# Patient Record
Sex: Female | Born: 1960
Health system: Southern US, Community
[De-identification: ages and names within clinical notes are randomized; demographics above are authoritative.]

## PROBLEM LIST (undated history)

## (undated) DIAGNOSIS — D509 Iron deficiency anemia, unspecified: Secondary | ICD-10-CM

## (undated) DIAGNOSIS — I6521 Occlusion and stenosis of right carotid artery: Secondary | ICD-10-CM

## (undated) DIAGNOSIS — C569 Malignant neoplasm of unspecified ovary: Secondary | ICD-10-CM

## (undated) DIAGNOSIS — E785 Hyperlipidemia, unspecified: Secondary | ICD-10-CM

## (undated) DIAGNOSIS — I10 Essential (primary) hypertension: Secondary | ICD-10-CM

## (undated) DIAGNOSIS — I8290 Acute embolism and thrombosis of unspecified vein: Secondary | ICD-10-CM

## (undated) DIAGNOSIS — J45909 Unspecified asthma, uncomplicated: Secondary | ICD-10-CM

## (undated) HISTORY — DX: Malignant neoplasm of unspecified ovary: C56.9

## (undated) HISTORY — DX: Essential (primary) hypertension: I10

## (undated) HISTORY — DX: Acute embolism and thrombosis of unspecified vein: I82.90

## (undated) HISTORY — DX: Iron deficiency anemia, unspecified: D50.9

## (undated) HISTORY — PX: TOTAL ABDOMINAL HYSTERECTOMY W/ BILATERAL SALPINGOOPHORECTOMY: SHX83

## (undated) HISTORY — PX: ILEOSTOMY: SHX1783

## (undated) HISTORY — DX: Hypomagnesemia: E83.42

## (undated) HISTORY — DX: Unspecified asthma, uncomplicated: J45.909

## (undated) HISTORY — PX: APPENDECTOMY: SHX54

## (undated) HISTORY — DX: Hyperlipidemia, unspecified: E78.5

## (undated) HISTORY — DX: Occlusion and stenosis of right carotid artery: I65.21

## (undated) HISTORY — PX: ABDOMINAL HYSTERECTOMY: SHX81

---

## 1997-12-24 ENCOUNTER — Other Ambulatory Visit: Admission: RE | Admit: 1997-12-24 | Discharge: 1997-12-24 | Payer: Self-pay | Admitting: Obstetrics and Gynecology

## 1998-12-29 ENCOUNTER — Other Ambulatory Visit: Admission: RE | Admit: 1998-12-29 | Discharge: 1998-12-29 | Payer: Self-pay | Admitting: Obstetrics and Gynecology

## 2000-02-28 ENCOUNTER — Other Ambulatory Visit: Admission: RE | Admit: 2000-02-28 | Discharge: 2000-02-28 | Payer: Self-pay | Admitting: Obstetrics and Gynecology

## 2005-11-03 ENCOUNTER — Emergency Department (HOSPITAL_COMMUNITY): Admission: EM | Admit: 2005-11-03 | Discharge: 2005-11-03 | Payer: Self-pay | Admitting: Emergency Medicine

## 2005-11-18 ENCOUNTER — Encounter: Admission: RE | Admit: 2005-11-18 | Discharge: 2005-11-18 | Payer: Self-pay | Admitting: Cardiovascular Disease

## 2010-01-08 ENCOUNTER — Ambulatory Visit: Payer: Self-pay | Admitting: Family Medicine

## 2010-01-08 ENCOUNTER — Ambulatory Visit: Payer: Self-pay | Admitting: Gynecologic Oncology

## 2010-01-12 ENCOUNTER — Ambulatory Visit: Payer: Self-pay | Admitting: Gynecologic Oncology

## 2010-02-08 ENCOUNTER — Ambulatory Visit: Payer: Self-pay | Admitting: Gynecologic Oncology

## 2010-09-20 IMAGING — CT CT ABD-PELV W/ CM
1 of 3 series · 13 of 32 positions shown, 19 images · non-contrast
Comparison: none

REASON FOR EXAM: CHRONIC ABD PAIN UTERINE MASS FOR STAGING CA
COMMENTS:

PROCEDURE:     CT  - CT ABDOMEN / PELVIS  W  - January 08, 2010  [DATE]
RESULT:     History: Pain.
Comparison Study: No prior.

[Series 2: soft tissue · axial · 0.77mm/px · z∈[-536,-108]mm · 13 of 165 slices shown, 19 images]
[im 11/165  soft-tissue]
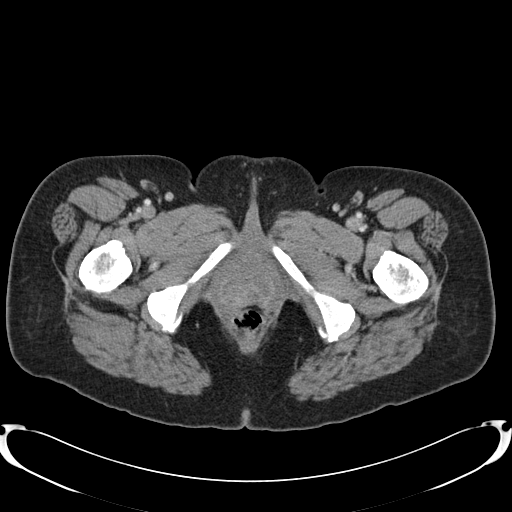
[im 11/165  bone]
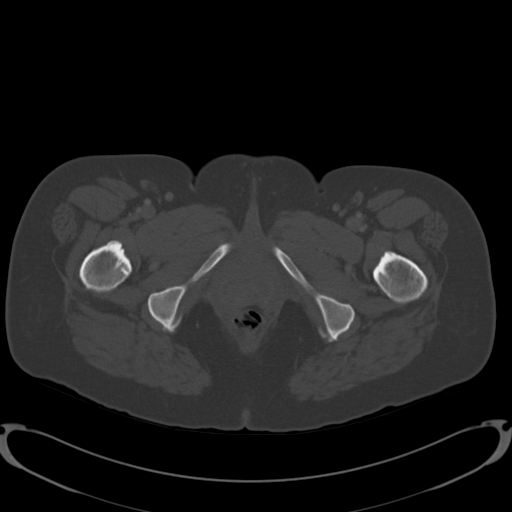
[im 22/165  soft-tissue]
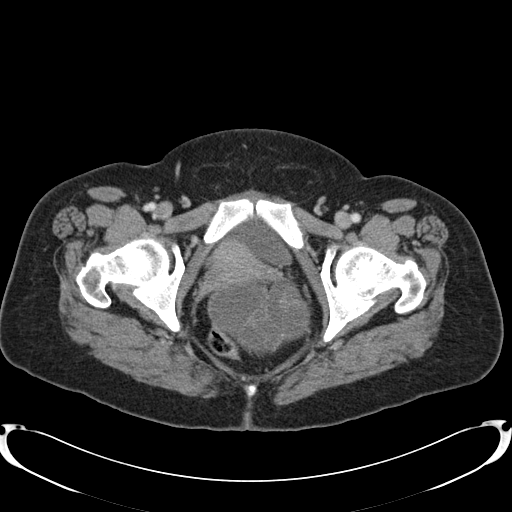
[im 33/165  soft-tissue]
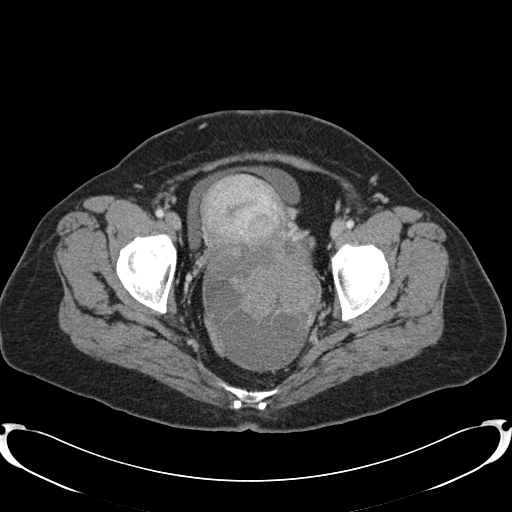
[im 44/165  soft-tissue]
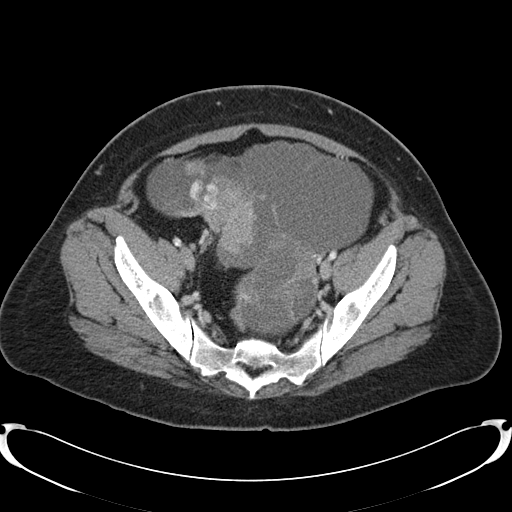
[im 55/165  soft-tissue]
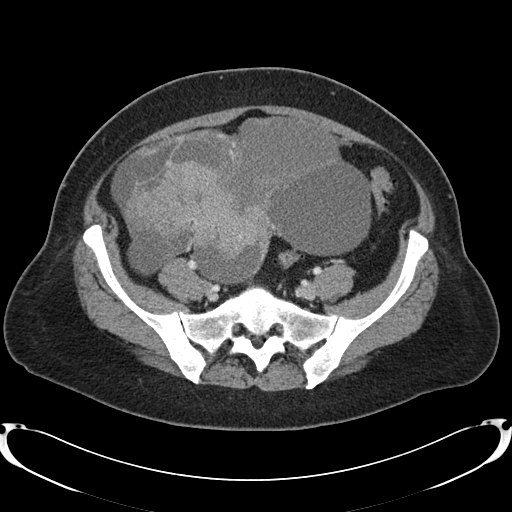
[im 66/165  soft-tissue]
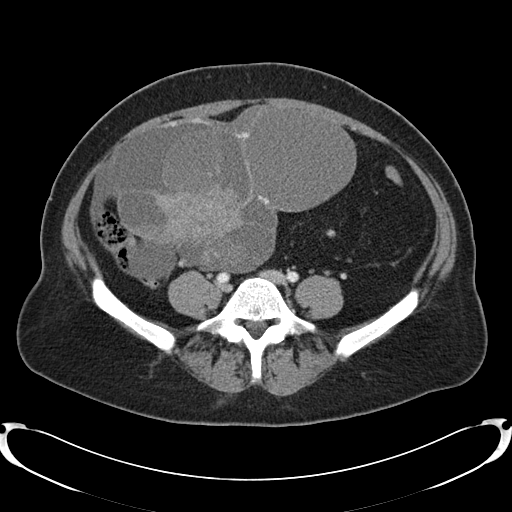
[im 88/165  soft-tissue]
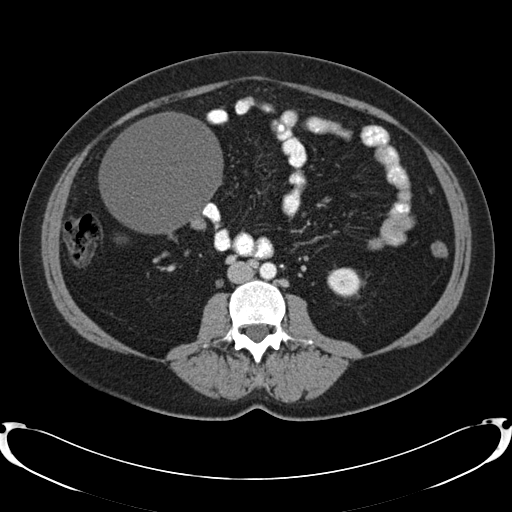
[im 99/165  soft-tissue]
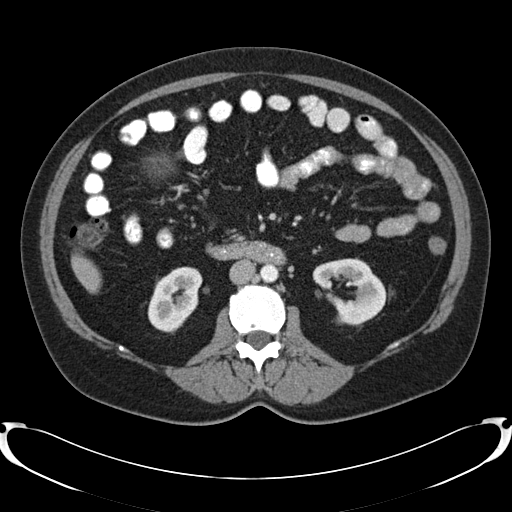
[im 110/165  soft-tissue]
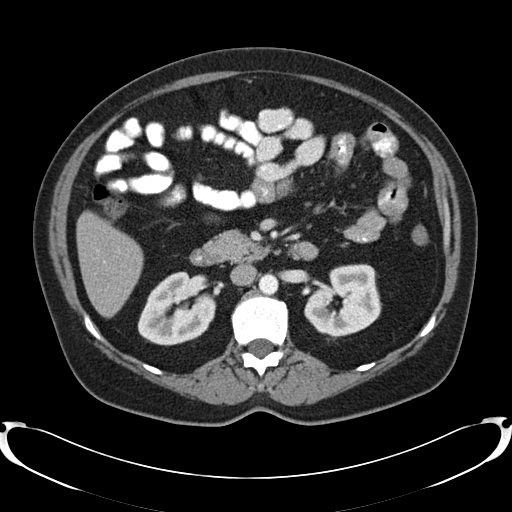
[im 110/165  bone]
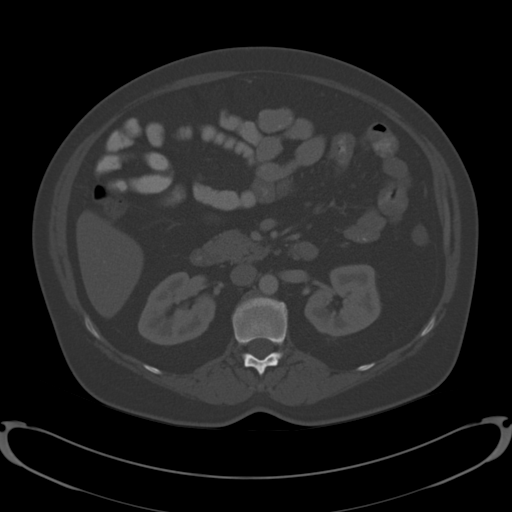
[im 121/165  soft-tissue]
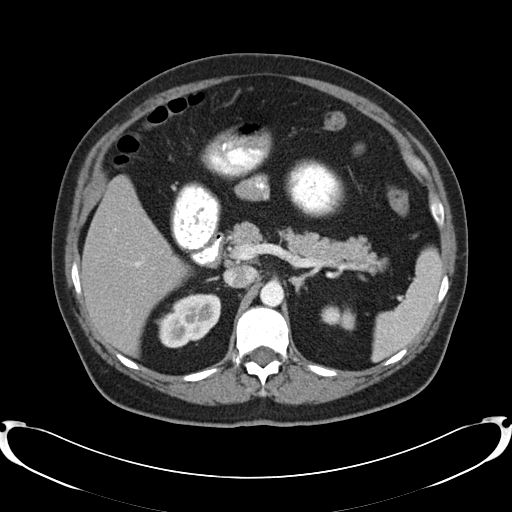
[im 121/165  lung]
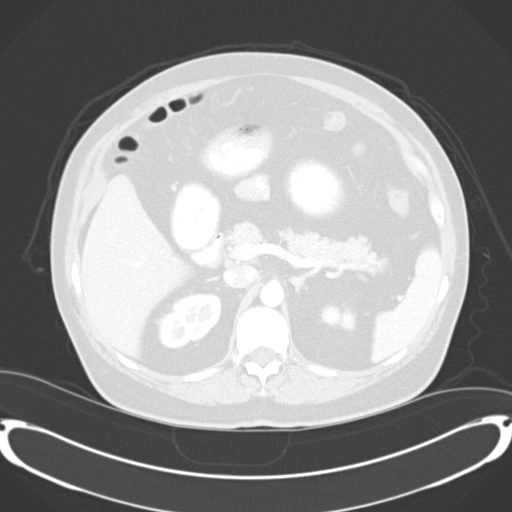
[im 132/165  soft-tissue]
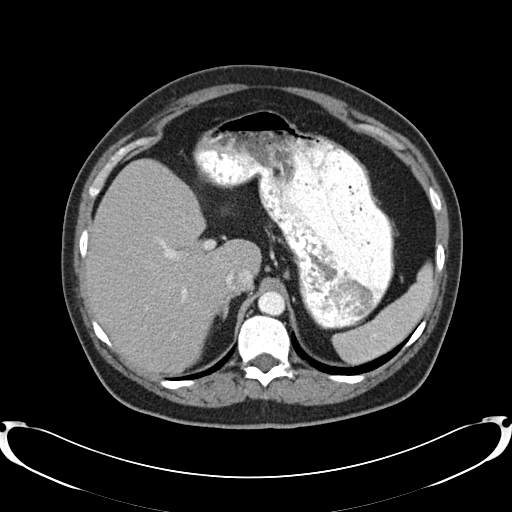
[im 132/165  lung]
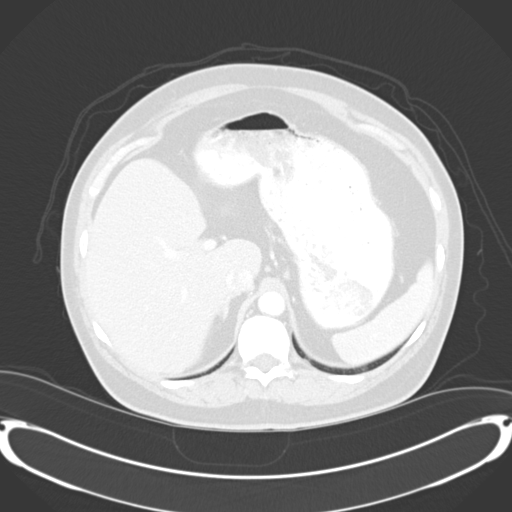
[im 143/165  soft-tissue]
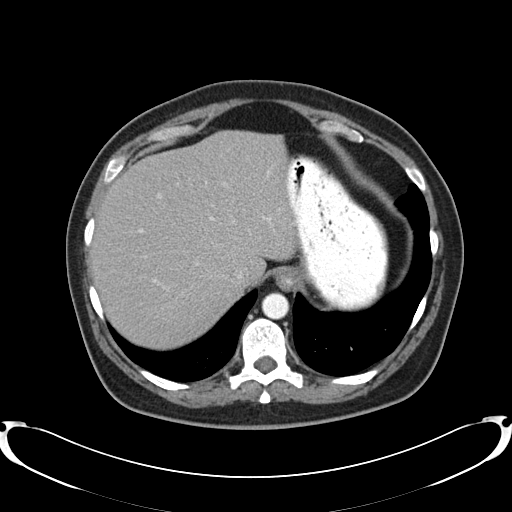
[im 143/165  lung]
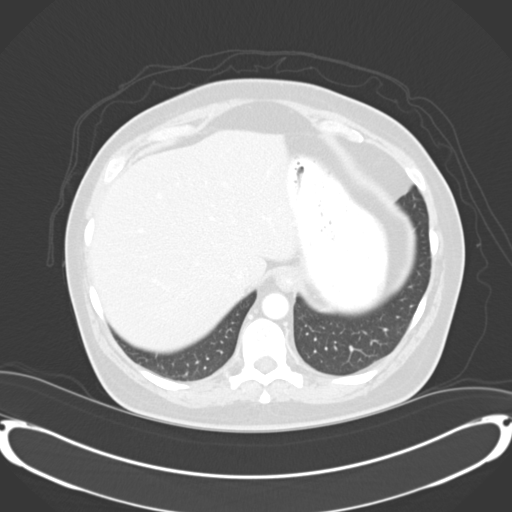
[im 154/165  soft-tissue]
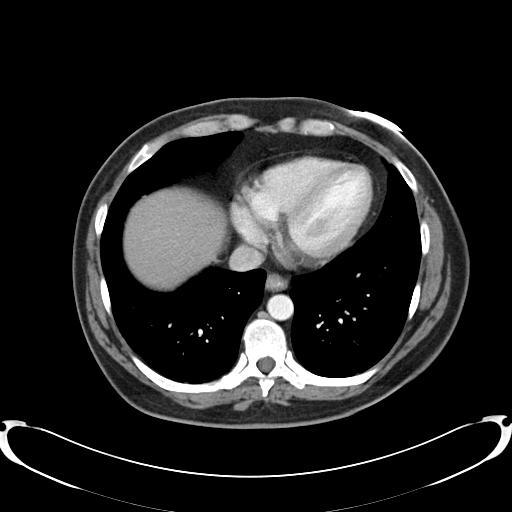
[im 154/165  lung]
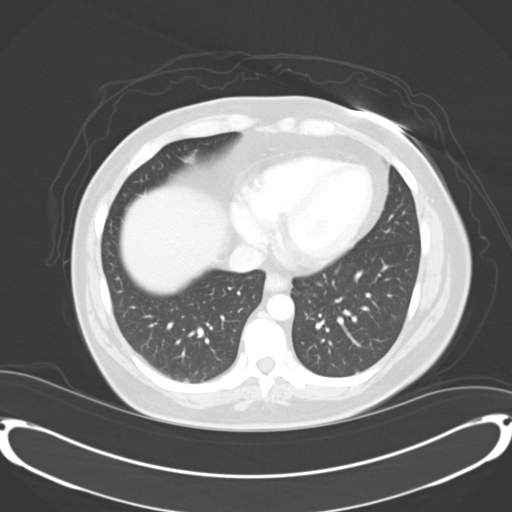

[13 of 32 positions shown; findings below may reference images not displayed]

FINDINGS: Following administration of 100 cc of 9sovue-8RM CT obtained.
Liver normal. Gallbladder nondistended. Spleen normal. Pancreas normal.
Adrenals normal. Kidneys normal. There is a large complex cystic mass
filling the entire lower abdomen and pelvis with large enhancing solid
components and multiple large cystic components with septations. This is
most likely a cystic tumor of ovarian origin. There is no resulting
hydronephrosis. No bowel obstruction.
IMPRESSION: Large complex cystic mass with enhancing solid components
and multiple  septated cysts occupying the entire lower abdomen and pelvis
most consistent with a cystic ovarian tumor. No evidence of retroperitoneal
adenopathy or hepatic metastatic disease. Lung bases clear.

## 2012-05-18 ENCOUNTER — Ambulatory Visit: Payer: Self-pay | Admitting: Gastroenterology

## 2013-01-09 DIAGNOSIS — C569 Malignant neoplasm of unspecified ovary: Secondary | ICD-10-CM | POA: Insufficient documentation

## 2014-08-21 DIAGNOSIS — I10 Essential (primary) hypertension: Secondary | ICD-10-CM | POA: Insufficient documentation

## 2015-01-05 ENCOUNTER — Other Ambulatory Visit: Payer: Self-pay | Admitting: Family Medicine

## 2015-01-07 ENCOUNTER — Encounter: Payer: Self-pay | Admitting: Family Medicine

## 2015-01-07 ENCOUNTER — Ambulatory Visit (INDEPENDENT_AMBULATORY_CARE_PROVIDER_SITE_OTHER): Payer: BC Managed Care – PPO | Admitting: Family Medicine

## 2015-01-07 VITALS — BP 157/85 | HR 90 | Temp 98.1°F | Ht 67.0 in | Wt 171.0 lb

## 2015-01-07 DIAGNOSIS — C569 Malignant neoplasm of unspecified ovary: Secondary | ICD-10-CM

## 2015-01-07 DIAGNOSIS — J45909 Unspecified asthma, uncomplicated: Secondary | ICD-10-CM | POA: Insufficient documentation

## 2015-01-07 DIAGNOSIS — I10 Essential (primary) hypertension: Secondary | ICD-10-CM

## 2015-01-07 LAB — CBC WITH DIFFERENTIAL/PLATELET
BASOS ABS: 0 10*3/uL (ref 0.0–0.2)
BASOS: 0 %
EOS (ABSOLUTE): 0.1 10*3/uL (ref 0.0–0.4)
Eos: 1 %
HEMATOCRIT: 37.1 % (ref 34.0–46.6)
HEMOGLOBIN: 12.4 g/dL (ref 11.1–15.9)
IMMATURE GRANS (ABS): 0 10*3/uL (ref 0.0–0.1)
IMMATURE GRANULOCYTES: 0 %
LYMPHS: 31 %
Lymphocytes Absolute: 2.6 10*3/uL (ref 0.7–3.1)
MCH: 31.6 pg (ref 26.6–33.0)
MCHC: 33.4 g/dL (ref 31.5–35.7)
MCV: 95 fL (ref 79–97)
MONOCYTES: 7 %
Monocytes Absolute: 0.6 10*3/uL (ref 0.1–0.9)
NEUTROS ABS: 5.1 10*3/uL (ref 1.4–7.0)
Neutrophils: 61 %
PLATELETS: 274 10*3/uL (ref 150–379)
RBC: 3.92 x10E6/uL (ref 3.77–5.28)
RDW: 15.5 % — AB (ref 12.3–15.4)
WBC: 8.5 10*3/uL (ref 3.4–10.8)

## 2015-01-07 LAB — COMPREHENSIVE METABOLIC PANEL
ALBUMIN: 3.9 g/dL (ref 3.5–5.5)
ALK PHOS: 90 IU/L (ref 39–117)
ALT: 27 IU/L (ref 0–32)
AST: 24 IU/L (ref 0–40)
Albumin/Globulin Ratio: 1.3 (ref 1.1–2.5)
BUN/Creatinine Ratio: 18 (ref 9–23)
BUN: 11 mg/dL (ref 6–24)
Bilirubin Total: <0.2 mg/dL (ref 0.0–1.2)
CO2: 25 mmol/L (ref 18–29)
CREATININE: 0.62 mg/dL (ref 0.57–1.00)
Calcium: 8.9 mg/dL (ref 8.7–10.2)
Chloride: 102 mmol/L (ref 97–108)
GFR calc Af Amer: 119 mL/min/{1.73_m2} (ref >59)
GFR, EST NON AFRICAN AMERICAN: 103 mL/min/{1.73_m2} (ref >59)
Globulin, Total: 3.1 g/dL (ref 1.5–4.5)
Glucose: 125 mg/dL — ABNORMAL HIGH (ref 65–99)
Potassium: 3.8 mmol/L (ref 3.5–5.2)
SODIUM: 139 mmol/L (ref 134–144)
Total Protein: 7 g/dL (ref 6.0–8.5)

## 2015-01-07 LAB — URINALYSIS, ROUTINE W REFLEX MICROSCOPIC
Bilirubin, UA: NEGATIVE
Glucose, UA: NEGATIVE
Ketones, UA: NEGATIVE
NITRITE UA: NEGATIVE
PH UA: 5.5 (ref 5.0–7.5)
PROTEIN UA: NEGATIVE
RBC UA: NEGATIVE
SPEC GRAV UA: 1.022 (ref 1.005–1.030)
Urobilinogen, Ur: 0.2 mg/dL (ref 0.2–1.0)

## 2015-01-07 LAB — MICROSCOPIC EXAMINATION
Bacteria, UA: NONE SEEN
Casts: NONE SEEN /lpf

## 2015-01-07 LAB — MAGNESIUM: Magnesium: 1.4 mg/dL — ABNORMAL LOW (ref 1.6–2.3)

## 2015-01-07 MED ORDER — LOSARTAN POTASSIUM 50 MG PO TABS
50.0000 mg | ORAL_TABLET | Freq: Two times a day (BID) | ORAL | Status: DC
Start: 2015-01-07 — End: 2016-03-24

## 2015-01-07 MED ORDER — AMLODIPINE BESYLATE 2.5 MG PO TABS: 2.5000 mg | ORAL_TABLET | Freq: Every day | ORAL | Status: DC

## 2015-01-07 NOTE — Assessment & Plan Note (Signed)
The current medical regimen is effective;  continue present plan and medications.  

## 2015-01-07 NOTE — Progress Notes (Signed)
BP 157/85 mmHg  Pulse 90  Temp(Src) 98.1 F (36.7 C)  Ht 5\' 7"  (1.702 m)  Wt 171 lb (77.565 kg)  BMI 26.78 kg/m2  SpO2 99%   Subjective:    Patient ID: Diane Acosta, female    DOB: [birth year], 54 y.o.   MRN: [id number]  HPI: Diane Acosta is a 54 y.o. female  Chief Complaint  Patient presents with  . Hypertension  does well but marked anxiety with chemo tx for cancer Well controled with relaxation and otherwise Takes every day   Relevant past medical, surgical, family and social history reviewed and updated as indicated. Interim medical history since our last visit reviewed. Allergies and medications reviewed and updated.  Review of Systems  Constitutional: Negative.   Respiratory: Negative.   Cardiovascular: Negative.     Per HPI unless specifically indicated above     Objective:    BP 157/85 mmHg  Pulse 90  Temp(Src) 98.1 F (36.7 C)  Ht 5\' 7"  (1.702 m)  Wt 171 lb (77.565 kg)  BMI 26.78 kg/m2  SpO2 99%  Wt Readings from Last 3 Encounters:  01/07/15 171 lb (77.565 kg)  07/21/14 172 lb (78.019 kg)    Physical Exam  Constitutional: She is oriented to person, place, and time. She appears well-developed and well-nourished. No distress.  HENT:  Head: Normocephalic and atraumatic.  Right Ear: Hearing normal.  Left Ear: Hearing normal.  Nose: Nose normal.  Eyes: Conjunctivae and lids are normal. Right eye exhibits no discharge. Left eye exhibits no discharge. No scleral icterus.  Cardiovascular: Normal rate, regular rhythm and normal heart sounds.   Pulmonary/Chest: Effort normal and breath sounds normal. No respiratory distress.  Musculoskeletal: Normal range of motion.  Neurological: She is alert and oriented to person, place, and time.  Skin: Skin is intact. No rash noted.  Psychiatric: She has a normal mood and affect. Her speech is normal and behavior is normal. Judgment and thought content normal. Cognition and memory are normal.    Results for  orders placed or performed in visit on 01/05/15  Microscopic Examination  Result Value Ref Range   WBC, UA 0-5 0 -  5 /hpf   RBC, UA 0-2 0 -  2 /hpf   Epithelial Cells (non renal) 0-10 0 - 10 /hpf   Casts None seen None seen /lpf   Mucus, UA Present Not Estab.   Bacteria, UA None seen None seen/Few  CBC with Differential/Platelet  Result Value Ref Range   WBC 8.5 3.4 - 10.8 x10E3/uL   RBC 3.92 3.77 - 5.28 x10E6/uL   Hemoglobin 12.4 11.1 - 15.9 g/dL   Hematocrit 37.1 34.0 - 46.6 %   MCV 95 79 - 97 fL   MCH 31.6 26.6 - 33.0 pg   MCHC 33.4 31.5 - 35.7 g/dL   RDW 15.5 (H) 12.3 - 15.4 %   Platelets 274 150 - 379 x10E3/uL   NEUTROPHILS 61 %   Lymphs 31 %   Monocytes 7 %   Eos 1 %   Basos 0 %   Neutrophils Absolute 5.1 1.4 - 7.0 x10E3/uL   Lymphocytes Absolute 2.6 0.7 - 3.1 x10E3/uL   Monocytes Absolute 0.6 0.1 - 0.9 x10E3/uL   EOS (ABSOLUTE) 0.1 0.0 - 0.4 x10E3/uL   Basophils Absolute 0.0 0.0 - 0.2 x10E3/uL   Immature Granulocytes 0 %   Immature Grans (Abs) 0.0 0.0 - 0.1 x10E3/uL  Urinalysis, Routine w reflex microscopic (not at [facility]  Result  Value Ref Range   Specific Gravity, UA 1.022 1.005 - 1.030   pH, UA 5.5 5.0 - 7.5   Color, UA Yellow Yellow   Appearance Ur Cloudy (A) Clear   Leukocytes, UA 1+ (A) Negative   Protein, UA Negative Negative/Trace   Glucose, UA Negative Negative   Ketones, UA Negative Negative   RBC, UA Negative Negative   Bilirubin, UA Negative Negative   Urobilinogen, Ur 0.2 0.2 - 1.0 mg/dL   Nitrite, UA Negative Negative   Microscopic Examination See below:       Assessment & Plan:   Problem List Items Addressed This Visit      Cardiovascular and Mediastinum   Essential (primary) hypertension - Primary    The current medical regimen is effective;  continue present plan and medications.       Relevant Medications   amLODipine (NORVASC) 2.5 MG tablet   losartan (COZAAR) 50 MG tablet     Genitourinary   Ovarian cancer    Stable with  chemo Needs blood workj each week which is done      Relevant Medications   promethazine (PROMETHEGAN) 25 MG suppository       Follow up plan: Return in about 6 months (around 07/09/2015) for Physical Exam.

## 2015-01-07 NOTE — Assessment & Plan Note (Signed)
Stable with chemo Needs blood workj each week which is done

## 2015-02-02 ENCOUNTER — Other Ambulatory Visit: Payer: Self-pay | Admitting: Family Medicine

## 2015-02-03 LAB — URINALYSIS, ROUTINE W REFLEX MICROSCOPIC
Bilirubin, UA: NEGATIVE
GLUCOSE, UA: NEGATIVE
KETONES UA: NEGATIVE
LEUKOCYTES UA: NEGATIVE
NITRITE UA: NEGATIVE
PH UA: 6 (ref 5.0–7.5)
Protein, UA: NEGATIVE
RBC, UA: NEGATIVE
Specific Gravity, UA: 1.006 (ref 1.005–1.030)
Urobilinogen, Ur: 0.2 mg/dL (ref 0.2–1.0)

## 2015-02-03 LAB — COMPREHENSIVE METABOLIC PANEL
A/G RATIO: 1.5 (ref 1.1–2.5)
ALBUMIN: 4.1 g/dL (ref 3.5–5.5)
ALT: 30 IU/L (ref 0–32)
AST: 26 IU/L (ref 0–40)
Alkaline Phosphatase: 83 IU/L (ref 39–117)
BILIRUBIN TOTAL: 0.3 mg/dL (ref 0.0–1.2)
BUN/Creatinine Ratio: 23 (ref 9–23)
BUN: 11 mg/dL (ref 6–24)
CO2: 18 mmol/L (ref 18–29)
CREATININE: 0.48 mg/dL — AB (ref 0.57–1.00)
Calcium: 8.9 mg/dL (ref 8.7–10.2)
Chloride: 99 mmol/L (ref 97–108)
GFR calc non Af Amer: 112 mL/min/{1.73_m2} (ref >59)
GFR, EST AFRICAN AMERICAN: 130 mL/min/{1.73_m2} (ref >59)
GLUCOSE: 94 mg/dL (ref 65–99)
Globulin, Total: 2.7 g/dL (ref 1.5–4.5)
Potassium: 4 mmol/L (ref 3.5–5.2)
SODIUM: 137 mmol/L (ref 134–144)
Total Protein: 6.8 g/dL (ref 6.0–8.5)

## 2015-02-03 LAB — CBC WITH DIFFERENTIAL/PLATELET
Basophils Absolute: 0 10*3/uL (ref 0.0–0.2)
Basos: 0 %
EOS (ABSOLUTE): 0.1 10*3/uL (ref 0.0–0.4)
EOS: 2 %
HEMOGLOBIN: 12.8 g/dL (ref 11.1–15.9)
Hematocrit: 39.8 % (ref 34.0–46.6)
IMMATURE GRANULOCYTES: 0 %
Immature Grans (Abs): 0 10*3/uL (ref 0.0–0.1)
LYMPHS ABS: 2.2 10*3/uL (ref 0.7–3.1)
LYMPHS: 33 %
MCH: 30.1 pg (ref 26.6–33.0)
MCHC: 32.2 g/dL (ref 31.5–35.7)
MCV: 94 fL (ref 79–97)
Monocytes Absolute: 0.6 10*3/uL (ref 0.1–0.9)
Monocytes: 9 %
NEUTROS ABS: 3.7 10*3/uL (ref 1.4–7.0)
NEUTROS PCT: 56 %
PLATELETS: 264 10*3/uL (ref 150–379)
RBC: 4.25 x10E6/uL (ref 3.77–5.28)
RDW: 15.2 % (ref 12.3–15.4)
WBC: 6.6 10*3/uL (ref 3.4–10.8)

## 2015-02-03 LAB — MAGNESIUM: MAGNESIUM: 1.4 mg/dL — AB (ref 1.6–2.3)

## 2015-02-23 ENCOUNTER — Other Ambulatory Visit: Payer: Self-pay | Admitting: Family Medicine

## 2015-02-24 LAB — CBC WITH DIFFERENTIAL/PLATELET
BASOS: 0 %
Basophils Absolute: 0 10*3/uL (ref 0.0–0.2)
EOS (ABSOLUTE): 0.2 10*3/uL (ref 0.0–0.4)
EOS: 3 %
HEMATOCRIT: 38.2 % (ref 34.0–46.6)
Hemoglobin: 12.7 g/dL (ref 11.1–15.9)
IMMATURE GRANS (ABS): 0 10*3/uL (ref 0.0–0.1)
IMMATURE GRANULOCYTES: 0 %
LYMPHS: 36 %
Lymphocytes Absolute: 3.2 10*3/uL — ABNORMAL HIGH (ref 0.7–3.1)
MCH: 29.7 pg (ref 26.6–33.0)
MCHC: 33.2 g/dL (ref 31.5–35.7)
MCV: 89 fL (ref 79–97)
MONOS ABS: 0.7 10*3/uL (ref 0.1–0.9)
Monocytes: 8 %
NEUTROS ABS: 4.8 10*3/uL (ref 1.4–7.0)
NEUTROS PCT: 53 %
Platelets: 301 10*3/uL (ref 150–379)
RBC: 4.28 x10E6/uL (ref 3.77–5.28)
RDW: 15.2 % (ref 12.3–15.4)
WBC: 9 10*3/uL (ref 3.4–10.8)

## 2015-02-24 LAB — URINALYSIS, ROUTINE W REFLEX MICROSCOPIC
Bilirubin, UA: NEGATIVE
Glucose, UA: NEGATIVE
Ketones, UA: NEGATIVE
LEUKOCYTES UA: NEGATIVE
Nitrite, UA: NEGATIVE
PH UA: 6.5 (ref 5.0–7.5)
PROTEIN UA: NEGATIVE
RBC, UA: NEGATIVE
Specific Gravity, UA: 1.019 (ref 1.005–1.030)
UUROB: 0.2 mg/dL (ref 0.2–1.0)

## 2015-02-24 LAB — COMPREHENSIVE METABOLIC PANEL
A/G RATIO: 1.5 (ref 1.1–2.5)
ALT: 27 IU/L (ref 0–32)
AST: 21 IU/L (ref 0–40)
Albumin: 4.1 g/dL (ref 3.5–5.5)
Alkaline Phosphatase: 91 IU/L (ref 39–117)
BILIRUBIN TOTAL: 0.3 mg/dL (ref 0.0–1.2)
BUN / CREAT RATIO: 17 (ref 9–23)
BUN: 11 mg/dL (ref 6–24)
CALCIUM: 9.4 mg/dL (ref 8.7–10.2)
CO2: 22 mmol/L (ref 18–29)
Chloride: 99 mmol/L (ref 97–108)
Creatinine, Ser: 0.64 mg/dL (ref 0.57–1.00)
GFR, EST AFRICAN AMERICAN: 118 mL/min/{1.73_m2} (ref >59)
GFR, EST NON AFRICAN AMERICAN: 102 mL/min/{1.73_m2} (ref >59)
GLOBULIN, TOTAL: 2.7 g/dL (ref 1.5–4.5)
Glucose: 91 mg/dL (ref 65–99)
POTASSIUM: 4.3 mmol/L (ref 3.5–5.2)
SODIUM: 138 mmol/L (ref 134–144)
TOTAL PROTEIN: 6.8 g/dL (ref 6.0–8.5)

## 2015-02-24 LAB — MAGNESIUM: MAGNESIUM: 1.6 mg/dL (ref 1.6–2.3)

## 2015-03-02 ENCOUNTER — Encounter: Payer: Self-pay | Admitting: Family Medicine

## 2015-04-02 ENCOUNTER — Telehealth: Payer: Self-pay | Admitting: Family Medicine

## 2015-04-02 MED ORDER — SULFAMETHOXAZOLE-TRIMETHOPRIM 800-160 MG PO TABS: 1.0000 | ORAL_TABLET | Freq: Two times a day (BID) | ORAL | Status: DC

## 2015-04-02 NOTE — Telephone Encounter (Signed)
Pt called stated she has a poss bladder infection, pt is out of town, wants to know if something can be sent in. No appointments available. Pharm is [facility] in [place] patient. Pt currently in funeral procession. Thanks.

## 2015-04-09 ENCOUNTER — Telehealth: Payer: Self-pay

## 2015-04-09 NOTE — Telephone Encounter (Signed)
Patient called as she is worried because she inadvertently took her husbands BP medication instead of her own.  She stated that her husband takes a higher dosage and that she has a cancer treatment scheduled for today. Patient wants to be sure that she will be okay. I advised the patient that I would send Dr. Jeananne Rama a message to call her ASAP.

## 2015-04-09 NOTE — Telephone Encounter (Signed)
Phone call Discussed with patient patient took her husband's amlodipine 10 mg she usually takes 2.5 mg of amlodipine and Lotensin. Discussed Will hold Lotensin today increased salt water and drink lots of water observe blood pressure.

## 2015-04-27 ENCOUNTER — Other Ambulatory Visit: Payer: BC Managed Care – PPO

## 2015-04-27 DIAGNOSIS — C569 Malignant neoplasm of unspecified ovary: Secondary | ICD-10-CM

## 2015-04-28 ENCOUNTER — Encounter: Payer: Self-pay | Admitting: Family Medicine

## 2015-04-28 LAB — CBC WITH DIFFERENTIAL/PLATELET
Basophils Absolute: 0.1 10*3/uL (ref 0.0–0.2)
Basos: 1 %
EOS (ABSOLUTE): 0.3 10*3/uL (ref 0.0–0.4)
EOS: 4 %
HEMATOCRIT: 39.5 % (ref 34.0–46.6)
Hemoglobin: 13.1 g/dL (ref 11.1–15.9)
Immature Grans (Abs): 0 10*3/uL (ref 0.0–0.1)
Immature Granulocytes: 0 %
LYMPHS ABS: 2.9 10*3/uL (ref 0.7–3.1)
Lymphs: 36 %
MCH: 29.4 pg (ref 26.6–33.0)
MCHC: 33.2 g/dL (ref 31.5–35.7)
MCV: 89 fL (ref 79–97)
MONOS ABS: 0.7 10*3/uL (ref 0.1–0.9)
Monocytes: 8 %
Neutrophils Absolute: 4.1 10*3/uL (ref 1.4–7.0)
Neutrophils: 51 %
Platelets: 301 10*3/uL (ref 150–379)
RBC: 4.46 x10E6/uL (ref 3.77–5.28)
RDW: 14.1 % (ref 12.3–15.4)
WBC: 8.1 10*3/uL (ref 3.4–10.8)

## 2015-04-28 LAB — COMPREHENSIVE METABOLIC PANEL
A/G RATIO: 1.3 (ref 1.1–2.5)
ALK PHOS: 100 IU/L (ref 39–117)
ALT: 27 IU/L (ref 0–32)
AST: 25 IU/L (ref 0–40)
Albumin: 4 g/dL (ref 3.5–5.5)
BUN/Creatinine Ratio: 21 (ref 9–23)
BUN: 12 mg/dL (ref 6–24)
Bilirubin Total: 0.3 mg/dL (ref 0.0–1.2)
CHLORIDE: 100 mmol/L (ref 97–106)
CO2: 23 mmol/L (ref 18–29)
Calcium: 9.3 mg/dL (ref 8.7–10.2)
Creatinine, Ser: 0.57 mg/dL (ref 0.57–1.00)
GFR calc Af Amer: 122 mL/min/{1.73_m2} (ref >59)
GFR calc non Af Amer: 106 mL/min/{1.73_m2} (ref >59)
GLOBULIN, TOTAL: 3.2 g/dL (ref 1.5–4.5)
Glucose: 90 mg/dL (ref 65–99)
POTASSIUM: 4.6 mmol/L (ref 3.5–5.2)
SODIUM: 140 mmol/L (ref 136–144)
Total Protein: 7.2 g/dL (ref 6.0–8.5)

## 2015-04-28 LAB — UA/M W/RFLX CULTURE, ROUTINE
Bilirubin, UA: NEGATIVE
Glucose, UA: NEGATIVE
Ketones, UA: NEGATIVE
LEUKOCYTES UA: NEGATIVE
Nitrite, UA: NEGATIVE
PH UA: 5.5 (ref 5.0–7.5)
PROTEIN UA: NEGATIVE
RBC, UA: NEGATIVE
SPEC GRAV UA: 1.018 (ref 1.005–1.030)
Urobilinogen, Ur: 0.2 mg/dL (ref 0.2–1.0)

## 2015-04-28 LAB — MICROSCOPIC EXAMINATION: CASTS: NONE SEEN /LPF

## 2015-04-28 LAB — MAGNESIUM: MAGNESIUM: 1.5 mg/dL — AB (ref 1.6–2.3)

## 2015-05-18 ENCOUNTER — Other Ambulatory Visit: Payer: Self-pay | Admitting: Family Medicine

## 2015-05-18 ENCOUNTER — Other Ambulatory Visit: Payer: BC Managed Care – PPO

## 2015-05-18 DIAGNOSIS — I10 Essential (primary) hypertension: Secondary | ICD-10-CM

## 2015-05-18 DIAGNOSIS — C569 Malignant neoplasm of unspecified ovary: Secondary | ICD-10-CM

## 2015-05-18 LAB — MICROSCOPIC EXAMINATION: WBC, UA: >30 /hpf — ABNORMAL HIGH (ref 0–?)

## 2015-05-18 MED ORDER — SULFAMETHOXAZOLE-TRIMETHOPRIM 800-160 MG PO TABS: 1.0000 | ORAL_TABLET | Freq: Two times a day (BID) | ORAL | Status: DC

## 2015-05-18 MED ORDER — AMLODIPINE BESYLATE 2.5 MG PO TABS: 2.5000 mg | ORAL_TABLET | Freq: Two times a day (BID) | ORAL | Status: DC

## 2015-05-18 NOTE — Progress Notes (Signed)
Phone call Discussed with patient urine tract infection will start medications Patient never finished Septra from previous Also changed to amlodipine to 2.5 mg twice a day his appetite she is taking.

## 2015-05-19 ENCOUNTER — Other Ambulatory Visit: Payer: Self-pay | Admitting: Family Medicine

## 2015-05-19 ENCOUNTER — Encounter: Payer: Self-pay | Admitting: Family Medicine

## 2015-05-19 LAB — CBC WITH DIFFERENTIAL/PLATELET
BASOS ABS: 0 10*3/uL (ref 0.0–0.2)
Basos: 0 %
EOS (ABSOLUTE): 0.3 10*3/uL (ref 0.0–0.4)
Eos: 3 %
HEMATOCRIT: 42.6 % (ref 34.0–46.6)
HEMOGLOBIN: 13.8 g/dL (ref 11.1–15.9)
IMMATURE GRANS (ABS): 0 10*3/uL (ref 0.0–0.1)
Immature Granulocytes: 0 %
LYMPHS ABS: 2.9 10*3/uL (ref 0.7–3.1)
Lymphs: 33 %
MCH: 29.4 pg (ref 26.6–33.0)
MCHC: 32.4 g/dL (ref 31.5–35.7)
MCV: 91 fL (ref 79–97)
MONOS ABS: 0.5 10*3/uL (ref 0.1–0.9)
Monocytes: 6 %
NEUTROS ABS: 5 10*3/uL (ref 1.4–7.0)
Neutrophils: 58 %
Platelets: 286 10*3/uL (ref 150–379)
RBC: 4.69 x10E6/uL (ref 3.77–5.28)
RDW: 14.3 % (ref 12.3–15.4)
WBC: 8.7 10*3/uL (ref 3.4–10.8)

## 2015-05-19 LAB — COMPREHENSIVE METABOLIC PANEL
ALBUMIN: 4.2 g/dL (ref 3.5–5.5)
ALT: 28 IU/L (ref 0–32)
AST: 22 IU/L (ref 0–40)
Albumin/Globulin Ratio: 1.4 (ref 1.1–2.5)
Alkaline Phosphatase: 104 IU/L (ref 39–117)
BUN / CREAT RATIO: 19 (ref 9–23)
BUN: 11 mg/dL (ref 6–24)
Bilirubin Total: 0.4 mg/dL (ref 0.0–1.2)
CO2: 24 mmol/L (ref 18–29)
CREATININE: 0.59 mg/dL (ref 0.57–1.00)
Calcium: 9.5 mg/dL (ref 8.7–10.2)
Chloride: 98 mmol/L (ref 97–106)
GFR calc non Af Amer: 105 mL/min/{1.73_m2} (ref >59)
GFR, EST AFRICAN AMERICAN: 121 mL/min/{1.73_m2} (ref >59)
GLOBULIN, TOTAL: 3.1 g/dL (ref 1.5–4.5)
GLUCOSE: 116 mg/dL — AB (ref 65–99)
Potassium: 4.3 mmol/L (ref 3.5–5.2)
Sodium: 137 mmol/L (ref 136–144)
TOTAL PROTEIN: 7.3 g/dL (ref 6.0–8.5)

## 2015-05-19 LAB — MAGNESIUM: Magnesium: 1.6 mg/dL (ref 1.6–2.3)

## 2015-05-20 LAB — URINE CULTURE, REFLEX

## 2015-05-21 LAB — UA/M W/RFLX CULTURE, ROUTINE

## 2015-06-08 ENCOUNTER — Other Ambulatory Visit: Payer: BC Managed Care – PPO

## 2015-06-08 DIAGNOSIS — C569 Malignant neoplasm of unspecified ovary: Secondary | ICD-10-CM

## 2015-06-09 ENCOUNTER — Encounter: Payer: Self-pay | Admitting: Family Medicine

## 2015-06-09 LAB — URINALYSIS, ROUTINE W REFLEX MICROSCOPIC
BILIRUBIN UA: NEGATIVE
Glucose, UA: NEGATIVE
Ketones, UA: NEGATIVE
Leukocytes, UA: NEGATIVE
NITRITE UA: NEGATIVE
PH UA: 5 (ref 5.0–7.5)
PROTEIN UA: NEGATIVE
RBC UA: NEGATIVE
Specific Gravity, UA: 1.014 (ref 1.005–1.030)
UUROB: 0.2 mg/dL (ref 0.2–1.0)

## 2015-06-09 LAB — COMPREHENSIVE METABOLIC PANEL
ALBUMIN: 4.1 g/dL (ref 3.5–5.5)
ALT: 24 IU/L (ref 0–32)
AST: 21 IU/L (ref 0–40)
Albumin/Globulin Ratio: 1.4 (ref 1.1–2.5)
Alkaline Phosphatase: 98 IU/L (ref 39–117)
BILIRUBIN TOTAL: 0.3 mg/dL (ref 0.0–1.2)
BUN/Creatinine Ratio: 20 (ref 9–23)
BUN: 13 mg/dL (ref 6–24)
CALCIUM: 9.4 mg/dL (ref 8.7–10.2)
CHLORIDE: 99 mmol/L (ref 97–106)
CO2: 21 mmol/L (ref 18–29)
CREATININE: 0.64 mg/dL (ref 0.57–1.00)
GFR, EST AFRICAN AMERICAN: 118 mL/min/{1.73_m2} (ref >59)
GFR, EST NON AFRICAN AMERICAN: 102 mL/min/{1.73_m2} (ref >59)
GLUCOSE: 107 mg/dL — AB (ref 65–99)
Globulin, Total: 3 g/dL (ref 1.5–4.5)
Potassium: 4 mmol/L (ref 3.5–5.2)
Sodium: 139 mmol/L (ref 136–144)
TOTAL PROTEIN: 7.1 g/dL (ref 6.0–8.5)

## 2015-06-09 LAB — CBC WITH DIFFERENTIAL/PLATELET
BASOS ABS: 0 10*3/uL (ref 0.0–0.2)
BASOS: 0 %
EOS (ABSOLUTE): 0.2 10*3/uL (ref 0.0–0.4)
Eos: 3 %
HEMATOCRIT: 40.9 % (ref 34.0–46.6)
HEMOGLOBIN: 13.9 g/dL (ref 11.1–15.9)
Immature Grans (Abs): 0 10*3/uL (ref 0.0–0.1)
Immature Granulocytes: 0 %
LYMPHS ABS: 2.7 10*3/uL (ref 0.7–3.1)
Lymphs: 32 %
MCH: 30 pg (ref 26.6–33.0)
MCHC: 34 g/dL (ref 31.5–35.7)
MCV: 88 fL (ref 79–97)
MONOCYTES: 6 %
Monocytes Absolute: 0.5 10*3/uL (ref 0.1–0.9)
NEUTROS ABS: 5 10*3/uL (ref 1.4–7.0)
Neutrophils: 59 %
PLATELETS: 273 10*3/uL (ref 150–379)
RBC: 4.64 x10E6/uL (ref 3.77–5.28)
RDW: 13.9 % (ref 12.3–15.4)
WBC: 8.5 10*3/uL (ref 3.4–10.8)

## 2015-06-09 LAB — MAGNESIUM: Magnesium: 1.5 mg/dL — ABNORMAL LOW (ref 1.6–2.3)

## 2015-06-29 ENCOUNTER — Other Ambulatory Visit: Payer: BC Managed Care – PPO

## 2015-07-07 ENCOUNTER — Other Ambulatory Visit: Payer: BC Managed Care – PPO

## 2015-07-07 DIAGNOSIS — C569 Malignant neoplasm of unspecified ovary: Secondary | ICD-10-CM

## 2015-07-08 LAB — CBC WITH DIFFERENTIAL/PLATELET
BASOS: 0 %
Basophils Absolute: 0 10*3/uL (ref 0.0–0.2)
EOS (ABSOLUTE): 0.5 10*3/uL — AB (ref 0.0–0.4)
EOS: 6 %
HEMATOCRIT: 42.1 % (ref 34.0–46.6)
Hemoglobin: 13.6 g/dL (ref 11.1–15.9)
IMMATURE GRANS (ABS): 0 10*3/uL (ref 0.0–0.1)
IMMATURE GRANULOCYTES: 0 %
LYMPHS: 39 %
Lymphocytes Absolute: 3.7 10*3/uL — ABNORMAL HIGH (ref 0.7–3.1)
MCH: 28.7 pg (ref 26.6–33.0)
MCHC: 32.3 g/dL (ref 31.5–35.7)
MCV: 89 fL (ref 79–97)
MONOS ABS: 0.6 10*3/uL (ref 0.1–0.9)
Monocytes: 7 %
NEUTROS ABS: 4.6 10*3/uL (ref 1.4–7.0)
NEUTROS PCT: 48 %
PLATELETS: 277 10*3/uL (ref 150–379)
RBC: 4.74 x10E6/uL (ref 3.77–5.28)
RDW: 14.2 % (ref 12.3–15.4)
WBC: 9.5 10*3/uL (ref 3.4–10.8)

## 2015-07-08 LAB — SPECIMEN STATUS REPORT

## 2015-07-14 ENCOUNTER — Ambulatory Visit (INDEPENDENT_AMBULATORY_CARE_PROVIDER_SITE_OTHER): Payer: BC Managed Care – PPO | Admitting: Family Medicine

## 2015-07-14 ENCOUNTER — Encounter: Payer: Self-pay | Admitting: Family Medicine

## 2015-07-14 VITALS — BP 145/79 | HR 109 | Temp 98.0°F | Ht 66.3 in | Wt 170.0 lb

## 2015-07-14 DIAGNOSIS — R079 Chest pain, unspecified: Secondary | ICD-10-CM | POA: Insufficient documentation

## 2015-07-14 DIAGNOSIS — C569 Malignant neoplasm of unspecified ovary: Secondary | ICD-10-CM | POA: Diagnosis not present

## 2015-07-14 DIAGNOSIS — I10 Essential (primary) hypertension: Secondary | ICD-10-CM

## 2015-07-14 DIAGNOSIS — T451X5A Adverse effect of antineoplastic and immunosuppressive drugs, initial encounter: Secondary | ICD-10-CM | POA: Diagnosis not present

## 2015-07-14 DIAGNOSIS — G620 Drug-induced polyneuropathy: Secondary | ICD-10-CM

## 2015-07-14 LAB — COMPREHENSIVE METABOLIC PANEL
ALT: 23 IU/L (ref 0–32)
AST: 21 IU/L (ref 0–40)
Albumin/Globulin Ratio: 1.3 (ref 1.1–2.5)
Albumin: 3.9 g/dL (ref 3.5–5.5)
Alkaline Phosphatase: 105 IU/L (ref 39–117)
BUN/Creatinine Ratio: 19 (ref 9–23)
BUN: 12 mg/dL (ref 6–24)
Bilirubin Total: 0.3 mg/dL (ref 0.0–1.2)
CALCIUM: 9.2 mg/dL (ref 8.7–10.2)
CO2: 23 mmol/L (ref 18–29)
CREATININE: 0.63 mg/dL (ref 0.57–1.00)
Chloride: 96 mmol/L (ref 96–106)
GFR calc Af Amer: 118 mL/min/{1.73_m2} (ref >59)
GFR, EST NON AFRICAN AMERICAN: 103 mL/min/{1.73_m2} (ref >59)
GLOBULIN, TOTAL: 3 g/dL (ref 1.5–4.5)
Glucose: 88 mg/dL (ref 65–99)
Potassium: 3.9 mmol/L (ref 3.5–5.2)
Sodium: 138 mmol/L (ref 134–144)
TOTAL PROTEIN: 6.9 g/dL (ref 6.0–8.5)

## 2015-07-14 LAB — URINALYSIS, ROUTINE W REFLEX MICROSCOPIC
BILIRUBIN UA: NEGATIVE
Glucose, UA: NEGATIVE
Ketones, UA: NEGATIVE
Leukocytes, UA: NEGATIVE
Nitrite, UA: NEGATIVE
PH UA: 5.5 (ref 5.0–7.5)
Protein, UA: NEGATIVE
RBC UA: NEGATIVE
Specific Gravity, UA: 1.023 (ref 1.005–1.030)
UUROB: 0.2 mg/dL (ref 0.2–1.0)

## 2015-07-14 LAB — MAGNESIUM: MAGNESIUM: 1.8 mg/dL (ref 1.6–2.3)

## 2015-07-14 NOTE — Progress Notes (Signed)
BP 145/79 mmHg  Pulse 109  Temp(Src) 98 F (36.7 C)  Ht 5' 6.3" (1.684 m)  Wt 170 lb (77.111 kg)  BMI 27.19 kg/m2  SpO2 99%   Subjective:    Patient ID: Diane Acosta, female    DOB: [birth year], 55 y.o.   MRN: [id number]  HPI: Diane Acosta is a 55 y.o. female  Chief Complaint  Patient presents with  . Annual Exam   deferred physical until later due to below. Patient scheduled for annual exam today but complaining of chest pain chest tightness sometimes comes on with exertion and sometimes not patient is been under a great deal of stress Patient has had nausea but taking medication that has that side effect no real diaphoresis has had some left fourth finger numbness No neck radiation  Reviewed labs as noted here in the chart. Lipids had previously been normal on review She is taking avastin for ovarian cancer has taking 12 treatments Worrisome is noted side effects of angina and MI from avastin Reviewed oncology notes Relevant past medical, surgical, family and social history reviewed and updated as indicated. Interim medical history since our last visit reviewed. Allergies and medications reviewed and updated.  Review of Systems  Constitutional: Positive for fatigue. Negative for fever, chills, diaphoresis and unexpected weight change.  HENT: Negative.   Eyes: Negative.   Respiratory: Positive for chest tightness and shortness of breath. Negative for cough, choking, wheezing and stridor.   Cardiovascular: Positive for chest pain. Negative for palpitations and leg swelling.  Gastrointestinal: Positive for nausea. Negative for vomiting, abdominal pain, diarrhea, constipation, blood in stool, abdominal distention and rectal pain.  Endocrine: Negative.   Genitourinary: Negative.   Musculoskeletal: Negative.   Neurological: Positive for numbness. Negative for seizures and syncope.       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  Does not bruise/bleed easily.  Psychiatric/Behavioral: Negative for suicidal ideas, behavioral problems, confusion and agitation. The patient is nervous/anxious.     Per HPI unless specifically indicated above     Objective:    BP 145/79 mmHg  Pulse 109  Temp(Src) 98 F (36.7 C)  Ht 5' 6.3" (1.684 m)  Wt 170 lb (77.111 kg)  BMI 27.19 kg/m2  SpO2 99%  Wt Readings from Last 3 Encounters:  07/14/15 170 lb (77.111 kg)  01/07/15 171 lb (77.565 kg)  07/21/14 172 lb (78.019 kg)    Physical Exam  Constitutional: She is oriented to person, place, and time. She appears well-developed and well-nourished. No distress.  HENT:  Head: Normocephalic and atraumatic.  Right Ear: Hearing normal.  Left Ear: Hearing normal.  Nose: Nose normal.  Eyes: Conjunctivae and lids are normal. Right eye exhibits no discharge. Left eye exhibits no discharge. No scleral icterus.  Neck: Normal range of motion. No thyromegaly present.  No bruits  Cardiovascular: Normal rate, regular rhythm and normal heart sounds.   Pulmonary/Chest: Effort normal and breath sounds normal. No respiratory distress.  Abdominal: Soft.  Musculoskeletal: Normal range of motion.  Neurological: She is alert and oriented to person, place, and time. Coordination normal.  Skin: Skin is warm, dry and intact. No rash noted.  Psychiatric: She has a normal mood and affect. Her speech is normal and behavior is normal. Judgment and thought content normal. Cognition and memory are normal.   EKG normal sinus rhythm with no acute change  Results for orders placed or performed in visit on 07/07/15  Magnesium  Result  Value Ref Range   Magnesium 1.8 1.6 - 2.3 mg/dL  Comprehensive metabolic panel  Result Value Ref Range   Glucose 88 65 - 99 mg/dL   BUN 12 6 - 24 mg/dL   Creatinine, Ser 0.63 0.57 - 1.00 mg/dL   GFR calc non Af Amer 103 >59 mL/min/1.73   GFR calc Af Amer 118 >59 mL/min/1.73   BUN/Creatinine Ratio 19 9 - 23   Sodium 138 134  - 144 mmol/L   Potassium 3.9 3.5 - 5.2 mmol/L   Chloride 96 96 - 106 mmol/L   CO2 23 18 - 29 mmol/L   Calcium 9.2 8.7 - 10.2 mg/dL   Total Protein 6.9 6.0 - 8.5 g/dL   Albumin 3.9 3.5 - 5.5 g/dL   Globulin, Total 3.0 1.5 - 4.5 g/dL   Albumin/Globulin Ratio 1.3 1.1 - 2.5   Bilirubin Total 0.3 0.0 - 1.2 mg/dL   Alkaline Phosphatase 105 39 - 117 IU/L   AST 21 0 - 40 IU/L   ALT 23 0 - 32 IU/L  Urinalysis, Routine w reflex microscopic (not at [facility])  Result Value Ref Range   Specific Gravity, UA 1.023 1.005 - 1.030   pH, UA 5.5 5.0 - 7.5   Color, UA Yellow Yellow   Appearance Ur Cloudy (A) Clear   Leukocytes, UA Negative Negative   Protein, UA Negative Negative/Trace   Glucose, UA Negative Negative   Ketones, UA Negative Negative   RBC, UA Negative Negative   Bilirubin, UA Negative Negative   Urobilinogen, Ur 0.2 0.2 - 1.0 mg/dL   Nitrite, UA Negative Negative   Microscopic Examination Comment   CBC with Differential/Platelet  Result Value Ref Range   WBC 9.5 3.4 - 10.8 x10E3/uL   RBC 4.74 3.77 - 5.28 x10E6/uL   Hemoglobin 13.6 11.1 - 15.9 g/dL   Hematocrit 42.1 34.0 - 46.6 %   MCV 89 79 - 97 fL   MCH 28.7 26.6 - 33.0 pg   MCHC 32.3 31.5 - 35.7 g/dL   RDW 14.2 12.3 - 15.4 %   Platelets 277 150 - 379 x10E3/uL   Neutrophils 48 %   Lymphs 39 %   Monocytes 7 %   Eos 6 %   Basos 0 %   Neutrophils Absolute 4.6 1.4 - 7.0 x10E3/uL   Lymphocytes Absolute 3.7 (H) 0.7 - 3.1 x10E3/uL   Monocytes Absolute 0.6 0.1 - 0.9 x10E3/uL   EOS (ABSOLUTE) 0.5 (H) 0.0 - 0.4 x10E3/uL   Basophils Absolute 0.0 0.0 - 0.2 x10E3/uL   Immature Granulocytes 0 %   Immature Grans (Abs) 0.0 0.0 - 0.1 x10E3/uL  Specimen status report  Result Value Ref Range   specimen status report Comment       Assessment & Plan:   Problem List Items Addressed This Visit      Cardiovascular and Mediastinum   Essential (primary) hypertension    Blood pressure modestly elevated today Patient goes up and down on  her amlodipine 2.5 according to her blood pressure response Pt always has higher blood pressure going into [facility] for chemotherapy and takes an extra to control her blood pressure. On review hypertension as a side effect of avastin        Nervous and Auditory   Peripheral neuropathy due to chemotherapy [facility])     Genitourinary   Ovarian cancer ([place])    On active therapy. Patient stable for now but with a lot of potential for side effects.  Other   Chest pain - Primary    For chest pain will make referral to cardiology to further evaluate Reviewed old records of significance no prior cardiac history Reviewed labs which are normal patient wanted to wait until her next regular blood draw for any further labs Discuss calling 911 use of aspirin and concern about this being a severe side effect from her medication with life-threatening potential.      Relevant Orders   EKG 12-Lead (Completed)   Ambulatory referral to Cardiology       Follow up plan: Return for Physical Exam spring.

## 2015-07-14 NOTE — Assessment & Plan Note (Signed)
Blood pressure modestly elevated today Patient goes up and down on her amlodipine 2.5 according to her blood pressure response Pt always has higher blood pressure going into [facility] for chemotherapy and takes an extra to control her blood pressure. On review hypertension as a side effect of avastin

## 2015-07-14 NOTE — Assessment & Plan Note (Signed)
On active therapy. Patient stable for now but with a lot of potential for side effects.

## 2015-07-14 NOTE — Assessment & Plan Note (Signed)
For chest pain will make referral to cardiology to further evaluate Reviewed old records of significance no prior cardiac history Reviewed labs which are normal patient wanted to wait until her next regular blood draw for any further labs Discuss calling 911 use of aspirin and concern about this being a severe side effect from her medication with life-threatening potential.

## 2015-07-20 ENCOUNTER — Other Ambulatory Visit: Payer: BC Managed Care – PPO

## 2015-07-27 ENCOUNTER — Other Ambulatory Visit: Payer: BC Managed Care – PPO

## 2015-07-28 ENCOUNTER — Other Ambulatory Visit: Payer: BC Managed Care – PPO

## 2015-07-28 DIAGNOSIS — C569 Malignant neoplasm of unspecified ovary: Secondary | ICD-10-CM

## 2015-07-29 LAB — URINALYSIS, ROUTINE W REFLEX MICROSCOPIC

## 2015-07-30 ENCOUNTER — Telehealth: Payer: Self-pay | Admitting: Family Medicine

## 2015-07-30 MED ORDER — NITROFURANTOIN MONOHYD MACRO 100 MG PO CAPS: 100.0000 mg | ORAL_CAPSULE | Freq: Two times a day (BID) | ORAL | Status: DC

## 2015-07-30 NOTE — Telephone Encounter (Signed)
Called patient with +UTI- sending through [place]. Call if not getting better or getting worse.

## 2015-07-30 NOTE — Telephone Encounter (Signed)
Forward to provider

## 2015-07-30 NOTE — Telephone Encounter (Signed)
Pt would like a call back regarding lab results. She had a uti and would like to have an antibiotic.

## 2015-07-31 ENCOUNTER — Telehealth: Payer: Self-pay

## 2015-07-31 LAB — UA/M W/RFLX CULTURE, ROUTINE
BILIRUBIN UA: NEGATIVE
GLUCOSE, UA: NEGATIVE
KETONES UA: NEGATIVE
Nitrite, UA: NEGATIVE
PROTEIN UA: NEGATIVE
RBC, UA: NEGATIVE
SPEC GRAV UA: 1.009 (ref 1.005–1.030)
Urobilinogen, Ur: 0.2 mg/dL (ref 0.2–1.0)
pH, UA: 6.5 (ref 5.0–7.5)

## 2015-07-31 LAB — CBC WITH DIFFERENTIAL/PLATELET
BASOS: 0 %
Basophils Absolute: 0 10*3/uL (ref 0.0–0.2)
EOS (ABSOLUTE): 0.4 10*3/uL (ref 0.0–0.4)
Eos: 5 %
HEMOGLOBIN: 13.9 g/dL (ref 11.1–15.9)
Hematocrit: 41.2 % (ref 34.0–46.6)
IMMATURE GRANS (ABS): 0 10*3/uL (ref 0.0–0.1)
Immature Granulocytes: 0 %
LYMPHS ABS: 3.3 10*3/uL — AB (ref 0.7–3.1)
LYMPHS: 37 %
MCH: 29.3 pg (ref 26.6–33.0)
MCHC: 33.7 g/dL (ref 31.5–35.7)
MCV: 87 fL (ref 79–97)
MONOCYTES: 9 %
Monocytes Absolute: 0.8 10*3/uL (ref 0.1–0.9)
NEUTROS ABS: 4.5 10*3/uL (ref 1.4–7.0)
Neutrophils: 49 %
Platelets: 296 10*3/uL (ref 150–379)
RBC: 4.75 x10E6/uL (ref 3.77–5.28)
RDW: 14.3 % (ref 12.3–15.4)
WBC: 9 10*3/uL (ref 3.4–10.8)

## 2015-07-31 LAB — COMPREHENSIVE METABOLIC PANEL
A/G RATIO: 1.2 (ref 1.1–2.5)
ALT: 23 IU/L (ref 0–32)
AST: 23 IU/L (ref 0–40)
Albumin: 4.1 g/dL (ref 3.5–5.5)
Alkaline Phosphatase: 103 IU/L (ref 39–117)
BUN/Creatinine Ratio: 15 (ref 9–23)
BUN: 9 mg/dL (ref 6–24)
Bilirubin Total: 0.4 mg/dL (ref 0.0–1.2)
CALCIUM: 9.4 mg/dL (ref 8.7–10.2)
CO2: 22 mmol/L (ref 18–29)
CREATININE: 0.59 mg/dL (ref 0.57–1.00)
Chloride: 96 mmol/L (ref 96–106)
GFR, EST AFRICAN AMERICAN: 120 mL/min/{1.73_m2} (ref >59)
GFR, EST NON AFRICAN AMERICAN: 104 mL/min/{1.73_m2} (ref >59)
GLOBULIN, TOTAL: 3.4 g/dL (ref 1.5–4.5)
Glucose: 87 mg/dL (ref 65–99)
Potassium: 4.4 mmol/L (ref 3.5–5.2)
SODIUM: 136 mmol/L (ref 134–144)
TOTAL PROTEIN: 7.5 g/dL (ref 6.0–8.5)

## 2015-07-31 LAB — MICROSCOPIC EXAMINATION: Casts: NONE SEEN /lpf

## 2015-07-31 LAB — MAGNESIUM: MAGNESIUM: 1.8 mg/dL (ref 1.6–2.3)

## 2015-07-31 LAB — URINE CULTURE, REFLEX

## 2015-07-31 NOTE — Telephone Encounter (Signed)
2nd message left for patient to call regarding cardiology referral.Diane Acosta

## 2015-07-31 NOTE — Telephone Encounter (Signed)
Patient declining referral. See ref note.[facility]

## 2015-07-31 NOTE — Telephone Encounter (Signed)
Left message to call back regarding Cardio appt.[facility]

## 2015-08-17 ENCOUNTER — Other Ambulatory Visit: Payer: BC Managed Care – PPO

## 2015-08-17 DIAGNOSIS — C569 Malignant neoplasm of unspecified ovary: Secondary | ICD-10-CM

## 2015-08-17 LAB — UA/M W/RFLX CULTURE, ROUTINE
Bilirubin, UA: NEGATIVE
GLUCOSE, UA: NEGATIVE
KETONES UA: NEGATIVE
Leukocytes, UA: NEGATIVE
Nitrite, UA: NEGATIVE
Protein, UA: NEGATIVE
RBC, UA: NEGATIVE
Specific Gravity, UA: 1.01 (ref 1.005–1.030)
UUROB: 0.2 mg/dL (ref 0.2–1.0)
pH, UA: 7 (ref 5.0–7.5)

## 2015-08-18 ENCOUNTER — Encounter: Payer: Self-pay | Admitting: Family Medicine

## 2015-08-18 LAB — CBC WITH DIFFERENTIAL/PLATELET
BASOS: 0 %
Basophils Absolute: 0 10*3/uL (ref 0.0–0.2)
EOS (ABSOLUTE): 0.4 10*3/uL (ref 0.0–0.4)
EOS: 5 %
HEMATOCRIT: 39.7 % (ref 34.0–46.6)
Hemoglobin: 13.4 g/dL (ref 11.1–15.9)
IMMATURE GRANULOCYTES: 0 %
Immature Grans (Abs): 0 10*3/uL (ref 0.0–0.1)
Lymphocytes Absolute: 3.1 10*3/uL (ref 0.7–3.1)
Lymphs: 37 %
MCH: 28.9 pg (ref 26.6–33.0)
MCHC: 33.8 g/dL (ref 31.5–35.7)
MCV: 86 fL (ref 79–97)
MONOS ABS: 0.8 10*3/uL (ref 0.1–0.9)
Monocytes: 9 %
NEUTROS ABS: 4.1 10*3/uL (ref 1.4–7.0)
NEUTROS PCT: 49 %
Platelets: 292 10*3/uL (ref 150–379)
RBC: 4.63 x10E6/uL (ref 3.77–5.28)
RDW: 14.5 % (ref 12.3–15.4)
WBC: 8.4 10*3/uL (ref 3.4–10.8)

## 2015-08-18 LAB — MAGNESIUM: Magnesium: 1.6 mg/dL (ref 1.6–2.3)

## 2015-08-18 LAB — URINALYSIS, COMPLETE
Bilirubin, UA: NEGATIVE
GLUCOSE, UA: NEGATIVE
Ketones, UA: NEGATIVE
LEUKOCYTES UA: NEGATIVE
Nitrite, UA: NEGATIVE
PROTEIN UA: NEGATIVE
RBC, UA: NEGATIVE
SPEC GRAV UA: 1.005 (ref 1.005–1.030)
Urobilinogen, Ur: 0.2 mg/dL (ref 0.2–1.0)
pH, UA: 7 (ref 5.0–7.5)

## 2015-08-18 LAB — COMPREHENSIVE METABOLIC PANEL
A/G RATIO: 1.2 (ref 1.1–2.5)
ALBUMIN: 4 g/dL (ref 3.5–5.5)
ALT: 20 IU/L (ref 0–32)
AST: 23 IU/L (ref 0–40)
Alkaline Phosphatase: 106 IU/L (ref 39–117)
BUN / CREAT RATIO: 16 (ref 9–23)
BUN: 9 mg/dL (ref 6–24)
Bilirubin Total: 0.5 mg/dL (ref 0.0–1.2)
CALCIUM: 9.3 mg/dL (ref 8.7–10.2)
CO2: 23 mmol/L (ref 18–29)
CREATININE: 0.58 mg/dL (ref 0.57–1.00)
Chloride: 98 mmol/L (ref 96–106)
GFR, EST AFRICAN AMERICAN: 121 mL/min/{1.73_m2} (ref >59)
GFR, EST NON AFRICAN AMERICAN: 105 mL/min/{1.73_m2} (ref >59)
GLOBULIN, TOTAL: 3.3 g/dL (ref 1.5–4.5)
Glucose: 90 mg/dL (ref 65–99)
POTASSIUM: 4.3 mmol/L (ref 3.5–5.2)
SODIUM: 139 mmol/L (ref 134–144)
TOTAL PROTEIN: 7.3 g/dL (ref 6.0–8.5)

## 2015-08-18 LAB — MICROSCOPIC EXAMINATION
BACTERIA UA: NONE SEEN
Casts: NONE SEEN /lpf
RBC MICROSCOPIC, UA: NONE SEEN /HPF (ref 0–?)

## 2015-09-07 ENCOUNTER — Other Ambulatory Visit: Payer: BC Managed Care – PPO

## 2015-09-07 DIAGNOSIS — C569 Malignant neoplasm of unspecified ovary: Secondary | ICD-10-CM

## 2015-09-07 LAB — MICROSCOPIC EXAMINATION

## 2015-09-07 LAB — URINALYSIS, ROUTINE W REFLEX MICROSCOPIC
BILIRUBIN UA: NEGATIVE
Glucose, UA: NEGATIVE
KETONES UA: NEGATIVE
Nitrite, UA: NEGATIVE
PROTEIN UA: NEGATIVE
SPEC GRAV UA: 1.015 (ref 1.005–1.030)
Urobilinogen, Ur: 0.2 mg/dL (ref 0.2–1.0)
pH, UA: 5 (ref 5.0–7.5)

## 2015-09-08 ENCOUNTER — Encounter: Payer: Self-pay | Admitting: Family Medicine

## 2015-09-08 LAB — CBC WITH DIFFERENTIAL/PLATELET
BASOS ABS: 0 10*3/uL (ref 0.0–0.2)
Basos: 0 %
EOS (ABSOLUTE): 0.4 10*3/uL (ref 0.0–0.4)
Eos: 4 %
Hematocrit: 43.2 % (ref 34.0–46.6)
Hemoglobin: 14.4 g/dL (ref 11.1–15.9)
Immature Grans (Abs): 0 10*3/uL (ref 0.0–0.1)
Immature Granulocytes: 0 %
LYMPHS ABS: 3.5 10*3/uL — AB (ref 0.7–3.1)
Lymphs: 37 %
MCH: 28.9 pg (ref 26.6–33.0)
MCHC: 33.3 g/dL (ref 31.5–35.7)
MCV: 87 fL (ref 79–97)
MONOS ABS: 0.7 10*3/uL (ref 0.1–0.9)
Monocytes: 8 %
Neutrophils Absolute: 4.9 10*3/uL (ref 1.4–7.0)
Neutrophils: 51 %
Platelets: 260 10*3/uL (ref 150–379)
RBC: 4.98 x10E6/uL (ref 3.77–5.28)
RDW: 14.3 % (ref 12.3–15.4)
WBC: 9.5 10*3/uL (ref 3.4–10.8)

## 2015-09-08 LAB — COMPREHENSIVE METABOLIC PANEL
A/G RATIO: 1.2 (ref 1.1–2.5)
ALK PHOS: 103 IU/L (ref 39–117)
ALT: 18 IU/L (ref 0–32)
AST: 29 IU/L (ref 0–40)
Albumin: 4.2 g/dL (ref 3.5–5.5)
BILIRUBIN TOTAL: 0.4 mg/dL (ref 0.0–1.2)
BUN/Creatinine Ratio: 15 (ref 9–23)
BUN: 10 mg/dL (ref 6–24)
CHLORIDE: 100 mmol/L (ref 96–106)
CO2: 20 mmol/L (ref 18–29)
Calcium: 9.6 mg/dL (ref 8.7–10.2)
Creatinine, Ser: 0.67 mg/dL (ref 0.57–1.00)
GFR calc Af Amer: 115 mL/min/{1.73_m2} (ref >59)
GFR, EST NON AFRICAN AMERICAN: 100 mL/min/{1.73_m2} (ref >59)
Globulin, Total: 3.4 g/dL (ref 1.5–4.5)
Glucose: 93 mg/dL (ref 65–99)
POTASSIUM: 5 mmol/L (ref 3.5–5.2)
SODIUM: 141 mmol/L (ref 134–144)
Total Protein: 7.6 g/dL (ref 6.0–8.5)

## 2015-09-08 LAB — MAGNESIUM: Magnesium: 1.7 mg/dL (ref 1.6–2.3)

## 2015-09-11 ENCOUNTER — Telehealth: Payer: Self-pay | Admitting: Family Medicine

## 2015-09-11 NOTE — Telephone Encounter (Signed)
Patient called stating that she had labs done and she would like to a doctor to talk about it before the end of today because her lab looked like there is a type of infection and would like to talk to someone regarding this. Please return pt call [phone], thanks.

## 2015-09-22 ENCOUNTER — Ambulatory Visit (INDEPENDENT_AMBULATORY_CARE_PROVIDER_SITE_OTHER): Payer: BC Managed Care – PPO | Admitting: Family Medicine

## 2015-09-22 ENCOUNTER — Encounter: Payer: Self-pay | Admitting: Family Medicine

## 2015-09-22 VITALS — BP 137/81 | HR 83 | Temp 98.0°F | Ht 66.3 in | Wt 171.0 lb

## 2015-09-22 DIAGNOSIS — Z0000 Encounter for general adult medical examination without abnormal findings: Secondary | ICD-10-CM

## 2015-09-22 DIAGNOSIS — Z1239 Encounter for other screening for malignant neoplasm of breast: Secondary | ICD-10-CM

## 2015-09-22 DIAGNOSIS — C569 Malignant neoplasm of unspecified ovary: Secondary | ICD-10-CM

## 2015-09-22 DIAGNOSIS — I10 Essential (primary) hypertension: Secondary | ICD-10-CM

## 2015-09-22 NOTE — Assessment & Plan Note (Signed)
Followed by [facility] and stable

## 2015-09-22 NOTE — Addendum Note (Signed)
Addended by: Rowe Clack H on: 09/22/2015 11:27 AM   Modules accepted: Orders, SmartSet

## 2015-09-22 NOTE — Assessment & Plan Note (Signed)
The current medical regimen is effective;  continue present plan and medications.  

## 2015-09-22 NOTE — Progress Notes (Signed)
BP 137/81 mmHg  Pulse 83  Temp(Src) 98 F (36.7 C)  Ht 5' 6.3" (1.684 m)  Wt 171 lb (77.565 kg)  BMI 27.35 kg/m2  SpO2 96%   Subjective:    Patient ID: Diane Acosta, female    DOB: 04/03/1961, 55 y.o.   MRN: [id number]  HPI: Diane Acosta is a 55 y.o. female  Chief Complaint  Patient presents with  . Annual Exam   patient all in all doing well taking blood pressure medications with blood pressure elevated when getting avastin But otherwise doing well has only has 2 more infusions and will complete her year  Otherwise is been doing well. Concerned has some no real vaginal itch but some cloudiness of urine. But no other specific symptoms and urine from 2 weeks ago was cloudy with no evidence of infection. Patient had changed body wash may be causing an irritation.  Relevant past medical, surgical, family and social history reviewed and updated as indicated. Interim medical history since our last visit reviewed. Allergies and medications reviewed and updated.  Review of Systems  Constitutional: Positive for fatigue.  HENT: Negative.   Eyes: Negative.   Respiratory: Negative.   Cardiovascular: Negative.   Gastrointestinal: Positive for abdominal pain.  Endocrine: Negative.   Genitourinary: Negative for dysuria, frequency and difficulty urinating.  Musculoskeletal: Negative.   Skin: Negative.   Allergic/Immunologic: Negative.   Neurological: Negative.   Hematological: Negative.   Psychiatric/Behavioral: Negative.     Per HPI unless specifically indicated above     Objective:    BP 137/81 mmHg  Pulse 83  Temp(Src) 98 F (36.7 C)  Ht 5' 6.3" (1.684 m)  Wt 171 lb (77.565 kg)  BMI 27.35 kg/m2  SpO2 96%  Wt Readings from Last 3 Encounters:  09/22/15 171 lb (77.565 kg)  07/14/15 170 lb (77.111 kg)  01/07/15 171 lb (77.565 kg)    Physical Exam  Constitutional: She is oriented to person, place, and time. She appears well-developed and well-nourished.  HENT:   Head: Normocephalic and atraumatic.  Right Ear: External ear normal.  Left Ear: External ear normal.  Nose: Nose normal.  Mouth/Throat: Oropharynx is clear and moist.  Eyes: Conjunctivae and EOM are normal. Pupils are equal, round, and reactive to light.  Neck: Normal range of motion. Neck supple. Carotid bruit is not present.  Cardiovascular: Normal rate, regular rhythm and normal heart sounds.   No murmur heard. Pulmonary/Chest: Effort normal and breath sounds normal. She exhibits no mass. Right breast exhibits no mass, no skin change and no tenderness. Left breast exhibits no mass, no skin change and no tenderness. Breasts are symmetrical.  Abdominal: Soft. Bowel sounds are normal. There is no hepatosplenomegaly.  Musculoskeletal: Normal range of motion.  Neurological: She is alert and oriented to person, place, and time.  Skin: No rash noted.  Psychiatric: She has a normal mood and affect. Her behavior is normal. Judgment and thought content normal.    Results for orders placed or performed in visit on 09/07/15  Microscopic Examination  Result Value Ref Range   WBC, UA 0-5 0 -  5 /hpf   RBC, UA 0-2 0 -  2 /hpf   Epithelial Cells (non renal) >10 (A) 0 - 10 /hpf   Mucus, UA Present Not Estab.   Bacteria, UA Few None seen/Few  Comprehensive metabolic panel  Result Value Ref Range   Glucose 93 65 - 99 mg/dL   BUN 10 6 - 24 mg/dL  Creatinine, Ser 0.67 0.57 - 1.00 mg/dL   GFR calc non Af Amer 100 >59 mL/min/1.73   GFR calc Af Amer 115 >59 mL/min/1.73   BUN/Creatinine Ratio 15 9 - 23   Sodium 141 134 - 144 mmol/L   Potassium 5.0 3.5 - 5.2 mmol/L   Chloride 100 96 - 106 mmol/L   CO2 20 18 - 29 mmol/L   Calcium 9.6 8.7 - 10.2 mg/dL   Total Protein 7.6 6.0 - 8.5 g/dL   Albumin 4.2 3.5 - 5.5 g/dL   Globulin, Total 3.4 1.5 - 4.5 g/dL   Albumin/Globulin Ratio 1.2 1.1 - 2.5   Bilirubin Total 0.4 0.0 - 1.2 mg/dL   Alkaline Phosphatase 103 39 - 117 IU/L   AST 29 0 - 40 IU/L   ALT  18 0 - 32 IU/L  Magnesium  Result Value Ref Range   Magnesium 1.7 1.6 - 2.3 mg/dL  Urinalysis, Routine w reflex microscopic (not at [facility])  Result Value Ref Range   Specific Gravity, UA 1.015 1.005 - 1.030   pH, UA 5.0 5.0 - 7.5   Color, UA Yellow Yellow   Appearance Ur Cloudy (A) Clear   Leukocytes, UA 1+ (A) Negative   Protein, UA Negative Negative/Trace   Glucose, UA Negative Negative   Ketones, UA Negative Negative   RBC, UA Trace (A) Negative   Bilirubin, UA Negative Negative   Urobilinogen, Ur 0.2 0.2 - 1.0 mg/dL   Nitrite, UA Negative Negative   Microscopic Examination See below:   CBC with Differential/Platelet  Result Value Ref Range   WBC 9.5 3.4 - 10.8 x10E3/uL   RBC 4.98 3.77 - 5.28 x10E6/uL   Hemoglobin 14.4 11.1 - 15.9 g/dL   Hematocrit 43.2 34.0 - 46.6 %   MCV 87 79 - 97 fL   MCH 28.9 26.6 - 33.0 pg   MCHC 33.3 31.5 - 35.7 g/dL   RDW 14.3 12.3 - 15.4 %   Platelets 260 150 - 379 x10E3/uL   Neutrophils 51 %   Lymphs 37 %   Monocytes 8 %   Eos 4 %   Basos 0 %   Neutrophils Absolute 4.9 1.4 - 7.0 x10E3/uL   Lymphocytes Absolute 3.5 (H) 0.7 - 3.1 x10E3/uL   Monocytes Absolute 0.7 0.1 - 0.9 x10E3/uL   EOS (ABSOLUTE) 0.4 0.0 - 0.4 x10E3/uL   Basophils Absolute 0.0 0.0 - 0.2 x10E3/uL   Immature Granulocytes 0 %   Immature Grans (Abs) 0.0 0.0 - 0.1 x10E3/uL      Assessment & Plan:   Problem List Items Addressed This Visit      Cardiovascular and Mediastinum   Essential (primary) hypertension - Primary    The current medical regimen is effective;  continue present plan and medications.         Genitourinary   Ovarian cancer ([place])    Followed by [facility] and stable       Other Visit Diagnoses    PE (physical exam), annual        Relevant Orders    TSH    Lipid panel        Follow up plan: Return in about 6 months (around 03/24/2016) for BMP and BP check.

## 2015-09-28 ENCOUNTER — Other Ambulatory Visit: Payer: BC Managed Care – PPO

## 2015-09-28 DIAGNOSIS — I10 Essential (primary) hypertension: Secondary | ICD-10-CM

## 2015-09-28 DIAGNOSIS — Z1239 Encounter for other screening for malignant neoplasm of breast: Secondary | ICD-10-CM

## 2015-09-28 DIAGNOSIS — Z0000 Encounter for general adult medical examination without abnormal findings: Secondary | ICD-10-CM

## 2015-09-28 DIAGNOSIS — C569 Malignant neoplasm of unspecified ovary: Secondary | ICD-10-CM

## 2015-09-29 ENCOUNTER — Encounter: Payer: Self-pay | Admitting: Family Medicine

## 2015-09-29 LAB — COMPREHENSIVE METABOLIC PANEL
ALBUMIN: 3.8 g/dL (ref 3.5–5.5)
ALT: 15 IU/L (ref 0–32)
AST: 17 IU/L (ref 0–40)
Albumin/Globulin Ratio: 1.2 (ref 1.2–2.2)
Alkaline Phosphatase: 104 IU/L (ref 39–117)
BILIRUBIN TOTAL: 0.4 mg/dL (ref 0.0–1.2)
BUN / CREAT RATIO: 19 (ref 9–23)
BUN: 12 mg/dL (ref 6–24)
CALCIUM: 9.2 mg/dL (ref 8.7–10.2)
CO2: 24 mmol/L (ref 18–29)
CREATININE: 0.63 mg/dL (ref 0.57–1.00)
Chloride: 99 mmol/L (ref 96–106)
GFR, EST AFRICAN AMERICAN: 118 mL/min/{1.73_m2} (ref >59)
GFR, EST NON AFRICAN AMERICAN: 102 mL/min/{1.73_m2} (ref >59)
GLUCOSE: 89 mg/dL (ref 65–99)
Globulin, Total: 3.1 g/dL (ref 1.5–4.5)
Potassium: 4.4 mmol/L (ref 3.5–5.2)
Sodium: 141 mmol/L (ref 134–144)
TOTAL PROTEIN: 6.9 g/dL (ref 6.0–8.5)

## 2015-09-29 LAB — CBC WITH DIFFERENTIAL/PLATELET
BASOS: 0 %
Basophils Absolute: 0 10*3/uL (ref 0.0–0.2)
EOS (ABSOLUTE): 0.3 10*3/uL (ref 0.0–0.4)
EOS: 3 %
HEMOGLOBIN: 13.7 g/dL (ref 11.1–15.9)
Hematocrit: 41.6 % (ref 34.0–46.6)
IMMATURE GRANS (ABS): 0 10*3/uL (ref 0.0–0.1)
IMMATURE GRANULOCYTES: 0 %
LYMPHS: 34 %
Lymphocytes Absolute: 3.3 10*3/uL — ABNORMAL HIGH (ref 0.7–3.1)
MCH: 28.8 pg (ref 26.6–33.0)
MCHC: 32.9 g/dL (ref 31.5–35.7)
MCV: 88 fL (ref 79–97)
MONOCYTES: 7 %
Monocytes Absolute: 0.7 10*3/uL (ref 0.1–0.9)
NEUTROS ABS: 5.6 10*3/uL (ref 1.4–7.0)
NEUTROS PCT: 56 %
PLATELETS: 274 10*3/uL (ref 150–379)
RBC: 4.75 x10E6/uL (ref 3.77–5.28)
RDW: 14.5 % (ref 12.3–15.4)
WBC: 9.9 10*3/uL (ref 3.4–10.8)

## 2015-09-29 LAB — MICROSCOPIC EXAMINATION
Bacteria, UA: NONE SEEN
CASTS: NONE SEEN /LPF

## 2015-09-29 LAB — LIPID PANEL
Chol/HDL Ratio: 4.6 ratio units — ABNORMAL HIGH (ref 0.0–4.4)
Cholesterol, Total: 194 mg/dL (ref 100–199)
HDL: 42 mg/dL (ref >39)
LDL Calculated: 114 mg/dL — ABNORMAL HIGH (ref 0–99)
TRIGLYCERIDES: 192 mg/dL — AB (ref 0–149)
VLDL Cholesterol Cal: 38 mg/dL (ref 5–40)

## 2015-09-29 LAB — URINALYSIS, COMPLETE
Bilirubin, UA: NEGATIVE
GLUCOSE, UA: NEGATIVE
Ketones, UA: NEGATIVE
Leukocytes, UA: NEGATIVE
NITRITE UA: NEGATIVE
PH UA: 5 (ref 5.0–7.5)
PROTEIN UA: NEGATIVE
RBC UA: NEGATIVE
Specific Gravity, UA: 1.019 (ref 1.005–1.030)
UUROB: 0.2 mg/dL (ref 0.2–1.0)

## 2015-09-29 LAB — MAGNESIUM: Magnesium: 1.7 mg/dL (ref 1.6–2.3)

## 2015-09-29 LAB — TSH: TSH: 2.81 u[IU]/mL (ref 0.450–4.500)

## 2015-10-19 ENCOUNTER — Other Ambulatory Visit: Payer: BC Managed Care – PPO

## 2015-10-19 DIAGNOSIS — C569 Malignant neoplasm of unspecified ovary: Secondary | ICD-10-CM

## 2015-10-20 ENCOUNTER — Encounter: Payer: Self-pay | Admitting: Family Medicine

## 2015-10-20 LAB — URINALYSIS, COMPLETE
Bilirubin, UA: NEGATIVE
Glucose, UA: NEGATIVE
KETONES UA: NEGATIVE
NITRITE UA: NEGATIVE
Protein, UA: NEGATIVE
RBC, UA: NEGATIVE
Specific Gravity, UA: 1.023 (ref 1.005–1.030)
UUROB: 0.2 mg/dL (ref 0.2–1.0)
pH, UA: 5.5 (ref 5.0–7.5)

## 2015-10-20 LAB — CBC WITH DIFFERENTIAL/PLATELET
BASOS: 1 %
Basophils Absolute: 0 10*3/uL (ref 0.0–0.2)
EOS (ABSOLUTE): 0.4 10*3/uL (ref 0.0–0.4)
EOS: 4 %
HEMOGLOBIN: 14 g/dL (ref 11.1–15.9)
Hematocrit: 43.2 % (ref 34.0–46.6)
IMMATURE GRANS (ABS): 0 10*3/uL (ref 0.0–0.1)
IMMATURE GRANULOCYTES: 0 %
LYMPHS: 37 %
Lymphocytes Absolute: 3.3 10*3/uL — ABNORMAL HIGH (ref 0.7–3.1)
MCH: 28.9 pg (ref 26.6–33.0)
MCHC: 32.4 g/dL (ref 31.5–35.7)
MCV: 89 fL (ref 79–97)
MONOCYTES: 6 %
Monocytes Absolute: 0.5 10*3/uL (ref 0.1–0.9)
NEUTROS ABS: 4.7 10*3/uL (ref 1.4–7.0)
Neutrophils: 52 %
Platelets: 275 10*3/uL (ref 150–379)
RBC: 4.85 x10E6/uL (ref 3.77–5.28)
RDW: 14.8 % (ref 12.3–15.4)
WBC: 8.9 10*3/uL (ref 3.4–10.8)

## 2015-10-20 LAB — MICROSCOPIC EXAMINATION
BACTERIA UA: NONE SEEN
Casts: NONE SEEN /lpf

## 2015-10-20 LAB — COMPREHENSIVE METABOLIC PANEL
A/G RATIO: 1.3 (ref 1.2–2.2)
ALT: 18 IU/L (ref 0–32)
AST: 17 IU/L (ref 0–40)
Albumin: 4.1 g/dL (ref 3.5–5.5)
Alkaline Phosphatase: 110 IU/L (ref 39–117)
BUN/Creatinine Ratio: 22 (ref 9–23)
BUN: 14 mg/dL (ref 6–24)
Bilirubin Total: 0.3 mg/dL (ref 0.0–1.2)
CALCIUM: 9.4 mg/dL (ref 8.7–10.2)
CO2: 20 mmol/L (ref 18–29)
CREATININE: 0.65 mg/dL (ref 0.57–1.00)
Chloride: 97 mmol/L (ref 96–106)
GFR, EST AFRICAN AMERICAN: 116 mL/min/{1.73_m2} (ref >59)
GFR, EST NON AFRICAN AMERICAN: 101 mL/min/{1.73_m2} (ref >59)
GLUCOSE: 114 mg/dL — AB (ref 65–99)
Globulin, Total: 3.1 g/dL (ref 1.5–4.5)
Potassium: 4.2 mmol/L (ref 3.5–5.2)
Sodium: 141 mmol/L (ref 134–144)
TOTAL PROTEIN: 7.2 g/dL (ref 6.0–8.5)

## 2015-10-20 LAB — MAGNESIUM: MAGNESIUM: 1.7 mg/dL (ref 1.6–2.3)

## 2016-03-24 ENCOUNTER — Other Ambulatory Visit: Payer: Self-pay | Admitting: Family Medicine

## 2016-03-24 DIAGNOSIS — I10 Essential (primary) hypertension: Secondary | ICD-10-CM

## 2016-03-28 ENCOUNTER — Ambulatory Visit (INDEPENDENT_AMBULATORY_CARE_PROVIDER_SITE_OTHER): Payer: BC Managed Care – PPO | Admitting: Family Medicine

## 2016-03-28 ENCOUNTER — Ambulatory Visit: Payer: BC Managed Care – PPO | Admitting: Family Medicine

## 2016-03-28 ENCOUNTER — Encounter: Payer: Self-pay | Admitting: Family Medicine

## 2016-03-28 VITALS — BP 151/81 | HR 99 | Temp 98.3°F | Wt 175.0 lb

## 2016-03-28 DIAGNOSIS — I10 Essential (primary) hypertension: Secondary | ICD-10-CM | POA: Diagnosis not present

## 2016-03-28 DIAGNOSIS — I6521 Occlusion and stenosis of right carotid artery: Secondary | ICD-10-CM | POA: Diagnosis not present

## 2016-03-28 MED ORDER — AMLODIPINE BESYLATE 2.5 MG PO TABS: 2.5000 mg | ORAL_TABLET | Freq: Two times a day (BID) | ORAL | 4 refills | Status: DC

## 2016-03-28 MED ORDER — ASPIRIN 81 MG PO CHEW: 81.0000 mg | CHEWABLE_TABLET | Freq: Every day | ORAL | 4 refills | Status: DC

## 2016-03-28 MED ORDER — ROSUVASTATIN CALCIUM 5 MG PO TABS: 5.0000 mg | ORAL_TABLET | Freq: Every day | ORAL | 3 refills | Status: DC

## 2016-03-28 NOTE — Assessment & Plan Note (Signed)
Control with problems with medications will increase amlodipine from 2.5 a day to 2.5 twice a day. Discontinue losartan 50 mg Discussed lifestyle measures for blood pressure control.

## 2016-03-28 NOTE — Progress Notes (Signed)
BP (!) 151/81 (BP Location: Left Arm, Patient Position: Sitting, Cuff Size: Small)   Pulse 99   Temp 98.3 F (36.8 C)   Wt 175 lb (79.4 kg) Comment: with shoes  SpO2 98%   BMI 27.99 kg/m    Subjective:    Patient ID: Diane Acosta, female    DOB: 21-Jul-1960, 55 y.o.   MRN: [id number]  HPI: Diane Acosta is a 55 y.o. female  Chief Complaint  Patient presents with  . Hypertension  Recheck hypertension patient doing well taking half dose of medications because of concern about possible side effects feels that losartan is causing abdominal gas complaints wants to stop this medication.  Was in the emergency room for vertigo and discovered 50% blockage of right carotid artery no prior trauma irritation to the side of her neck radiation etc.  Patient's cholesterol is been formally okay but now developing atherosclerosis  Relevant past medical, surgical, family and social history reviewed and updated as indicated. Interim medical history since our last visit reviewed. Allergies and medications reviewed and updated.  Review of Systems  Constitutional: Negative.   Respiratory: Negative.   Cardiovascular: Negative.     Per HPI unless specifically indicated above     Objective:    BP (!) 151/81 (BP Location: Left Arm, Patient Position: Sitting, Cuff Size: Small)   Pulse 99   Temp 98.3 F (36.8 C)   Wt 175 lb (79.4 kg) Comment: with shoes  SpO2 98%   BMI 27.99 kg/m   Wt Readings from Last 3 Encounters:  03/28/16 175 lb (79.4 kg)  09/22/15 171 lb (77.6 kg)  07/14/15 170 lb (77.1 kg)    Physical Exam  Constitutional: She is oriented to person, place, and time. She appears well-developed and well-nourished. No distress.  HENT:  Head: Normocephalic and atraumatic.  Right Ear: Hearing normal.  Left Ear: Hearing normal.  Nose: Nose normal.  Eyes: Conjunctivae and lids are normal. Right eye exhibits no discharge. Left eye exhibits no discharge. No scleral icterus.    Cardiovascular: Normal rate, regular rhythm and normal heart sounds.   Pulmonary/Chest: Effort normal and breath sounds normal. No respiratory distress.  Musculoskeletal: Normal range of motion.  Neurological: She is alert and oriented to person, place, and time.  Skin: Skin is intact. No rash noted.  Psychiatric: She has a normal mood and affect. Her speech is normal and behavior is normal. Judgment and thought content normal. Cognition and memory are normal.    Results for orders placed or performed in visit on 10/19/15  Microscopic Examination  Result Value Ref Range   WBC, UA 0-5 0 - 5 /hpf   RBC, UA 0-2 0 - 2 /hpf   Epithelial Cells (non renal) >10 (A) 0 - 10 /hpf   Casts None seen None seen /lpf   Mucus, UA Present Not Estab.   Bacteria, UA None seen None seen/Few  Magnesium  Result Value Ref Range   Magnesium 1.7 1.6 - 2.3 mg/dL  Comp Met (CMET)  Result Value Ref Range   Glucose 114 (H) 65 - 99 mg/dL   BUN 14 6 - 24 mg/dL   Creatinine, Ser 0.65 0.57 - 1.00 mg/dL   GFR calc non Af Amer 101 >59 mL/min/1.73   GFR calc Af Amer 116 >59 mL/min/1.73   BUN/Creatinine Ratio 22 9 - 23   Sodium 141 134 - 144 mmol/L   Potassium 4.2 3.5 - 5.2 mmol/L   Chloride 97 96 - 106  mmol/L   CO2 20 18 - 29 mmol/L   Calcium 9.4 8.7 - 10.2 mg/dL   Total Protein 7.2 6.0 - 8.5 g/dL   Albumin 4.1 3.5 - 5.5 g/dL   Globulin, Total 3.1 1.5 - 4.5 g/dL   Albumin/Globulin Ratio 1.3 1.2 - 2.2   Bilirubin Total 0.3 0.0 - 1.2 mg/dL   Alkaline Phosphatase 110 39 - 117 IU/L   AST 17 0 - 40 IU/L   ALT 18 0 - 32 IU/L  CBC with Differential  Result Value Ref Range   WBC 8.9 3.4 - 10.8 x10E3/uL   RBC 4.85 3.77 - 5.28 x10E6/uL   Hemoglobin 14.0 11.1 - 15.9 g/dL   Hematocrit 43.2 34.0 - 46.6 %   MCV 89 79 - 97 fL   MCH 28.9 26.6 - 33.0 pg   MCHC 32.4 31.5 - 35.7 g/dL   RDW 14.8 12.3 - 15.4 %   Platelets 275 150 - 379 x10E3/uL   Neutrophils 52 %   Lymphs 37 %   Monocytes 6 %   Eos 4 %   Basos 1 %    Neutrophils Absolute 4.7 1.4 - 7.0 x10E3/uL   Lymphocytes Absolute 3.3 (H) 0.7 - 3.1 x10E3/uL   Monocytes Absolute 0.5 0.1 - 0.9 x10E3/uL   EOS (ABSOLUTE) 0.4 0.0 - 0.4 x10E3/uL   Basophils Absolute 0.0 0.0 - 0.2 x10E3/uL   Immature Granulocytes 0 %   Immature Grans (Abs) 0.0 0.0 - 0.1 x10E3/uL  Urinalysis, Complete  Result Value Ref Range   Specific Gravity, UA 1.023 1.005 - 1.030   pH, UA 5.5 5.0 - 7.5   Color, UA Yellow Yellow   Appearance Ur Clear Clear   Leukocytes, UA 1+ (A) Negative   Protein, UA Negative Negative/Trace   Glucose, UA Negative Negative   Ketones, UA Negative Negative   RBC, UA Negative Negative   Bilirubin, UA Negative Negative   Urobilinogen, Ur 0.2 0.2 - 1.0 mg/dL   Nitrite, UA Negative Negative   Microscopic Examination See below:       Assessment & Plan:   Problem List Items Addressed This Visit      Cardiovascular and Mediastinum   Essential (primary) hypertension - Primary    Control with problems with medications will increase amlodipine from 2.5 a day to 2.5 twice a day. Discontinue losartan 50 mg Discussed lifestyle measures for blood pressure control.      Relevant Medications   aspirin 81 MG chewable tablet   rosuvastatin (CRESTOR) 5 MG tablet   amLODipine (NORVASC) 2.5 MG tablet   aspirin 81 MG chewable tablet   Other Relevant Orders   Basic metabolic panel   Atherosclerosis of right carotid artery    Discuss need for disease modification by modifying cholesterol. Will start Crestor 5 mg a day patient wants to use lowest possible dose. Patient will also start aspirin 81 mg a day We will recheck 1 month for blood pressure cholesterol Will need carotid artery follow-up in 6 months      Relevant Medications   aspirin 81 MG chewable tablet   rosuvastatin (CRESTOR) 5 MG tablet   amLODipine (NORVASC) 2.5 MG tablet   aspirin 81 MG chewable tablet    Other Visit Diagnoses   None.      Follow up plan: Return in about 4 weeks  (around 04/25/2016) for BMP,  Lipids, ALT, AST.

## 2016-03-28 NOTE — Assessment & Plan Note (Signed)
Discuss need for disease modification by modifying cholesterol. Will start Crestor 5 mg a day patient wants to use lowest possible dose. Patient will also start aspirin 81 mg a day We will recheck 1 month for blood pressure cholesterol Will need carotid artery follow-up in 6 months

## 2016-03-29 ENCOUNTER — Encounter: Payer: Self-pay | Admitting: Family Medicine

## 2016-03-29 LAB — BASIC METABOLIC PANEL
BUN/Creatinine Ratio: 19 (ref 9–23)
BUN: 10 mg/dL (ref 6–24)
CO2: 23 mmol/L (ref 18–29)
Calcium: 9.3 mg/dL (ref 8.7–10.2)
Chloride: 102 mmol/L (ref 96–106)
Creatinine, Ser: 0.53 mg/dL — ABNORMAL LOW (ref 0.57–1.00)
GFR, EST AFRICAN AMERICAN: 124 mL/min/{1.73_m2} (ref >59)
GFR, EST NON AFRICAN AMERICAN: 108 mL/min/{1.73_m2} (ref >59)
Glucose: 154 mg/dL — ABNORMAL HIGH (ref 65–99)
POTASSIUM: 3.9 mmol/L (ref 3.5–5.2)
SODIUM: 142 mmol/L (ref 134–144)

## 2016-04-28 ENCOUNTER — Ambulatory Visit (INDEPENDENT_AMBULATORY_CARE_PROVIDER_SITE_OTHER): Payer: BC Managed Care – PPO | Admitting: Family Medicine

## 2016-04-28 VITALS — BP 140/80 | HR 91 | Temp 98.2°F | Wt 174.0 lb

## 2016-04-28 DIAGNOSIS — I6521 Occlusion and stenosis of right carotid artery: Secondary | ICD-10-CM | POA: Diagnosis not present

## 2016-04-28 DIAGNOSIS — I10 Essential (primary) hypertension: Secondary | ICD-10-CM

## 2016-04-28 DIAGNOSIS — R7309 Other abnormal glucose: Secondary | ICD-10-CM

## 2016-04-28 DIAGNOSIS — C569 Malignant neoplasm of unspecified ovary: Secondary | ICD-10-CM

## 2016-04-28 DIAGNOSIS — E7800 Pure hypercholesterolemia, unspecified: Secondary | ICD-10-CM

## 2016-04-28 LAB — LP+ALT+AST PICCOLO, WAIVED
ALT (SGPT) PICCOLO, WAIVED: 21 U/L (ref 10–47)
AST (SGOT) Piccolo, Waived: 38 U/L (ref 11–38)
Chol/HDL Ratio Piccolo,Waive: 3.5 mg/dL
Cholesterol Piccolo, Waived: 179 mg/dL (ref <200)
HDL CHOL PICCOLO, WAIVED: 51 mg/dL — AB (ref >59)
LDL Chol Calc Piccolo Waived: 76 mg/dL (ref <100)
Triglycerides Piccolo,Waived: 257 mg/dL — ABNORMAL HIGH (ref <150)
VLDL CHOL CALC PICCOLO,WAIVE: 51 mg/dL — AB (ref <30)

## 2016-04-28 NOTE — Assessment & Plan Note (Signed)
Blood pressure well controlled with no side effects will continue current medications

## 2016-04-28 NOTE — Assessment & Plan Note (Signed)
Followed by [facility] 

## 2016-04-28 NOTE — Progress Notes (Signed)
BP 140/80   Pulse 91   Temp 98.2 F (36.8 C)   Wt 174 lb (78.9 kg)   SpO2 99%   BMI 27.83 kg/m    Subjective:    Patient ID: Diane Acosta, female    DOB: 1961/06/10, 55 y.o.   MRN: [id number]  HPI: Diane Acosta is a 55 y.o. female  Chief Complaint  Patient presents with  . Follow-up    HTN/CHOL PHQ-9 SCORE:0  . Migraine   Patient follow-up hypertension home blood pressure monitoring showing good control with good blood pressure readings has had occasional excursions especially early morning X no edema no issues with medications. Patient with an abundance of caution and concerned hasn't taken Crestor yet has been taking co-q 10. Also has been working on her diet. Reviewed x-rays done at [facility] showing 50% atherosclerotic narrowing of right internal carotid artery and thoracic CT showing calcification of aorta. Relevant past medical, surgical, family and social history reviewed and updated as indicated. Interim medical history since our last visit reviewed. Allergies and medications reviewed and updated.  Review of Systems  Constitutional: Negative.   Respiratory: Negative.   Cardiovascular: Negative.     Per HPI unless specifically indicated above     Objective:    BP 140/80   Pulse 91   Temp 98.2 F (36.8 C)   Wt 174 lb (78.9 kg)   SpO2 99%   BMI 27.83 kg/m   Wt Readings from Last 3 Encounters:  04/28/16 174 lb (78.9 kg)  03/28/16 175 lb (79.4 kg)  09/22/15 171 lb (77.6 kg)    Physical Exam  Constitutional: She is oriented to person, place, and time. She appears well-developed and well-nourished. No distress.  HENT:  Head: Normocephalic and atraumatic.  Right Ear: Hearing normal.  Left Ear: Hearing normal.  Nose: Nose normal.  Eyes: Conjunctivae and lids are normal. Right eye exhibits no discharge. Left eye exhibits no discharge. No scleral icterus.  Cardiovascular: Normal rate, regular rhythm and normal heart sounds.   Pulmonary/Chest: Effort normal and  breath sounds normal. No respiratory distress.  Musculoskeletal: Normal range of motion.  Neurological: She is alert and oriented to person, place, and time.  Skin: Skin is intact. No rash noted.  Psychiatric: She has a normal mood and affect. Her speech is normal and behavior is normal. Judgment and thought content normal. Cognition and memory are normal.    Results for orders placed or performed in visit on [id number]  Basic metabolic panel  Result Value Ref Range   Glucose 154 (H) 65 - 99 mg/dL   BUN 10 6 - 24 mg/dL   Creatinine, Ser 0.53 (L) 0.57 - 1.00 mg/dL   GFR calc non Af Amer 108 >59 mL/min/1.73   GFR calc Af Amer 124 >59 mL/min/1.73   BUN/Creatinine Ratio 19 9 - 23   Sodium 142 134 - 144 mmol/L   Potassium 3.9 3.5 - 5.2 mmol/L   Chloride 102 96 - 106 mmol/L   CO2 23 18 - 29 mmol/L   Calcium 9.3 8.7 - 10.2 mg/dL      Assessment & Plan:   Problem List Items Addressed This Visit      Cardiovascular and Mediastinum   Essential (primary) hypertension    Blood pressure well controlled with no side effects will continue current medications      Relevant Orders   Basic metabolic panel   Atherosclerosis of right carotid artery - Primary   Relevant Orders  LP+ALT+AST Piccolo, Waived (STAT)     Genitourinary   Ovarian cancer ([place])    Followed by [facility]        Other   Hypercholesterolemia    Hypercholesterol with atherosclerotic changes with nice drop in LDL reduction to 76 with dietary measures and using coq 10. Patient will continue same measures.       Other Visit Diagnoses   None.    Glucose elevated will check A1c next visit discuss potential of prediabetes and diabetes with patient.  Follow up plan: Return in about 6 months (around 10/27/2016) for BMP,  Lipids, ALT, AST, Hemoglobin A1c.

## 2016-04-28 NOTE — Assessment & Plan Note (Signed)
Hypercholesterol with atherosclerotic changes with nice drop in LDL reduction to 76 with dietary measures and using coq 10. Patient will continue same measures.

## 2016-04-29 LAB — BASIC METABOLIC PANEL
BUN / CREAT RATIO: 19 (ref 9–23)
BUN: 10 mg/dL (ref 6–24)
CHLORIDE: 101 mmol/L (ref 96–106)
CO2: 25 mmol/L (ref 18–29)
Calcium: 9.3 mg/dL (ref 8.7–10.2)
Creatinine, Ser: 0.54 mg/dL — ABNORMAL LOW (ref 0.57–1.00)
GFR calc non Af Amer: 107 mL/min/{1.73_m2} (ref >59)
GFR, EST AFRICAN AMERICAN: 124 mL/min/{1.73_m2} (ref >59)
Glucose: 139 mg/dL — ABNORMAL HIGH (ref 65–99)
Potassium: 3.8 mmol/L (ref 3.5–5.2)
Sodium: 142 mmol/L (ref 134–144)

## 2016-05-02 ENCOUNTER — Telehealth: Payer: Self-pay | Admitting: Family Medicine

## 2016-05-02 NOTE — Telephone Encounter (Signed)
Phone call Discussed with patient elevated glucose patient had a glass very sweet tea prior to blood draw will observe patient going to [facility] next week will have glucose checked there.

## 2016-05-02 NOTE — Addendum Note (Signed)
Addended byGolden Pop on: 05/02/2016 12:47 PM   Modules accepted: Orders

## 2016-05-05 DIAGNOSIS — I6529 Occlusion and stenosis of unspecified carotid artery: Secondary | ICD-10-CM | POA: Insufficient documentation

## 2016-06-06 ENCOUNTER — Ambulatory Visit (INDEPENDENT_AMBULATORY_CARE_PROVIDER_SITE_OTHER): Payer: BC Managed Care – PPO

## 2016-06-06 DIAGNOSIS — Z23 Encounter for immunization: Secondary | ICD-10-CM

## 2016-06-13 ENCOUNTER — Ambulatory Visit (INDEPENDENT_AMBULATORY_CARE_PROVIDER_SITE_OTHER): Payer: BC Managed Care – PPO | Admitting: Family Medicine

## 2016-06-13 ENCOUNTER — Encounter: Payer: Self-pay | Admitting: Family Medicine

## 2016-06-13 VITALS — BP 173/92 | HR 111 | Temp 98.6°F | Wt 172.8 lb

## 2016-06-13 DIAGNOSIS — I10 Essential (primary) hypertension: Secondary | ICD-10-CM

## 2016-06-13 DIAGNOSIS — L049 Acute lymphadenitis, unspecified: Secondary | ICD-10-CM | POA: Diagnosis not present

## 2016-06-13 MED ORDER — AMOXICILLIN-POT CLAVULANATE 875-125 MG PO TABS: 1.0000 | ORAL_TABLET | Freq: Two times a day (BID) | ORAL | 0 refills | Status: DC

## 2016-06-13 NOTE — Assessment & Plan Note (Signed)
Watch blood pressures patient gets well if continues will need to further evaluate.

## 2016-06-13 NOTE — Progress Notes (Signed)
BP (!) 173/92 (BP Location: Left Arm, Cuff Size: Normal)   Pulse (!) 111   Temp 98.6 F (37 C)   Wt 172 lb 12.8 oz (78.4 kg)   SpO2 97%   BMI 27.64 kg/m    Subjective:    Patient ID: Diane Acosta, female    DOB: 25-Feb-1961, 55 y.o.   MRN: [id number]  HPI: Diane Acosta is a 55 y.o. female  Chief Complaint  Patient presents with  . Edema    pt states she has had swelling in her neck for the last week    Patient right sinus area throat area and neck area swollen tender no known trauma irritation no fever has felt sickly with a cold. Spent ongoing for 5 days has taken 4 days of Z-Pak with no real relief Has history of metastatic ovarian cancer which is stable for now. Patient followed at [facility] Blood pressure elevated but patient 6 right now will observe No change in size of swelling with eating Relevant past medical, surgical, family and social history reviewed and updated as indicated. Interim medical history since our last visit reviewed. Allergies and medications reviewed and updated.  Review of Systems  Constitutional: Positive for chills, diaphoresis and fatigue. Negative for fever.  HENT: Positive for congestion, rhinorrhea, sinus pressure and tinnitus. Negative for dental problem, ear discharge, ear pain, hearing loss, sneezing, sore throat, trouble swallowing and voice change.   Eyes: Negative.   Respiratory: Negative.   Cardiovascular: Negative.     Per HPI unless specifically indicated above     Objective:    BP (!) 173/92 (BP Location: Left Arm, Cuff Size: Normal)   Pulse (!) 111   Temp 98.6 F (37 C)   Wt 172 lb 12.8 oz (78.4 kg)   SpO2 97%   BMI 27.64 kg/m   Wt Readings from Last 3 Encounters:  06/13/16 172 lb 12.8 oz (78.4 kg)  04/28/16 174 lb (78.9 kg)  03/28/16 175 lb (79.4 kg)    Physical Exam  Constitutional: She is oriented to person, place, and time. She appears well-developed and well-nourished. No distress.  HENT:  Head: Normocephalic and  atraumatic.  Right Ear: Hearing and external ear normal.  Left Ear: Hearing and external ear normal.  Nose: Nose normal.  Mouth/Throat: Oropharynx is clear and moist. No oropharyngeal exudate.  Eyes: Conjunctivae and lids are normal. Right eye exhibits no discharge. Left eye exhibits no discharge. No scleral icterus.  Neck: Normal range of motion. Neck supple. No JVD present. No tracheal deviation present. No thyromegaly present.  Right cheek and neck area enlarged no distinct enlargement or mass.  Pulmonary/Chest: Effort normal. No respiratory distress.  Musculoskeletal: Normal range of motion.  Lymphadenopathy:    She has cervical adenopathy.  Neurological: She is alert and oriented to person, place, and time.  Skin: Skin is intact. No rash noted.  Psychiatric: She has a normal mood and affect. Her speech is normal and behavior is normal. Judgment and thought content normal. Cognition and memory are normal.    Results for orders placed or performed in visit on 04/28/16  LP+ALT+AST Piccolo, Waived (STAT)  Result Value Ref Range   ALT (SGPT) Piccolo, Waived 21 10 - 47 U/L   AST (SGOT) Piccolo, Waived 38 11 - 38 U/L   Cholesterol Piccolo, Waived 179 <200 mg/dL   HDL Chol Piccolo, Waived 51 (L) >59 mg/dL   Triglycerides Piccolo,Waived 257 (H) <150 mg/dL   Chol/HDL Ratio Piccolo,Waive 3.5 mg/dL  LDL Chol Calc Piccolo Waived 76 <100 mg/dL   VLDL Chol Calc Piccolo,Waive 51 (H) <30 mg/dL  Basic metabolic panel  Result Value Ref Range   Glucose 139 (H) 65 - 99 mg/dL   BUN 10 6 - 24 mg/dL   Creatinine, Ser 0.54 (L) 0.57 - 1.00 mg/dL   GFR calc non Af Amer 107 >59 mL/min/1.73   GFR calc Af Amer 124 >59 mL/min/1.73   BUN/Creatinine Ratio 19 9 - 23   Sodium 142 134 - 144 mmol/L   Potassium 3.8 3.5 - 5.2 mmol/L   Chloride 101 96 - 106 mmol/L   CO2 25 18 - 29 mmol/L   Calcium 9.3 8.7 - 10.2 mg/dL      Assessment & Plan:   Problem List Items Addressed This Visit      Cardiovascular  and Mediastinum   Essential (primary) hypertension    Watch blood pressures patient gets well if continues will need to further evaluate.      Relevant Medications   amLODipine (NORVASC) 5 MG tablet    Other Visit Diagnoses    Lymphadenitis, acute    -  Primary   Scuffs care and treatment use of antibiotics if not better will need to reevaluate and consider consult imaging.       Follow up plan: Return for As scheduled.

## 2016-06-15 ENCOUNTER — Ambulatory Visit: Payer: BC Managed Care – PPO | Admitting: Family Medicine

## 2016-06-19 ENCOUNTER — Other Ambulatory Visit: Payer: Self-pay | Admitting: Family Medicine

## 2016-06-19 DIAGNOSIS — I10 Essential (primary) hypertension: Secondary | ICD-10-CM

## 2016-06-20 NOTE — Telephone Encounter (Signed)
Your patient 

## 2016-06-20 NOTE — Telephone Encounter (Signed)
Call pt 

## 2016-08-25 ENCOUNTER — Ambulatory Visit (INDEPENDENT_AMBULATORY_CARE_PROVIDER_SITE_OTHER): Payer: BC Managed Care – PPO | Admitting: Family Medicine

## 2016-08-25 ENCOUNTER — Encounter: Payer: Self-pay | Admitting: Family Medicine

## 2016-08-25 VITALS — BP 142/84 | HR 97 | Temp 98.7°F | Wt 172.0 lb

## 2016-08-25 DIAGNOSIS — J069 Acute upper respiratory infection, unspecified: Secondary | ICD-10-CM

## 2016-08-25 MED ORDER — AMOXICILLIN-POT CLAVULANATE 875-125 MG PO TABS: 1.0000 | ORAL_TABLET | Freq: Two times a day (BID) | ORAL | 0 refills | Status: DC

## 2016-08-25 NOTE — Patient Instructions (Signed)
Follow up as needed

## 2016-08-25 NOTE — Progress Notes (Signed)
BP (!) 142/84   Pulse 97   Temp 98.7 F (37.1 C)   Wt 172 lb (78 kg)   SpO2 99%   BMI 27.51 kg/m    Subjective:    Patient ID: Diane Acosta, female    DOB: Jan 18, 1961, 56 y.o.   MRN: [id number]  HPI: Diane Acosta is a 56 y.o. female  Chief Complaint  Patient presents with  . URI    x 3 days, low grade fever, non productive cough, head congestion, scratchy throat, ears feel full. No body aches. Has been trying Mucinex.   Patient presents with almost a week of low grade fever, cough, congestion, sore throat, ear pressure. Denies body aches, chills, CP, SOB. Taking mucinex, advil, emergen- C with no relief. Husband sick with same symptoms. Declines flu swab. Hx of metastatic ovarian cancer, not currently in active treatment.   Past Medical History:  Diagnosis Date  . Asthma    Social History   Social History  . Marital status: Married    Spouse name: N/A  . Number of children: 2  . Years of education: N/A   Occupational History  . Not on file.   Social History Main Topics  . Smoking status: Never Smoker  . Smokeless tobacco: Never Used  . Alcohol use 0.0 oz/week     Comment: rare  . Drug use: No  . Sexual activity: Not on file   Other Topics Concern  . Not on file   Social History Narrative  . No narrative on file    Relevant past medical, surgical, family and social history reviewed and updated as indicated. Interim medical history since our last visit reviewed. Allergies and medications reviewed and updated.  Review of Systems  Constitutional: Positive for fever.  HENT: Positive for congestion, ear pain and sore throat.   Respiratory: Positive for cough.   Cardiovascular: Negative.   Gastrointestinal: Negative.   Genitourinary: Negative.   Musculoskeletal: Negative.   Neurological: Negative.   Psychiatric/Behavioral: Negative.     Per HPI unless specifically indicated above     Objective:    BP (!) 142/84   Pulse 97   Temp 98.7 F (37.1  C)   Wt 172 lb (78 kg)   SpO2 99%   BMI 27.51 kg/m   Wt Readings from Last 3 Encounters:  08/25/16 172 lb (78 kg)  06/13/16 172 lb 12.8 oz (78.4 kg)  04/28/16 174 lb (78.9 kg)    Physical Exam  Constitutional: She is oriented to person, place, and time. She appears well-developed and well-nourished. No distress.  HENT:  Head: Atraumatic.  Nose: Nose normal.  Left middle ear effusion Oropharynx erythematous  Eyes: Conjunctivae are normal. Pupils are equal, round, and reactive to light.  Neck: Normal range of motion. Neck supple.  Cardiovascular: Normal rate and normal heart sounds.   Pulmonary/Chest: Effort normal and breath sounds normal. No respiratory distress. She has no wheezes. She has no rales.  Musculoskeletal: Normal range of motion.  Lymphadenopathy:    She has cervical adenopathy.  Neurological: She is alert and oriented to person, place, and time.  Skin: Skin is warm and dry.  Psychiatric: She has a normal mood and affect. Her behavior is normal.  Nursing note and vitals reviewed.     Assessment & Plan:   Problem List Items Addressed This Visit    None    Visit Diagnoses    Upper respiratory tract infection, unspecified type    -  Primary   Will send augmentin in in case worsening over weekend. Continue mucinex, start flonase, sinus rinses, delsym for cough. Follow up if worsening or no improvement       Follow up plan: Return if symptoms worsen or fail to improve.

## 2016-09-20 ENCOUNTER — Telehealth: Payer: Self-pay | Admitting: Family Medicine

## 2016-09-20 DIAGNOSIS — R948 Abnormal results of function studies of other organs and systems: Secondary | ICD-10-CM

## 2016-09-20 DIAGNOSIS — Z1211 Encounter for screening for malignant neoplasm of colon: Secondary | ICD-10-CM

## 2016-09-20 NOTE — Telephone Encounter (Signed)
Referral in

## 2016-09-20 NOTE — Telephone Encounter (Signed)
Called and spoke to patient. States she had PET done at [facility] today and they told her she needed colonoscopy done. Advised pt we would need those notes to help get insurance approval. Pt to call [facility] to have them fax over results/reasoning. Please advise.

## 2016-09-20 NOTE — Telephone Encounter (Signed)
Pt would like an order for a colonoscopy.

## 2016-09-24 DIAGNOSIS — R948 Abnormal results of function studies of other organs and systems: Secondary | ICD-10-CM | POA: Insufficient documentation

## 2016-09-26 ENCOUNTER — Encounter: Payer: BC Managed Care – PPO | Admitting: Family Medicine

## 2016-09-30 LAB — HM COLONOSCOPY

## 2016-10-06 DIAGNOSIS — C801 Malignant (primary) neoplasm, unspecified: Secondary | ICD-10-CM | POA: Insufficient documentation

## 2016-10-31 ENCOUNTER — Ambulatory Visit (INDEPENDENT_AMBULATORY_CARE_PROVIDER_SITE_OTHER): Payer: BC Managed Care – PPO | Admitting: Family Medicine

## 2016-10-31 ENCOUNTER — Encounter: Payer: Self-pay | Admitting: Family Medicine

## 2016-10-31 VITALS — BP 158/88 | HR 95 | Ht 67.72 in | Wt 173.0 lb

## 2016-10-31 DIAGNOSIS — Z0000 Encounter for general adult medical examination without abnormal findings: Secondary | ICD-10-CM

## 2016-10-31 DIAGNOSIS — R079 Chest pain, unspecified: Secondary | ICD-10-CM | POA: Diagnosis not present

## 2016-10-31 DIAGNOSIS — I6521 Occlusion and stenosis of right carotid artery: Secondary | ICD-10-CM

## 2016-10-31 DIAGNOSIS — I10 Essential (primary) hypertension: Secondary | ICD-10-CM

## 2016-10-31 DIAGNOSIS — E7800 Pure hypercholesterolemia, unspecified: Secondary | ICD-10-CM

## 2016-10-31 DIAGNOSIS — C569 Malignant neoplasm of unspecified ovary: Secondary | ICD-10-CM

## 2016-10-31 DIAGNOSIS — Z13220 Encounter for screening for lipoid disorders: Secondary | ICD-10-CM

## 2016-10-31 DIAGNOSIS — Z1329 Encounter for screening for other suspected endocrine disorder: Secondary | ICD-10-CM

## 2016-10-31 MED ORDER — AMLODIPINE BESYLATE 5 MG PO TABS: 5.0000 mg | ORAL_TABLET | Freq: Every day | ORAL | 4 refills | Status: DC

## 2016-10-31 MED ORDER — LORAZEPAM 1 MG PO TABS: 1.0000 mg | ORAL_TABLET | Freq: Every day | ORAL | 0 refills | Status: DC | PRN

## 2016-10-31 NOTE — Assessment & Plan Note (Signed)
Starting radiation therapy next week.

## 2016-10-31 NOTE — Progress Notes (Signed)
BP (!) 158/88   Pulse 95   Ht 5' 7.72" (1.72 m)   Wt 173 lb (78.5 kg)   SpO2 98%   BMI 26.52 kg/m    Subjective:    Patient ID: Diane Acosta, female    DOB: 01/10/1961, 56 y.o.   MRN: [id number]  HPI: Diane Acosta is a 56 y.o. female  Chief Complaint  Patient presents with  . Annual Exam  Patient follow-up biggest concern has been third recurrence of ovarian cancer. Patient getting ready for radiation therapy working with [place] on these matters.  Patient doing well otherwise has been taking coQ10 and dietary modification for cholesterol management without problems blood pressure elevated initially  Patient also with some nonspecific chest pressure heaviness sensations not necessarily with activity sometimes at rest sometimes activity no other associated symptoms radiation nausea vomiting diaphoresis. Confusing picture with known atherosclerosis of carotid artery and aorta multiple chemotherapeutic agents. No CHF symptoms PND orthopnea edema.    Relevant past medical, surgical, family and social history reviewed and updated as indicated. Interim medical history since our last visit reviewed. Allergies and medications reviewed and updated.  Review of Systems  Constitutional: Negative.   HENT: Negative.   Eyes: Negative.   Respiratory: Negative.   Cardiovascular: Negative.   Gastrointestinal: Negative.   Endocrine: Negative.   Genitourinary: Negative.   Musculoskeletal: Negative.   Skin: Negative.   Allergic/Immunologic: Negative.   Neurological: Negative.   Hematological: Negative.   Psychiatric/Behavioral: Negative.     Per HPI unless specifically indicated above     Objective:    BP (!) 158/88   Pulse 95   Ht 5' 7.72" (1.72 m)   Wt 173 lb (78.5 kg)   SpO2 98%   BMI 26.52 kg/m   Wt Readings from Last 3 Encounters:  10/31/16 173 lb (78.5 kg)  08/25/16 172 lb (78 kg)  06/13/16 172 lb 12.8 oz (78.4 kg)    Physical Exam  Constitutional: She is  oriented to person, place, and time. She appears well-developed and well-nourished.  HENT:  Head: Normocephalic and atraumatic.  Right Ear: External ear normal.  Left Ear: External ear normal.  Nose: Nose normal.  Mouth/Throat: Oropharynx is clear and moist.  Eyes: Conjunctivae and EOM are normal. Pupils are equal, round, and reactive to light.  Neck: Normal range of motion. Neck supple. Carotid bruit is not present.  Cardiovascular: Normal rate, regular rhythm and normal heart sounds.   No murmur heard. Pulmonary/Chest: Effort normal and breath sounds normal. She exhibits no mass. Right breast exhibits no mass, no skin change and no tenderness. Left breast exhibits no mass, no skin change and no tenderness. Breasts are symmetrical.  Abdominal: Soft. Bowel sounds are normal. There is no hepatosplenomegaly.  Musculoskeletal: Normal range of motion.  Neurological: She is alert and oriented to person, place, and time.  Skin: No rash noted.  Psychiatric: She has a normal mood and affect. Her behavior is normal. Judgment and thought content normal.   EKG computer read as atrial fibrillation on review no atrial fibrillation normal sinus rhythm  Results for orders placed or performed in visit on 04/28/16  LP+ALT+AST Piccolo, Waived (STAT)  Result Value Ref Range   ALT (SGPT) Piccolo, Waived 21 10 - 47 U/L   AST (SGOT) Piccolo, Waived 38 11 - 38 U/L   Cholesterol Piccolo, Waived 179 <200 mg/dL   HDL Chol Piccolo, Waived 51 (L) >59 mg/dL   Triglycerides Piccolo,Waived 257 (H) <150  mg/dL   Chol/HDL Ratio Piccolo,Waive 3.5 mg/dL   LDL Chol Calc Piccolo Waived 76 <100 mg/dL   VLDL Chol Calc Piccolo,Waive 51 (H) <30 mg/dL  Basic metabolic panel  Result Value Ref Range   Glucose 139 (H) 65 - 99 mg/dL   BUN 10 6 - 24 mg/dL   Creatinine, Ser 0.54 (L) 0.57 - 1.00 mg/dL   GFR calc non Af Amer 107 >59 mL/min/1.73   GFR calc Af Amer 124 >59 mL/min/1.73   BUN/Creatinine Ratio 19 9 - 23   Sodium 142  134 - 144 mmol/L   Potassium 3.8 3.5 - 5.2 mmol/L   Chloride 101 96 - 106 mmol/L   CO2 25 18 - 29 mmol/L   Calcium 9.3 8.7 - 10.2 mg/dL      Assessment & Plan:   Problem List Items Addressed This Visit      Cardiovascular and Mediastinum   Essential (primary) hypertension    The current medical regimen is effective;  continue present plan and medications.       Relevant Medications   amLODipine (NORVASC) 5 MG tablet   Other Relevant Orders   CBC with Differential/Platelet   Comprehensive metabolic panel   Urinalysis, Routine w reflex microscopic   Atherosclerosis of right carotid artery    Patient with no current symptoms      Relevant Medications   amLODipine (NORVASC) 5 MG tablet   Other Relevant Orders   Ambulatory referral to Cardiology     Genitourinary   Ovarian cancer [facility])    Starting radiation therapy next week.      Relevant Medications   LORazepam (ATIVAN) 1 MG tablet     Other   Chest pain    Patient with nonspecific chest pain EKG showing atrial fibrillation but on reviews normal sinus rhythm.  Patient's nonspecific chest pain with known atherosclerosis in the aorta and carotid artery and nonspecific chest symptoms with extensive rounds of chemotherapy will refer to cardiology to further evaluate.      Relevant Orders   EKG 12-Lead (Completed)   Ambulatory referral to Cardiology   Hypercholesterolemia    Pending patient not taking medications      Relevant Medications   amLODipine (NORVASC) 5 MG tablet   Other Relevant Orders   CBC with Differential/Platelet   Comprehensive metabolic panel   Urinalysis, Routine w reflex microscopic    Other Visit Diagnoses    Annual physical exam    -  Primary   Relevant Orders   CBC with Differential/Platelet   Comprehensive metabolic panel   Lipid panel   TSH   Urinalysis, Routine w reflex microscopic   Screening cholesterol level       Relevant Orders   Lipid panel   Thyroid disorder screen         Relevant Orders   TSH       Follow up plan: Return in about 6 months (around 05/02/2017) for BMP,  Lipids, ALT, AST.

## 2016-10-31 NOTE — Assessment & Plan Note (Signed)
Patient with no current symptoms

## 2016-10-31 NOTE — Assessment & Plan Note (Signed)
The current medical regimen is effective;  continue present plan and medications.  

## 2016-10-31 NOTE — Assessment & Plan Note (Signed)
Patient with nonspecific chest pain EKG showing atrial fibrillation but on reviews normal sinus rhythm.  Patient's nonspecific chest pain with known atherosclerosis in the aorta and carotid artery and nonspecific chest symptoms with extensive rounds of chemotherapy will refer to cardiology to further evaluate.

## 2016-10-31 NOTE — Assessment & Plan Note (Signed)
Pending patient not taking medications

## 2016-11-01 ENCOUNTER — Encounter: Payer: Self-pay | Admitting: Family Medicine

## 2016-11-01 LAB — TSH: TSH: 2.5 u[IU]/mL (ref 0.450–4.500)

## 2016-11-01 LAB — MICROSCOPIC EXAMINATION
Bacteria, UA: NONE SEEN
RBC MICROSCOPIC, UA: NONE SEEN /HPF (ref 0–?)

## 2016-11-01 LAB — CBC WITH DIFFERENTIAL/PLATELET
BASOS: 0 %
Basophils Absolute: 0 10*3/uL (ref 0.0–0.2)
EOS (ABSOLUTE): 0.2 10*3/uL (ref 0.0–0.4)
EOS: 2 %
HEMATOCRIT: 43.2 % (ref 34.0–46.6)
Hemoglobin: 14.2 g/dL (ref 11.1–15.9)
Immature Grans (Abs): 0 10*3/uL (ref 0.0–0.1)
Immature Granulocytes: 0 %
LYMPHS ABS: 3.1 10*3/uL (ref 0.7–3.1)
Lymphs: 31 %
MCH: 28.2 pg (ref 26.6–33.0)
MCHC: 32.9 g/dL (ref 31.5–35.7)
MCV: 86 fL (ref 79–97)
MONOS ABS: 0.9 10*3/uL (ref 0.1–0.9)
Monocytes: 8 %
NEUTROS PCT: 59 %
Neutrophils Absolute: 5.9 10*3/uL (ref 1.4–7.0)
PLATELETS: 325 10*3/uL (ref 150–379)
RBC: 5.03 x10E6/uL (ref 3.77–5.28)
RDW: 14.3 % (ref 12.3–15.4)
WBC: 10.2 10*3/uL (ref 3.4–10.8)

## 2016-11-01 LAB — COMPREHENSIVE METABOLIC PANEL
A/G RATIO: 1.3 (ref 1.2–2.2)
ALK PHOS: 120 IU/L — AB (ref 39–117)
ALT: 18 IU/L (ref 0–32)
AST: 17 IU/L (ref 0–40)
Albumin: 4.3 g/dL (ref 3.5–5.5)
BUN/Creatinine Ratio: 13 (ref 9–23)
BUN: 9 mg/dL (ref 6–24)
Bilirubin Total: <0.2 mg/dL (ref 0.0–1.2)
CHLORIDE: 98 mmol/L (ref 96–106)
CO2: 27 mmol/L (ref 18–29)
Calcium: 9.5 mg/dL (ref 8.7–10.2)
Creatinine, Ser: 0.68 mg/dL (ref 0.57–1.00)
GFR calc Af Amer: 114 mL/min/{1.73_m2} (ref >59)
GFR calc non Af Amer: 99 mL/min/{1.73_m2} (ref >59)
GLOBULIN, TOTAL: 3.3 g/dL (ref 1.5–4.5)
Glucose: 118 mg/dL — ABNORMAL HIGH (ref 65–99)
POTASSIUM: 3.6 mmol/L (ref 3.5–5.2)
SODIUM: 141 mmol/L (ref 134–144)
Total Protein: 7.6 g/dL (ref 6.0–8.5)

## 2016-11-01 LAB — URINALYSIS, ROUTINE W REFLEX MICROSCOPIC
BILIRUBIN UA: NEGATIVE
GLUCOSE, UA: NEGATIVE
KETONES UA: NEGATIVE
Nitrite, UA: NEGATIVE
Protein, UA: NEGATIVE
RBC UA: NEGATIVE
SPEC GRAV UA: 1.015 (ref 1.005–1.030)
UUROB: 0.2 mg/dL (ref 0.2–1.0)
pH, UA: 7.5 (ref 5.0–7.5)

## 2016-11-01 LAB — LIPID PANEL
CHOLESTEROL TOTAL: 222 mg/dL — AB (ref 100–199)
Chol/HDL Ratio: 4.7 ratio — ABNORMAL HIGH (ref 0.0–4.4)
HDL: 47 mg/dL (ref >39)
LDL Calculated: 123 mg/dL — ABNORMAL HIGH (ref 0–99)
TRIGLYCERIDES: 262 mg/dL — AB (ref 0–149)
VLDL Cholesterol Cal: 52 mg/dL — ABNORMAL HIGH (ref 5–40)

## 2017-01-06 ENCOUNTER — Telehealth: Payer: Self-pay | Admitting: Family Medicine

## 2017-01-06 NOTE — Telephone Encounter (Signed)
Spoke with Merrie Roof, P.A. Who gave verbal okay to call in 4 amlodipine tablets 5 mg for patient. Called [facility] at [facility] with order.

## 2017-03-06 ENCOUNTER — Telehealth: Payer: Self-pay

## 2017-03-06 NOTE — Telephone Encounter (Signed)
Pt's CTE physician results form is completed. It has been copied and placed at front desk for pick up. Left message to inform pt.

## 2017-04-03 ENCOUNTER — Encounter: Payer: Self-pay | Admitting: Family Medicine

## 2017-04-03 ENCOUNTER — Ambulatory Visit (INDEPENDENT_AMBULATORY_CARE_PROVIDER_SITE_OTHER): Payer: BC Managed Care – PPO | Admitting: Family Medicine

## 2017-04-03 VITALS — BP 149/84 | HR 102 | Wt 172.0 lb

## 2017-04-03 DIAGNOSIS — I6521 Occlusion and stenosis of right carotid artery: Secondary | ICD-10-CM

## 2017-04-03 DIAGNOSIS — E7800 Pure hypercholesterolemia, unspecified: Secondary | ICD-10-CM | POA: Diagnosis not present

## 2017-04-03 DIAGNOSIS — I10 Essential (primary) hypertension: Secondary | ICD-10-CM | POA: Diagnosis not present

## 2017-04-03 DIAGNOSIS — C569 Malignant neoplasm of unspecified ovary: Secondary | ICD-10-CM | POA: Diagnosis not present

## 2017-04-03 LAB — LP+ALT+AST PICCOLO, WAIVED
ALT (SGPT) Piccolo, Waived: 34 U/L (ref 10–47)
AST (SGOT) Piccolo, Waived: 35 U/L (ref 11–38)
CHOL/HDL RATIO PICCOLO,WAIVE: 4.2 mg/dL
CHOLESTEROL PICCOLO, WAIVED: 201 mg/dL — AB (ref <200)
HDL CHOL PICCOLO, WAIVED: 48 mg/dL — AB (ref >59)
LDL CHOL CALC PICCOLO WAIVED: 81 mg/dL (ref <100)
TRIGLYCERIDES PICCOLO,WAIVED: 362 mg/dL — AB (ref <150)
VLDL CHOL CALC PICCOLO,WAIVE: 72 mg/dL — AB (ref <30)

## 2017-04-03 MED ORDER — AMLODIPINE BESYLATE 10 MG PO TABS: 10.0000 mg | ORAL_TABLET | Freq: Every day | ORAL | 3 refills | Status: DC

## 2017-04-03 NOTE — Assessment & Plan Note (Signed)
The current medical regimen is effective;  continue present plan and medications.  

## 2017-04-03 NOTE — Progress Notes (Signed)
BP (!) 149/84   Pulse (!) 102   Wt 172 lb (78 kg)   SpO2 99%   BMI 26.37 kg/m    Subjective:    Patient ID: Diane Acosta, female    DOB: [birth year], 56 y.o.   MRN: [id number]  HPI: Diane Acosta is a 56 y.o. female  Chief Complaint  Patient presents with  . Follow-up  . Hypertension         Patient with long-standing hypertension taking amlodipine 10 mg our computer says 5mg . Change computer and prescription of the drugstore to say 10 mg. Patient home blood pressure checks doing well has consistent white coat hypertension. No complaints of edema. Patient followed by ovarian cancer recurrent metastatic by [facility]. Stable for now with good reports.                                                                                                                                                                                                                      Relevant past medical, surgical, family and social history reviewed and updated as indicated. Interim medical history since our last visit reviewed. Allergies and medications reviewed and updated.  Review of Systems  Constitutional: Negative.   Respiratory: Negative.   Cardiovascular: Negative.     Per HPI unless specifically indicated above     Objective:    BP (!) 149/84   Pulse (!) 102   Wt 172 lb (78 kg)   SpO2 99%   BMI 26.37 kg/m   Wt Readings from Last 3 Encounters:  04/03/17 172 lb (78 kg)  10/31/16 173 lb (78.5 kg)  08/25/16 172 lb (78 kg)    Physical Exam  Constitutional: She is oriented to person, place, and time. She appears well-developed and well-nourished.  HENT:  Head: Normocephalic and atraumatic.  Eyes: Conjunctivae and EOM are normal.  Neck: Normal range of motion.  Cardiovascular: Normal rate, regular rhythm and normal heart sounds.   Pulmonary/Chest: Effort normal and breath sounds normal.  Musculoskeletal: Normal range of motion.  Neurological: She is alert and oriented to person, place,  and time.  Skin: No erythema.  Psychiatric: She has a normal mood and affect. Her behavior is normal. Judgment and thought content normal.    Results for orders placed or performed in visit on 04/03/17  LP+ALT+AST Piccolo, [employer]  Result Value Ref Range   ALT (SGPT) Piccolo, Waived 34 10 - 47 U/L   AST (SGOT) Piccolo, Waived 35 11 - 38  U/L   Cholesterol Piccolo, Waived 201 (H) <200 mg/dL   HDL Chol Piccolo, Waived 48 (L) >59 mg/dL   Triglycerides Piccolo,Waived 362 (H) <150 mg/dL   Chol/HDL Ratio Piccolo,Waive 4.2 mg/dL   LDL Chol Calc Piccolo Waived 81 <100 mg/dL   VLDL Chol Calc Piccolo,Waive 72 (H) <30 mg/dL      Assessment & Plan:   Problem List Items Addressed This Visit      Cardiovascular and Mediastinum   Essential (primary) hypertension - Primary    The current medical regimen is effective;  continue present plan and medications.       Relevant Medications   amLODipine (NORVASC) 10 MG tablet   Other Relevant Orders   LP+ALT+AST Piccolo, Waived (Completed)   Atherosclerosis of right carotid artery    stable      Relevant Medications   amLODipine (NORVASC) 10 MG tablet     Genitourinary   Ovarian cancer (HCC)    Stable by reports from [facility]        Other   Hypercholesterolemia    The current medical regimen is effective;  continue present plan and medications.       Relevant Medications   amLODipine (NORVASC) 10 MG tablet   Other Relevant Orders   LP+ALT+AST Piccolo, Waived (Completed)       Follow up plan: Return in about 6 months (around 10/01/2017) for Physical Exam.

## 2017-04-03 NOTE — Assessment & Plan Note (Signed)
stable °

## 2017-04-03 NOTE — Assessment & Plan Note (Signed)
Stable by reports from [facility]

## 2017-08-17 LAB — HM MAMMOGRAPHY

## 2017-08-31 DIAGNOSIS — Z6826 Body mass index (BMI) 26.0-26.9, adult: Secondary | ICD-10-CM | POA: Diagnosis not present

## 2017-08-31 DIAGNOSIS — C569 Malignant neoplasm of unspecified ovary: Secondary | ICD-10-CM | POA: Diagnosis not present

## 2017-09-18 ENCOUNTER — Encounter: Payer: Self-pay | Admitting: Family Medicine

## 2017-09-18 ENCOUNTER — Ambulatory Visit (INDEPENDENT_AMBULATORY_CARE_PROVIDER_SITE_OTHER): Payer: Commercial Managed Care - PPO | Admitting: Family Medicine

## 2017-09-18 ENCOUNTER — Other Ambulatory Visit: Payer: Self-pay

## 2017-09-18 VITALS — BP 127/76 | HR 98 | Temp 100.7°F | Wt 169.2 lb

## 2017-09-18 DIAGNOSIS — J101 Influenza due to other identified influenza virus with other respiratory manifestations: Secondary | ICD-10-CM

## 2017-09-18 LAB — VERITOR FLU A/B WAIVED
Influenza A: POSITIVE — AB
Influenza B: NEGATIVE

## 2017-09-18 MED ORDER — LIDOCAINE VISCOUS 2 % MT SOLN: 5.0000 mL | OROMUCOSAL | 0 refills | Status: DC | PRN

## 2017-09-18 MED ORDER — BALOXAVIR MARBOXIL(40 MG DOSE) 2 X 20 MG PO TBPK: 40.0000 mg | ORAL_TABLET | Freq: Once | ORAL | 0 refills | Status: AC

## 2017-09-18 MED ORDER — HYDROCOD POLST-CPM POLST ER 10-8 MG/5ML PO SUER: 5.0000 mL | Freq: Two times a day (BID) | ORAL | 0 refills | Status: DC | PRN

## 2017-09-18 MED ORDER — BENZONATATE 200 MG PO CAPS: 200.0000 mg | ORAL_CAPSULE | Freq: Three times a day (TID) | ORAL | 0 refills | Status: DC | PRN

## 2017-09-18 NOTE — Progress Notes (Signed)
   BP 127/76 (BP Location: Right Arm, Patient Position: Sitting, Cuff Size: Normal)   Pulse 98   Temp (!) 100.7 F (38.2 C) (Tympanic)   Wt 169 lb 3.2 oz (76.7 kg)   SpO2 95%   BMI 25.94 kg/m    Subjective:    Patient ID: Diane Acosta, female    DOB: 09-Aug-1960, 57 y.o.   MRN: [id number]  HPI: Diane Acosta is a 57 y.o. female  Chief Complaint  Patient presents with  . Cough    Started last night.  . Generalized Body Aches  . Fatigue  . Fever  . Nasal Congestion   Pt with fever, chills, generalized body aches, fatigue, congestion, hacking cough, and severe sore throat x 1 day. Denies CP, SOB, N/V, ear pain.Taking some mucinex and ibuprofen with mild relief. Husband also sick with same sxs. UTD on flu vaccine.   Relevant past medical, surgical, family and social history reviewed and updated as indicated. Interim medical history since our last visit reviewed. Allergies and medications reviewed and updated.  Review of Systems  Per HPI unless specifically indicated above     Objective:    BP 127/76 (BP Location: Right Arm, Patient Position: Sitting, Cuff Size: Normal)   Pulse 98   Temp (!) 100.7 F (38.2 C) (Tympanic)   Wt 169 lb 3.2 oz (76.7 kg)   SpO2 95%   BMI 25.94 kg/m   Wt Readings from Last 3 Encounters:  09/18/17 169 lb 3.2 oz (76.7 kg)  04/03/17 172 lb (78 kg)  10/31/16 173 lb (78.5 kg)    Physical Exam  Constitutional: She appears well-developed and well-nourished.  Appears mildly lethargic, ill  HENT:  Head: Atraumatic.  Right Ear: External ear normal.  Left Ear: External ear normal.  Mouth/Throat: No oropharyngeal exudate.  Oropharynx and nasal mucosa erythematous with rhinorrhea present  Eyes: Conjunctivae are normal. Pupils are equal, round, and reactive to light. No scleral icterus.  Neck: Normal range of motion. Neck supple.  Cardiovascular: Normal rate and normal heart sounds.  Pulmonary/Chest: Effort normal and breath sounds normal. She has  no wheezes.  Musculoskeletal: Normal range of motion.  Lymphadenopathy:    She has no cervical adenopathy.  Neurological: She is alert.  Skin: Skin is warm. She is diaphoretic (mildly).  Psychiatric: She has a normal mood and affect. Her behavior is normal.  Nursing note and vitals reviewed.   Results for orders placed or performed in visit on 09/18/17  Veritor Flu A/B Waived  Result Value Ref Range   Influenza A Positive (A) Negative   Influenza B Negative Negative      Assessment & Plan:   Problem List Items Addressed This Visit    None    Visit Diagnoses    Influenza A    -  Primary   Will tx with xofluza, tessalon, tussionex, and viscous lidocaine. OTC pain relievers and push fluids, sedation precautions reviewed with syrup. F/u if no better   Relevant Orders   Veritor Flu A/B Waived (Completed)       Follow up plan: Return if symptoms worsen or fail to improve.

## 2017-09-21 NOTE — Patient Instructions (Signed)
Follow up as needed

## 2017-10-02 DIAGNOSIS — D2372 Other benign neoplasm of skin of left lower limb, including hip: Secondary | ICD-10-CM | POA: Diagnosis not present

## 2017-10-02 DIAGNOSIS — L821 Other seborrheic keratosis: Secondary | ICD-10-CM | POA: Diagnosis not present

## 2017-10-02 DIAGNOSIS — D2371 Other benign neoplasm of skin of right lower limb, including hip: Secondary | ICD-10-CM | POA: Diagnosis not present

## 2017-10-09 ENCOUNTER — Telehealth: Payer: Self-pay | Admitting: Family Medicine

## 2017-10-09 MED ORDER — DOXYCYCLINE HYCLATE 100 MG PO TABS: 200.0000 mg | ORAL_TABLET | Freq: Once | ORAL | 1 refills | Status: AC

## 2017-10-09 NOTE — Telephone Encounter (Signed)
She really does appreciate all you have done

## 2017-10-09 NOTE — Telephone Encounter (Signed)
°  Pt is asking if on prescription there should only be 2 pills and she takes at the same time.

## 2017-10-09 NOTE — Telephone Encounter (Signed)
Had a tick bite this morning. Wants to know if there is something she can take to avoid tick fever.   Will send in 200mg  doxycycline- this is the prophylactic dose. (She should take it with food to avoid a nasty stomach ache) If she starts feeling like she has the flu in 4-6 weeks, let us know and we'll treat her longer. Please let her know!

## 2017-10-09 NOTE — Telephone Encounter (Signed)
Message relayed to patient. Verbalized understanding and denied questions.   

## 2017-10-10 NOTE — Telephone Encounter (Signed)
Message left for patient verifying only 2 tablets.

## 2017-10-31 DIAGNOSIS — I6521 Occlusion and stenosis of right carotid artery: Secondary | ICD-10-CM | POA: Diagnosis not present

## 2017-11-02 ENCOUNTER — Ambulatory Visit (INDEPENDENT_AMBULATORY_CARE_PROVIDER_SITE_OTHER): Payer: Commercial Managed Care - PPO | Admitting: Family Medicine

## 2017-11-02 ENCOUNTER — Encounter: Payer: Self-pay | Admitting: Family Medicine

## 2017-11-02 DIAGNOSIS — T451X5A Adverse effect of antineoplastic and immunosuppressive drugs, initial encounter: Secondary | ICD-10-CM

## 2017-11-02 DIAGNOSIS — I10 Essential (primary) hypertension: Secondary | ICD-10-CM | POA: Diagnosis not present

## 2017-11-02 DIAGNOSIS — G620 Drug-induced polyneuropathy: Secondary | ICD-10-CM

## 2017-11-02 DIAGNOSIS — E7800 Pure hypercholesterolemia, unspecified: Secondary | ICD-10-CM

## 2017-11-02 MED ORDER — HYDROCHLOROTHIAZIDE 12.5 MG PO TABS: 12.5000 mg | ORAL_TABLET | Freq: Every day | ORAL | 1 refills | Status: DC

## 2017-11-02 MED ORDER — EPINEPHRINE 0.3 MG/0.3ML IJ SOAJ
0.3000 mg | Freq: Once | INTRAMUSCULAR | Status: DC
Start: 2017-11-02 — End: 2017-11-06

## 2017-11-02 MED ORDER — AMLODIPINE BESYLATE 5 MG PO TABS: 5.0000 mg | ORAL_TABLET | Freq: Every day | ORAL | 1 refills | Status: DC

## 2017-11-02 MED ORDER — GABAPENTIN 100 MG PO CAPS: 100.0000 mg | ORAL_CAPSULE | Freq: Three times a day (TID) | ORAL | 3 refills | Status: DC

## 2017-11-02 NOTE — Assessment & Plan Note (Signed)
Discussed peripheral neuropathy and getting worse patient ready to try low-dose gabapentin will start with 100 mg may increase to 300 mg but may need more.

## 2017-11-02 NOTE — Progress Notes (Signed)
There were no vitals taken for this visit.   Subjective:    Patient ID: Diane Acosta, female    DOB: 1961-01-19, 57 y.o.   MRN: [id number]  HPI: Diane Acosta is a 57 y.o. female  Annual exam  Patient follow-up works with GYN oncology at [facility] for ovarian CA which is stable for now. Patient working with cardiology for hypertension control has been on amlodipine 10 mg and Crestor 10 mg every other day had side effects of feet swelling and stopped the Crestor and the feet swelling improved but still has some residual feet ankle swelling. Patient with long-standing whitecoat hypertension is in the process of monitoring home blood pressure to make sure adequate control. Cholesterol will need modification because of carotid artery stenosis. Patient now taking Zetia. Patient also with some residual neuropathy changes in both feet felt to be secondary to chemotherapy has been reluctant to try medication but now is ready to try extra low-dose.  Relevant past medical, surgical, family and social history reviewed and updated as indicated. Interim medical history since our last visit reviewed. Allergies and medications reviewed and updated.  Review of Systems  Constitutional: Negative.   HENT: Negative.   Eyes: Negative.   Respiratory: Negative.   Cardiovascular: Negative.   Gastrointestinal: Negative.   Endocrine: Negative.   Genitourinary: Negative.   Musculoskeletal: Negative.   Skin: Negative.   Allergic/Immunologic: Negative.   Neurological: Negative.   Hematological: Negative.   Psychiatric/Behavioral: Negative.     Per HPI unless specifically indicated above     Objective:    There were no vitals taken for this visit.  Wt Readings from Last 3 Encounters:  09/18/17 169 lb 3.2 oz (76.7 kg)  04/03/17 172 lb (78 kg)  10/31/16 173 lb (78.5 kg)    Physical Exam  Constitutional: She is oriented to person, place, and time. She appears well-developed and well-nourished.  HENT:    Head: Normocephalic and atraumatic.  Right Ear: External ear normal.  Left Ear: External ear normal.  Nose: Nose normal.  Mouth/Throat: Oropharynx is clear and moist.  Eyes: Pupils are equal, round, and reactive to light. Conjunctivae and EOM are normal.  Neck: Normal range of motion. Neck supple. Carotid bruit is not present.  Cardiovascular: Normal rate, regular rhythm and normal heart sounds.  No murmur heard. Pulmonary/Chest: Effort normal and breath sounds normal. She exhibits no mass. Right breast exhibits no mass, no skin change and no tenderness. Left breast exhibits no mass, no skin change and no tenderness. Breasts are symmetrical.  Abdominal: Soft. Bowel sounds are normal. There is no hepatosplenomegaly.  Musculoskeletal: Normal range of motion. She exhibits edema.  1+ ankle edema  Neurological: She is alert and oriented to person, place, and time.  Skin: No rash noted.  Psychiatric: She has a normal mood and affect. Her behavior is normal. Judgment and thought content normal.    Results for orders placed or performed in visit on 09/18/17  Veritor Flu A/B Waived  Result Value Ref Range   Influenza A Positive (A) Negative   Influenza B Negative Negative      Assessment & Plan:   Problem List Items Addressed This Visit      Cardiovascular and Mediastinum   Essential (primary) hypertension    Discussed hypertension and edema.  Amlodipine 10 mg may be contributing will decrease from 10 mg to 5 mg. Blood pressure predictably will get worse.  Patient will have a prescription for hydrochlorothiazide 12.5 mg to  start in 1 week or so depending on blood pressure response.  May need to go to 25 mg of hydrochlorothiazide Will be following up blood pressure with cardiologist at [facility].  She will be keeping a blood pressure log      Relevant Medications   amLODipine (NORVASC) 5 MG tablet   hydrochlorothiazide (HYDRODIURIL) 12.5 MG tablet   EPINEPHrine (EPI-PEN) injection 0.3 mg      Nervous and Auditory   Peripheral neuropathy due to chemotherapy [facility])    Discussed peripheral neuropathy and getting worse patient ready to try low-dose gabapentin will start with 100 mg may increase to 300 mg but may need more.      Relevant Medications   gabapentin (NEURONTIN) 100 MG capsule     Other   Hypercholesterolemia    Discussed cholesterol need for management to minimize progression of atherosclerosis of her right carotid and stroke prevention.  Discussed that zetia will not be adequate to get to goal and patient is following with cardiology at [facility].      Relevant Medications   amLODipine (NORVASC) 5 MG tablet   hydrochlorothiazide (HYDRODIURIL) 12.5 MG tablet   EPINEPHrine (EPI-PEN) injection 0.3 mg       Follow up plan: Return in about 6 months (around 05/04/2018) for BMP,  Lipids, ALT, AST.

## 2017-11-02 NOTE — Assessment & Plan Note (Signed)
Discussed cholesterol need for management to minimize progression of atherosclerosis of her right carotid and stroke prevention.  Discussed that zetia will not be adequate to get to goal and patient is following with cardiology at [facility].

## 2017-11-02 NOTE — Assessment & Plan Note (Signed)
Discussed hypertension and edema.  Amlodipine 10 mg may be contributing will decrease from 10 mg to 5 mg. Blood pressure predictably will get worse.  Patient will have a prescription for hydrochlorothiazide 12.5 mg to start in 1 week or so depending on blood pressure response.  May need to go to 25 mg of hydrochlorothiazide Will be following up blood pressure with cardiologist at [facility].  She will be keeping a blood pressure log

## 2017-11-03 ENCOUNTER — Telehealth: Payer: Self-pay | Admitting: Family Medicine

## 2017-11-03 LAB — TSH: TSH: 1.59 u[IU]/mL (ref 0.450–4.500)

## 2017-11-03 NOTE — Telephone Encounter (Signed)
Copied from [place] [id number]. Topic: Quick Communication - Rx Refill/Question >> Nov 03, 2017  2:19 PM Oliver Pila B wrote: Medication: EPINEPHrine (EPI-PEN) injection 0.3 mg   [[id number]]  Has the patient contacted their pharmacy? Yes.   (Agent: If no, request that the patient contact the pharmacy for the refill.) Preferred Pharmacy (with phone number or street name): [facility] in [place]: Please be advised that RX refills may take up to 3 business days. We ask that you follow-up with your pharmacy.

## 2017-11-04 NOTE — Telephone Encounter (Signed)
Epinephrine prescription not sent; last OV 11/02/17  Pharmacy: [facility] [place], [place] [phone] (Phone) [phone] (Fax)

## 2017-11-06 ENCOUNTER — Encounter: Payer: Self-pay | Admitting: Family Medicine

## 2017-11-06 MED ORDER — EPINEPHRINE 0.3 MG/0.3ML IJ SOAJ: 0.3000 mg | Freq: Once | INTRAMUSCULAR | 3 refills | Status: AC

## 2017-11-15 DIAGNOSIS — C569 Malignant neoplasm of unspecified ovary: Secondary | ICD-10-CM | POA: Diagnosis not present

## 2017-11-15 DIAGNOSIS — Z9221 Personal history of antineoplastic chemotherapy: Secondary | ICD-10-CM | POA: Diagnosis not present

## 2017-11-15 DIAGNOSIS — R197 Diarrhea, unspecified: Secondary | ICD-10-CM | POA: Diagnosis not present

## 2017-11-15 DIAGNOSIS — Z6826 Body mass index (BMI) 26.0-26.9, adult: Secondary | ICD-10-CM | POA: Diagnosis not present

## 2017-11-23 DIAGNOSIS — Z8542 Personal history of malignant neoplasm of other parts of uterus: Secondary | ICD-10-CM | POA: Diagnosis not present

## 2017-11-23 DIAGNOSIS — C569 Malignant neoplasm of unspecified ovary: Secondary | ICD-10-CM | POA: Diagnosis not present

## 2017-11-23 DIAGNOSIS — Z6826 Body mass index (BMI) 26.0-26.9, adult: Secondary | ICD-10-CM | POA: Diagnosis not present

## 2017-11-23 DIAGNOSIS — I10 Essential (primary) hypertension: Secondary | ICD-10-CM | POA: Diagnosis not present

## 2017-11-23 DIAGNOSIS — Z08 Encounter for follow-up examination after completed treatment for malignant neoplasm: Secondary | ICD-10-CM | POA: Diagnosis not present

## 2018-02-22 DIAGNOSIS — C569 Malignant neoplasm of unspecified ovary: Secondary | ICD-10-CM | POA: Diagnosis not present

## 2018-02-22 DIAGNOSIS — Z6826 Body mass index (BMI) 26.0-26.9, adult: Secondary | ICD-10-CM | POA: Diagnosis not present

## 2018-02-22 DIAGNOSIS — I6521 Occlusion and stenosis of right carotid artery: Secondary | ICD-10-CM | POA: Diagnosis not present

## 2018-04-22 ENCOUNTER — Other Ambulatory Visit: Payer: Self-pay | Admitting: Family Medicine

## 2018-04-22 DIAGNOSIS — I10 Essential (primary) hypertension: Secondary | ICD-10-CM

## 2018-04-23 NOTE — Telephone Encounter (Signed)
Requested Prescriptions  Pending Prescriptions Disp Refills  . amLODipine (NORVASC) 5 MG tablet [Pharmacy Med Name: AMLODIPINE BESYLATE 5MG  TABLETS] 90 tablet 3    Sig: TAKE 1 TABLET(5 MG) BY MOUTH DAILY     Cardiovascular:  Calcium Channel Blockers Failed - 04/22/2018  3:14 AM      Failed - Last BP in normal range    BP Readings from Last 1 Encounters:  11/02/17 (!) 155/86         Passed - Valid encounter within last 6 months    Recent Outpatient Visits          5 months ago Essential (primary) hypertension   [facility] [place], Jeannette How, MD   7 months ago Influenza [place], Lilia Argue, [place]   1 year ago Essential (primary) hypertension   [facility] Crissman, Jeannette How, MD   1 year ago Annual physical exam   [facility] Crissman, Jeannette How, MD   1 year ago Upper respiratory tract infection, unspecified type   [facility], Lilia Argue, [place]      Future Appointments            In 2 weeks Criss[place], Jeannette How, MD [facility], [facility]

## 2018-05-07 ENCOUNTER — Ambulatory Visit (INDEPENDENT_AMBULATORY_CARE_PROVIDER_SITE_OTHER): Payer: Commercial Managed Care - PPO | Admitting: Family Medicine

## 2018-05-07 ENCOUNTER — Encounter: Payer: Self-pay | Admitting: Family Medicine

## 2018-05-07 VITALS — BP 144/76 | HR 107 | Wt 174.0 lb

## 2018-05-07 DIAGNOSIS — I6521 Occlusion and stenosis of right carotid artery: Secondary | ICD-10-CM

## 2018-05-07 DIAGNOSIS — E7800 Pure hypercholesterolemia, unspecified: Secondary | ICD-10-CM

## 2018-05-07 DIAGNOSIS — Z23 Encounter for immunization: Secondary | ICD-10-CM

## 2018-05-07 DIAGNOSIS — C569 Malignant neoplasm of unspecified ovary: Secondary | ICD-10-CM

## 2018-05-07 DIAGNOSIS — Z1159 Encounter for screening for other viral diseases: Secondary | ICD-10-CM

## 2018-05-07 DIAGNOSIS — I10 Essential (primary) hypertension: Secondary | ICD-10-CM

## 2018-05-07 DIAGNOSIS — Z114 Encounter for screening for human immunodeficiency virus [HIV]: Secondary | ICD-10-CM

## 2018-05-07 LAB — LP+ALT+AST PICCOLO, WAIVED
ALT (SGPT) Piccolo, Waived: 26 U/L (ref 10–47)
AST (SGOT) Piccolo, Waived: 33 U/L (ref 11–38)
CHOL/HDL RATIO PICCOLO,WAIVE: 3.9 mg/dL
Cholesterol Piccolo, Waived: 186 mg/dL (ref <200)
HDL Chol Piccolo, Waived: 48 mg/dL — ABNORMAL LOW (ref >59)
LDL CHOL CALC PICCOLO WAIVED: 73 mg/dL (ref <100)
TRIGLYCERIDES PICCOLO,WAIVED: 327 mg/dL — AB (ref <150)
VLDL Chol Calc Piccolo,Waive: 65 mg/dL — ABNORMAL HIGH (ref <30)

## 2018-05-07 MED ORDER — EZETIMIBE 10 MG PO TABS: 10.0000 mg | ORAL_TABLET | Freq: Every day | ORAL | 4 refills | Status: DC

## 2018-05-07 NOTE — Assessment & Plan Note (Signed)
Stable followed by oncology at [facility]

## 2018-05-07 NOTE — Progress Notes (Signed)
BP (!) 144/76 (BP Location: Left Arm)   Pulse (!) 107   Wt 174 lb (78.9 kg)   SpO2 99%   BMI 27.31 kg/m    Subjective:    Patient ID: Diane Acosta, female    DOB: [birth year], 57 y.o.   MRN: [id number]  HPI: Diane Acosta is a 57 y.o. female  Chief Complaint  Patient presents with  . Follow-up  Patient follow-up hypertension doing well whitecoat hypertension persists patient's blood pressures at home have been consistently good. Taking Zetia without problems.  Has seen cardiology at [facility] and due to hassles of time and place wants to come to [facility] instead we will set up an appointment.  Patient's coronary artery disease has been stable with no angina symptoms or complaints. Reviewed cardiology notes with conclusion of normal stress test, normal echo stress. Recommending cholesterol treatment and blood pressure control.   Relevant past medical, surgical, family and social history reviewed and updated as indicated. Interim medical history since our last visit reviewed. Allergies and medications reviewed and updated.  Review of Systems  Constitutional: Negative.   Respiratory: Negative.   Cardiovascular: Negative.     Per HPI unless specifically indicated above     Objective:    BP (!) 144/76 (BP Location: Left Arm)   Pulse (!) 107   Wt 174 lb (78.9 kg)   SpO2 99%   BMI 27.31 kg/m   Wt Readings from Last 3 Encounters:  05/07/18 174 lb (78.9 kg)  11/02/17 170 lb (77.1 kg)  09/18/17 169 lb 3.2 oz (76.7 kg)    Physical Exam  Constitutional: She is oriented to person, place, and time. She appears well-developed and well-nourished.  HENT:  Head: Normocephalic and atraumatic.  Eyes: Conjunctivae and EOM are normal.  Neck: Normal range of motion.  Cardiovascular: Normal rate, regular rhythm and normal heart sounds.  Pulmonary/Chest: Effort normal and breath sounds normal.  Musculoskeletal: Normal range of motion.  Neurological: She is alert and oriented to  person, place, and time.  Skin: No erythema.  Psychiatric: She has a normal mood and affect. Her behavior is normal. Judgment and thought content normal.    Results for orders placed or performed in visit on 01/26/18  HM COLONOSCOPY  Result Value Ref Range   HM Colonoscopy See Report (in chart) See Report (in chart), Patient Reported      Assessment & Plan:   Problem List Items Addressed This Visit      Cardiovascular and Mediastinum   Essential hypertension - Primary    The current medical regimen is effective;  continue present plan and medications. Patient has whitecoat hypertension      Relevant Medications   ezetimibe (ZETIA) 10 MG tablet   Atherosclerosis of right carotid artery    The current medical regimen is effective;  continue present plan and medications. Taking zetia       Relevant Medications   ezetimibe (ZETIA) 10 MG tablet     Endocrine   Ovarian cancer ([place])   Ovarian carcinoma, epithelial ([place])    Stable followed by oncology at [facility]        Other   Hypercholesterolemia    Discussed hypercholesterol patient not in need of cardiology follow-up with negative work-up by cardiology last year.      Relevant Medications   ezetimibe (ZETIA) 10 MG tablet   Other Relevant Orders   Basic metabolic panel   LP+ALT+AST Piccolo, [place]    Other Visit Diagnoses  Need for hepatitis C screening test       Relevant Orders   Hepatitis C Antibody   Encounter for screening for HIV       Relevant Orders   HIV antibody (with reflex)   Needs flu shot       Relevant Orders   Flu Vaccine QUAD 6+ mos PF IM (Fluarix Quad PF)       Follow up plan: Return in about 6 months (around 11/06/2018) for Physical Exam.

## 2018-05-07 NOTE — Assessment & Plan Note (Signed)
The current medical regimen is effective;  continue present plan and medications. Patient has whitecoat hypertension

## 2018-05-07 NOTE — Assessment & Plan Note (Addendum)
The current medical regimen is effective;  continue present plan and medications. Taking zetia

## 2018-05-07 NOTE — Assessment & Plan Note (Signed)
Discussed hypercholesterol patient not in need of cardiology follow-up with negative work-up by cardiology last year.

## 2018-05-08 ENCOUNTER — Encounter: Payer: Self-pay | Admitting: Family Medicine

## 2018-05-08 DIAGNOSIS — E7800 Pure hypercholesterolemia, unspecified: Secondary | ICD-10-CM | POA: Diagnosis not present

## 2018-05-08 DIAGNOSIS — Z23 Encounter for immunization: Secondary | ICD-10-CM | POA: Diagnosis not present

## 2018-05-08 DIAGNOSIS — Z1159 Encounter for screening for other viral diseases: Secondary | ICD-10-CM | POA: Diagnosis not present

## 2018-05-08 LAB — BASIC METABOLIC PANEL
BUN / CREAT RATIO: 24 — AB (ref 9–23)
BUN: 12 mg/dL (ref 6–24)
CALCIUM: 9.5 mg/dL (ref 8.7–10.2)
CO2: 22 mmol/L (ref 20–29)
CREATININE: 0.51 mg/dL — AB (ref 0.57–1.00)
Chloride: 102 mmol/L (ref 96–106)
GFR, EST AFRICAN AMERICAN: 124 mL/min/{1.73_m2} (ref >59)
GFR, EST NON AFRICAN AMERICAN: 108 mL/min/{1.73_m2} (ref >59)
Glucose: 135 mg/dL — ABNORMAL HIGH (ref 65–99)
Potassium: 3.9 mmol/L (ref 3.5–5.2)
Sodium: 141 mmol/L (ref 134–144)

## 2018-05-08 LAB — HEPATITIS C ANTIBODY: Hep C Virus Ab: <0.1 s/co ratio (ref 0.0–0.9)

## 2018-05-08 LAB — HIV ANTIBODY (ROUTINE TESTING W REFLEX): HIV Screen 4th Generation wRfx: NONREACTIVE

## 2018-05-23 DIAGNOSIS — Z6826 Body mass index (BMI) 26.0-26.9, adult: Secondary | ICD-10-CM | POA: Diagnosis not present

## 2018-05-23 DIAGNOSIS — C785 Secondary malignant neoplasm of large intestine and rectum: Secondary | ICD-10-CM | POA: Diagnosis not present

## 2018-05-23 DIAGNOSIS — C7989 Secondary malignant neoplasm of other specified sites: Secondary | ICD-10-CM | POA: Diagnosis not present

## 2018-05-23 DIAGNOSIS — C569 Malignant neoplasm of unspecified ovary: Secondary | ICD-10-CM | POA: Diagnosis not present

## 2018-05-24 DIAGNOSIS — Z6826 Body mass index (BMI) 26.0-26.9, adult: Secondary | ICD-10-CM | POA: Diagnosis not present

## 2018-05-24 DIAGNOSIS — Z923 Personal history of irradiation: Secondary | ICD-10-CM | POA: Diagnosis not present

## 2018-05-24 DIAGNOSIS — C569 Malignant neoplasm of unspecified ovary: Secondary | ICD-10-CM | POA: Diagnosis not present

## 2018-05-24 DIAGNOSIS — N393 Stress incontinence (female) (male): Secondary | ICD-10-CM | POA: Diagnosis not present

## 2018-08-23 DIAGNOSIS — C569 Malignant neoplasm of unspecified ovary: Secondary | ICD-10-CM | POA: Diagnosis not present

## 2018-08-23 DIAGNOSIS — Z6826 Body mass index (BMI) 26.0-26.9, adult: Secondary | ICD-10-CM | POA: Diagnosis not present

## 2018-10-10 ENCOUNTER — Other Ambulatory Visit: Payer: Self-pay

## 2018-10-10 ENCOUNTER — Encounter: Payer: Self-pay | Admitting: Family Medicine

## 2018-10-10 ENCOUNTER — Ambulatory Visit (INDEPENDENT_AMBULATORY_CARE_PROVIDER_SITE_OTHER): Payer: Commercial Managed Care - PPO | Admitting: Family Medicine

## 2018-10-10 DIAGNOSIS — I10 Essential (primary) hypertension: Secondary | ICD-10-CM | POA: Diagnosis not present

## 2018-10-10 DIAGNOSIS — E7800 Pure hypercholesterolemia, unspecified: Secondary | ICD-10-CM | POA: Diagnosis not present

## 2018-10-10 DIAGNOSIS — C569 Malignant neoplasm of unspecified ovary: Secondary | ICD-10-CM

## 2018-10-10 NOTE — Assessment & Plan Note (Signed)
Stable for now and with compromised immune status discussed importance of COVID-19 precautions and social distancing.

## 2018-10-10 NOTE — Progress Notes (Signed)
BP 132/84 (BP Location: Left Arm, Patient Position: Sitting, Cuff Size: Normal)   Pulse 86   Ht 5\' 7"  (1.702 m)   Wt 175 lb 3.2 oz (79.5 kg)   BMI 27.44 kg/m    Subjective:    Patient ID: Diane Acosta, female    DOB: Nov 25, 1960, 58 y.o.   MRN: [id number]  HPI: Diane Acosta is a 58 y.o. female  Chief Complaint  Patient presents with  . Annual Exam   Telemedicine using audio and telecommunications for a synchronous communication visit. Today's visit due to COVID-19 isolation precautions I connected with Tish Frederickson  and verified that I am speaking with the correct person using two identifiers.   I discussed the limitations, risks, security and privacy concerns of performing an evaluation and management service by telephone and the availability of in person appointments. I also discussed with the patient that there may be a patient responsible charge related to this service. The patient expressed understanding and agreed to proceed. The patient's location is home I am at home.  Discussed with patient COVID-19 precautions.  Unfortunately patient has not been in strict social distancing and has a daughter who works in the ICU at [facility].  Discussed strict social distancing especially because of patient's history and compromised immune status. Patient other medical problems are stable with blood pressure and taking amlodipine 5 mg without problems. Cholesterol doing well with Zetia without problems.  Relevant past medical, surgical, family and social history reviewed and updated as indicated. Interim medical history since our last visit reviewed. Allergies and medications reviewed and updated.  Review of Systems  Constitutional: Negative.   HENT: Negative.   Eyes: Negative.   Respiratory: Negative.   Cardiovascular: Negative.   Gastrointestinal: Negative.   Endocrine: Negative.   Genitourinary: Negative.   Musculoskeletal: Negative.   Skin: Negative.   Allergic/Immunologic:  Negative.   Neurological: Negative.   Hematological: Negative.   Psychiatric/Behavioral: Negative.     Per HPI unless specifically indicated above     Objective:    BP 132/84 (BP Location: Left Arm, Patient Position: Sitting, Cuff Size: Normal)   Pulse 86   Ht 5\' 7"  (1.702 m)   Wt 175 lb 3.2 oz (79.5 kg)   BMI 27.44 kg/m   Wt Readings from Last 3 Encounters:  10/10/18 175 lb 3.2 oz (79.5 kg)  05/07/18 174 lb (78.9 kg)  11/02/17 170 lb (77.1 kg)    Physical Exam none Results for orders placed or performed in visit on 18/84/16  Basic metabolic panel  Result Value Ref Range   Glucose 135 (H) 65 - 99 mg/dL   BUN 12 6 - 24 mg/dL   Creatinine, Ser 0.51 (L) 0.57 - 1.00 mg/dL   GFR calc non Af Amer 108 >59 mL/min/1.73   GFR calc Af Amer 124 >59 mL/min/1.73   BUN/Creatinine Ratio 24 (H) 9 - 23   Sodium 141 134 - 144 mmol/L   Potassium 3.9 3.5 - 5.2 mmol/L   Chloride 102 96 - 106 mmol/L   CO2 22 20 - 29 mmol/L   Calcium 9.5 8.7 - 10.2 mg/dL  LP+ALT+AST Piccolo, Waived  Result Value Ref Range   ALT (SGPT) Piccolo, Waived 26 10 - 47 U/L   AST (SGOT) Piccolo, Waived 33 11 - 38 U/L   Cholesterol Piccolo, Waived 186 <200 mg/dL   HDL Chol Piccolo, Waived 48 (L) >59 mg/dL   Triglycerides Piccolo,Waived 327 (H) <150 mg/dL   Chol/HDL Ratio Piccolo,Waive  3.9 mg/dL   LDL Chol Calc Piccolo Waived 73 <100 mg/dL   VLDL Chol Calc Piccolo,Waive 65 (H) <30 mg/dL  Hepatitis C Antibody  Result Value Ref Range   Hep C Virus Ab <0.1 0.0 - 0.9 s/co ratio  HIV antibody (with reflex)  Result Value Ref Range   HIV Screen 4th Generation wRfx Non Reactive Non Reactive      Assessment & Plan:   Problem List Items Addressed This Visit      Cardiovascular and Mediastinum   Essential hypertension    The current medical regimen is effective;  continue present plan and medications.         Endocrine   Ovarian carcinoma, epithelial ([place])    Stable for now and with compromised immune status  discussed importance of COVID-19 precautions and social distancing.        Other   Hypercholesterolemia    The current medical regimen is effective;  continue present plan and medications.        COVID-19 precautions and social distancing discussed.  I discussed the assessment and treatment plan with the patient. The patient was provided an opportunity to ask questions and all were answered. The patient agreed with the plan and demonstrated an understanding of the instructions.   The patient was advised to call back or seek an in-person evaluation if the symptoms worsen or if the condition fails to improve as anticipated.   I provided 21+ minutes of time during this encounter. Follow up plan: Return in about 6 months (around 04/11/2019) for Physical Exam.

## 2018-10-10 NOTE — Assessment & Plan Note (Signed)
The current medical regimen is effective;  continue present plan and medications.  

## 2018-11-06 ENCOUNTER — Encounter: Payer: Self-pay | Admitting: Family Medicine

## 2018-11-26 DIAGNOSIS — C569 Malignant neoplasm of unspecified ovary: Secondary | ICD-10-CM | POA: Diagnosis not present

## 2019-04-02 ENCOUNTER — Telehealth: Payer: Self-pay | Admitting: Family Medicine

## 2019-04-02 NOTE — Telephone Encounter (Signed)
Called pt to reschedule physical with Dr. Jeananne Rama. Pt is wanting to know if Dr. Wynetta Emery would be ok with becoming her pcp. Please advise.

## 2019-04-02 NOTE — Telephone Encounter (Signed)
That's fine

## 2019-04-03 ENCOUNTER — Encounter: Payer: Commercial Managed Care - PPO | Admitting: Family Medicine

## 2019-04-11 ENCOUNTER — Encounter: Payer: Self-pay | Admitting: Family Medicine

## 2019-04-11 ENCOUNTER — Other Ambulatory Visit: Payer: Self-pay

## 2019-04-11 ENCOUNTER — Ambulatory Visit (INDEPENDENT_AMBULATORY_CARE_PROVIDER_SITE_OTHER): Payer: Commercial Managed Care - PPO | Admitting: Family Medicine

## 2019-04-11 VITALS — BP 148/88 | HR 99 | Temp 98.9°F

## 2019-04-11 DIAGNOSIS — G620 Drug-induced polyneuropathy: Secondary | ICD-10-CM

## 2019-04-11 DIAGNOSIS — E7800 Pure hypercholesterolemia, unspecified: Secondary | ICD-10-CM | POA: Diagnosis not present

## 2019-04-11 DIAGNOSIS — I10 Essential (primary) hypertension: Secondary | ICD-10-CM | POA: Diagnosis not present

## 2019-04-11 DIAGNOSIS — I6521 Occlusion and stenosis of right carotid artery: Secondary | ICD-10-CM

## 2019-04-11 DIAGNOSIS — Z0000 Encounter for general adult medical examination without abnormal findings: Secondary | ICD-10-CM | POA: Diagnosis not present

## 2019-04-11 DIAGNOSIS — D499 Neoplasm of unspecified behavior of unspecified site: Secondary | ICD-10-CM

## 2019-04-11 DIAGNOSIS — C801 Malignant (primary) neoplasm, unspecified: Secondary | ICD-10-CM | POA: Diagnosis not present

## 2019-04-11 DIAGNOSIS — C569 Malignant neoplasm of unspecified ovary: Secondary | ICD-10-CM

## 2019-04-11 DIAGNOSIS — T451X5A Adverse effect of antineoplastic and immunosuppressive drugs, initial encounter: Secondary | ICD-10-CM

## 2019-04-11 MED ORDER — EZETIMIBE 10 MG PO TABS: 10.0000 mg | ORAL_TABLET | Freq: Every day | ORAL | 1 refills | Status: DC

## 2019-04-11 MED ORDER — AMLODIPINE BESYLATE 5 MG PO TABS: ORAL_TABLET | ORAL | 1 refills | Status: DC

## 2019-04-11 MED ORDER — ASPIRIN 81 MG PO CHEW: 81.0000 mg | CHEWABLE_TABLET | Freq: Every day | ORAL | 4 refills | Status: DC

## 2019-04-11 NOTE — Assessment & Plan Note (Signed)
Will keep BP and cholesterol under good control. Continue to monitor. Due for recheck- last in 2017- ordered today.

## 2019-04-11 NOTE — Assessment & Plan Note (Signed)
Continue to follow with oncology. Call with any concerns. Continue to monitor.  

## 2019-04-11 NOTE — Assessment & Plan Note (Signed)
Under good control on current regimen. Continue current regimen. Continue to monitor. Call with any concerns. Refills given. Labs drawn today.   

## 2019-04-11 NOTE — Assessment & Plan Note (Signed)
Stable. Continue to monitor. Call with any concerns.  ?

## 2019-04-11 NOTE — Patient Instructions (Signed)
Please call your insurance to let them know that your PCP has changed to: Park Liter, DO. Thank you!

## 2019-04-11 NOTE — Progress Notes (Signed)
BP (!) 148/88   Pulse 99   Temp 98.9 F (37.2 C)   SpO2 95%    Subjective:    Patient ID: Diane Acosta, female    DOB: May 31, 1961, 58 y.o.   MRN: [id number]  HPI: Diane Acosta is a 58 y.o. female presenting on 04/11/2019 for comprehensive medical examination. Current medical complaints include:  HYPERTENSION / HYPERLIPIDEMIA Satisfied with current treatment? yes Duration of hypertension: chronic BP monitoring frequency: not checking BP medication side effects: no Past BP meds: amlodipine Duration of hyperlipidemia: chronic Cholesterol medication side effects: no Cholesterol supplements: none Past cholesterol medications: zetia Medication compliance: excellent compliance Aspirin: yes Recent stressors: yes Recurrent headaches: no Visual changes: yes- has been on the computer more and has been off Palpitations: no Dyspnea: no Chest pain: no Lower extremity edema: no Dizzy/lightheaded: no  She currently lives with: husband Menopausal Symptoms: no  Depression Screen done today and results listed below:  Depression screen [facility] 2/9 10/10/2018 05/07/2018 04/03/2017 10/31/2016 04/28/2016  Decreased Interest 0 0 0 0 0  Down, Depressed, Hopeless 0 0 0 0 0  PHQ - 2 Score 0 0 0 0 0  Altered sleeping - - - - 0  Tired, decreased energy - - - - 0  Change in appetite - - - - 0  Feeling bad or failure about yourself  - - - - 0  Trouble concentrating - - - - 0  Moving slowly or fidgety/restless - - - - 0  Suicidal thoughts - - - - 0  PHQ-9 Score - - - - 0    Past Medical History:  Past Medical History:  Diagnosis Date  . Asthma     Surgical History:  Past Surgical History:  Procedure Laterality Date  . ABDOMINAL HYSTERECTOMY    . APPENDECTOMY      Medications:  No current outpatient medications on file prior to visit.   No current facility-administered medications on file prior to visit.     Allergies:  Allergies  Allergen Reactions  . Hydralazine Hcl  Anaphylaxis  . Levofloxacin     Other reaction(s): Other (See Comments) Hypertensive emergency. Other reaction(s): Other (See Comments) Reaction: Hypertensive emergency.    Social History:  Social History   Socioeconomic History  . Marital status: Married    Spouse name: Not on file  . Number of children: 2  . Years of education: Not on file  . Highest education level: Not on file  Occupational History  . Not on file  Social Needs  . Financial resource strain: Not on file  . Food insecurity    Worry: Not on file    Inability: Not on file  . Transportation needs    Medical: Not on file    Non-medical: Not on file  Tobacco Use  . Smoking status: Never Smoker  . Smokeless tobacco: Never Used  Substance and Sexual Activity  . Alcohol use: Yes    Alcohol/week: 0.0 standard drinks    Comment: rare  . Drug use: No  . Sexual activity: Not on file  Lifestyle  . Physical activity    Days per week: Not on file    Minutes per session: Not on file  . Stress: Not on file  Relationships  . Social Herbalist on phone: Not on file    Gets together: Not on file    Attends religious service: Not on file    Active member of club or organization: Not  on file    Attends meetings of clubs or organizations: Not on file    Relationship status: Not on file  . Intimate partner violence    Fear of current or ex partner: Not on file    Emotionally abused: Not on file    Physically abused: Not on file    Forced sexual activity: Not on file  Other Topics Concern  . Not on file  Social History Narrative  . Not on file   Social History   Tobacco Use  Smoking Status Never Smoker  Smokeless Tobacco Never Used   Social History   Substance and Sexual Activity  Alcohol Use Yes  . Alcohol/week: 0.0 standard drinks   Comment: rare    Family History:  Family History  Problem Relation Age of Onset  . Thyroid disease Mother   . Mental illness Mother   . Diverticulitis  Mother   . Diverticulitis Father   . Heart disease Father   . Hypertension Father   . Stroke Paternal Grandmother   . Stroke Paternal Grandfather     Past medical history, surgical history, medications, allergies, family history and social history reviewed with patient today and changes made to appropriate areas of the chart.   Review of Systems  Constitutional: Negative.   HENT: Negative.   Eyes: Negative.   Respiratory: Negative.   Cardiovascular: Negative.   Gastrointestinal: Positive for heartburn (more indigestion). Negative for abdominal pain, blood in stool, constipation, diarrhea, melena, nausea and vomiting.  Genitourinary: Negative.   Musculoskeletal: Negative.   Skin: Negative.   Neurological: Positive for tingling. Negative for dizziness, tremors, sensory change, speech change, focal weakness, seizures, loss of consciousness, weakness and headaches.  Endo/Heme/Allergies: Negative.   Psychiatric/Behavioral: Negative.     All other ROS negative except what is listed above and in the HPI.      Objective:    BP (!) 148/88   Pulse 99   Temp 98.9 F (37.2 C)   SpO2 95%   Wt Readings from Last 3 Encounters:  10/10/18 175 lb 3.2 oz (79.5 kg)  05/07/18 174 lb (78.9 kg)  11/02/17 170 lb (77.1 kg)    Physical Exam Vitals signs and nursing note reviewed. Exam conducted with a chaperone present.  Constitutional:      General: She is not in acute distress.    Appearance: Normal appearance. She is not ill-appearing, toxic-appearing or diaphoretic.  HENT:     Head: Normocephalic and atraumatic.     Right Ear: Tympanic membrane, ear canal and external ear normal. There is no impacted cerumen.     Left Ear: Tympanic membrane, ear canal and external ear normal. There is no impacted cerumen.     Nose: Nose normal. No congestion or rhinorrhea.     Mouth/Throat:     Mouth: Mucous membranes are moist.     Pharynx: Oropharynx is clear. No oropharyngeal exudate or posterior  oropharyngeal erythema.  Eyes:     General: No scleral icterus.       Right eye: No discharge.        Left eye: No discharge.     Extraocular Movements: Extraocular movements intact.     Conjunctiva/sclera: Conjunctivae normal.     Pupils: Pupils are equal, round, and reactive to light.  Neck:     Musculoskeletal: Normal range of motion and neck supple. No neck rigidity or muscular tenderness.     Vascular: No carotid bruit.  Cardiovascular:     Rate and Rhythm:  Normal rate and regular rhythm.     Pulses: Normal pulses.     Heart sounds: No murmur. No friction rub. No gallop.   Pulmonary:     Effort: Pulmonary effort is normal. No respiratory distress.     Breath sounds: Normal breath sounds. No stridor. No wheezing, rhonchi or rales.  Chest:     Chest wall: No tenderness.     Breasts:        Right: Normal. No swelling, bleeding, inverted nipple, mass, nipple discharge, skin change or tenderness.        Left: Normal. No swelling, bleeding, inverted nipple, mass, nipple discharge, skin change or tenderness.  Abdominal:     General: Abdomen is flat. Bowel sounds are normal. There is no distension.     Palpations: Abdomen is soft. There is no mass.     Tenderness: There is no abdominal tenderness. There is no right CVA tenderness, left CVA tenderness, guarding or rebound.     Hernia: No hernia is present.  Genitourinary:    Comments: Pelvic exams deferred with shared decision making Musculoskeletal:        General: No swelling, tenderness, deformity or signs of injury.     Right lower leg: No edema.     Left lower leg: No edema.  Lymphadenopathy:     Cervical: No cervical adenopathy.  Skin:    General: Skin is warm and dry.     Capillary Refill: Capillary refill takes less than 2 seconds.     Coloration: Skin is not jaundiced or pale.     Findings: No bruising, erythema, lesion or rash.  Neurological:     General: No focal deficit present.     Mental Status: She is alert and  oriented to person, place, and time. Mental status is at baseline.     Cranial Nerves: No cranial nerve deficit.     Sensory: No sensory deficit.     Motor: No weakness.     Coordination: Coordination normal.     Gait: Gait normal.     Deep Tendon Reflexes: Reflexes normal.  Psychiatric:        Mood and Affect: Mood normal.        Behavior: Behavior normal.        Thought Content: Thought content normal.        Judgment: Judgment normal.     Results for orders placed or performed in visit on 0000000  Basic metabolic panel  Result Value Ref Range   Glucose 135 (H) 65 - 99 mg/dL   BUN 12 6 - 24 mg/dL   Creatinine, Ser 0.51 (L) 0.57 - 1.00 mg/dL   GFR calc non Af Amer 108 >59 mL/min/1.73   GFR calc Af Amer 124 >59 mL/min/1.73   BUN/Creatinine Ratio 24 (H) 9 - 23   Sodium 141 134 - 144 mmol/L   Potassium 3.9 3.5 - 5.2 mmol/L   Chloride 102 96 - 106 mmol/L   CO2 22 20 - 29 mmol/L   Calcium 9.5 8.7 - 10.2 mg/dL  LP+ALT+AST Piccolo, Waived  Result Value Ref Range   ALT (SGPT) Piccolo, Waived 26 10 - 47 U/L   AST (SGOT) Piccolo, Waived 33 11 - 38 U/L   Cholesterol Piccolo, Waived 186 <200 mg/dL   HDL Chol Piccolo, Waived 48 (L) >59 mg/dL   Triglycerides Piccolo,Waived 327 (H) <150 mg/dL   Chol/HDL Ratio Piccolo,Waive 3.9 mg/dL   LDL Chol Calc Piccolo Waived 73 <100 mg/dL   VLDL  Chol Calc Piccolo,Waive 65 (H) <30 mg/dL  Hepatitis C Antibody  Result Value Ref Range   Hep C Virus Ab <0.1 0.0 - 0.9 s/co ratio  HIV antibody (with reflex)  Result Value Ref Range   HIV Screen 4th Generation wRfx Non Reactive Non Reactive      Assessment & Plan:   Problem List Items Addressed This Visit      Cardiovascular and Mediastinum   Essential hypertension    Under good control on current regimen. Continue current regimen. Continue to monitor. Call with any concerns. Refills given. Labs drawn today.        Relevant Medications   amLODipine (NORVASC) 5 MG tablet   ezetimibe (ZETIA)  10 MG tablet   aspirin 81 MG chewable tablet   Other Relevant Orders   CBC with Differential/Platelet   Comprehensive metabolic panel   Microalbumin, Urine Waived   TSH   UA/M w/rflx Culture, Routine   Atherosclerosis of right carotid artery    Will keep BP and cholesterol under good control. Continue to monitor. Due for recheck- last in 2017- ordered today.      Relevant Medications   amLODipine (NORVASC) 5 MG tablet   ezetimibe (ZETIA) 10 MG tablet   aspirin 81 MG chewable tablet   Other Relevant Orders   US Carotid Duplex Bilateral   Carotid stenosis    Will keep BP and cholesterol under good control. Continue to monitor. Due for recheck- last in 2017- ordered today.      Relevant Medications   amLODipine (NORVASC) 5 MG tablet   ezetimibe (ZETIA) 10 MG tablet   aspirin 81 MG chewable tablet   Other Relevant Orders   US Carotid Duplex Bilateral     Endocrine   Ovarian cancer ([place])    Continue to follow with oncology. Call with any concerns. Continue to monitor.       Relevant Medications   aspirin 81 MG chewable tablet   Ovarian carcinoma, epithelial ([facility])    Continue to follow with oncology. Call with any concerns. Continue to monitor.       Relevant Medications   aspirin 81 MG chewable tablet     Nervous and Auditory   Peripheral neuropathy due to chemotherapy ([facility])    Stable. Continue to monitor. Call with any concerns.         Other   Hypercholesterolemia    Under good control on current regimen. Continue current regimen. Continue to monitor. Call with any concerns. Refills given. Labs drawn today.       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 Call with any concerns. Continue to monitor.         Other Visit Diagnoses    Routine general medical examination at a health care facility    -  Primary   Vaccines up to date. Screening labs up to date. Mammogram up to date. Colonoscopy up to date. Continue diet and exercise. Continue to monitor.    Relevant Orders   CBC with Differential/Platelet   Comprehensive metabolic panel   Lipid Panel w/o Chol/HDL Ratio   Microalbumin, Urine Waived   TSH   UA/M w/rflx Culture, Routine   Essential (primary) hypertension  Relevant Medications   amLODipine (NORVASC) 5 MG tablet   ezetimibe (ZETIA) 10 MG tablet   aspirin 81 MG chewable tablet       Follow up plan: Return in about 6 months (around 10/10/2019).   LABORATORY TESTING:  - Pap smear: not applicable  IMMUNIZATIONS:   - Tdap: Tetanus vaccination status reviewed: last tetanus booster within 10 years. - Influenza: Refused - Pneumovax: Up to date - Prevnar: Not applicable  SCREENING: -Mammogram: Up to date  - Colonoscopy: Up to date   PATIENT COUNSELING:   Advised to take 1 mg of folate supplement per day if capable of pregnancy.   Sexuality: Discussed sexually transmitted diseases, partner selection, use of condoms, avoidance of unintended pregnancy  and contraceptive alternatives.   Advised to avoid cigarette smoking.  I discussed with the patient that most people either abstain from alcohol or drink within safe limits (<=14/week and <=4 drinks/occasion for males, <=7/weeks and <= 3 drinks/occasion for females) and that the risk for alcohol disorders and other health effects rises proportionally with the number of drinks per week and how often a drinker exceeds daily limits.  Discussed cessation/primary prevention of drug use and availability of treatment for abuse.   Diet: Encouraged to adjust caloric intake to maintain  or achieve ideal body weight, to reduce intake of dietary saturated fat and total fat, to limit sodium intake by avoiding high sodium foods and not  adding table salt, and to maintain adequate dietary potassium and calcium preferably from fresh fruits, vegetables, and low-fat dairy products.    stressed the importance of regular exercise  Injury prevention: Discussed safety belts, safety helmets, smoke detector, smoking near bedding or upholstery.   Dental health: Discussed importance of regular tooth brushing, flossing, and dental visits.    NEXT PREVENTATIVE PHYSICAL DUE IN 1 YEAR. Return in about 6 months (around 10/10/2019).

## 2019-04-12 LAB — CBC WITH DIFFERENTIAL/PLATELET
Basophils Absolute: 0.1 10*3/uL (ref 0.0–0.2)
Basos: 1 %
EOS (ABSOLUTE): 0.1 10*3/uL (ref 0.0–0.4)
Eos: 1 %
Hematocrit: 34.7 % (ref 34.0–46.6)
Hemoglobin: 10.6 g/dL — ABNORMAL LOW (ref 11.1–15.9)
Immature Grans (Abs): 0 10*3/uL (ref 0.0–0.1)
Immature Granulocytes: 0 %
Lymphocytes Absolute: 1.5 10*3/uL (ref 0.7–3.1)
Lymphs: 15 %
MCH: 22.7 pg — ABNORMAL LOW (ref 26.6–33.0)
MCHC: 30.5 g/dL — ABNORMAL LOW (ref 31.5–35.7)
MCV: 75 fL — ABNORMAL LOW (ref 79–97)
Monocytes Absolute: 0.6 10*3/uL (ref 0.1–0.9)
Monocytes: 6 %
Neutrophils Absolute: 7.9 10*3/uL — ABNORMAL HIGH (ref 1.4–7.0)
Neutrophils: 77 %
Platelets: 531 10*3/uL — ABNORMAL HIGH (ref 150–450)
RBC: 4.66 x10E6/uL (ref 3.77–5.28)
RDW: 15.2 % (ref 11.7–15.4)
WBC: 10.2 10*3/uL (ref 3.4–10.8)

## 2019-04-12 LAB — COMPREHENSIVE METABOLIC PANEL
ALT: 23 IU/L (ref 0–32)
AST: 26 IU/L (ref 0–40)
Albumin/Globulin Ratio: 1.7 (ref 1.2–2.2)
Albumin: 4.6 g/dL (ref 3.8–4.9)
Alkaline Phosphatase: 127 IU/L — ABNORMAL HIGH (ref 39–117)
BUN/Creatinine Ratio: 21 (ref 9–23)
BUN: 12 mg/dL (ref 6–24)
Bilirubin Total: 0.2 mg/dL (ref 0.0–1.2)
CO2: 23 mmol/L (ref 20–29)
Calcium: 9.7 mg/dL (ref 8.7–10.2)
Chloride: 100 mmol/L (ref 96–106)
Creatinine, Ser: 0.56 mg/dL — ABNORMAL LOW (ref 0.57–1.00)
GFR calc Af Amer: 120 mL/min/{1.73_m2} (ref >59)
GFR calc non Af Amer: 104 mL/min/{1.73_m2} (ref >59)
Globulin, Total: 2.7 g/dL (ref 1.5–4.5)
Glucose: 87 mg/dL (ref 65–99)
Potassium: 4.3 mmol/L (ref 3.5–5.2)
Sodium: 138 mmol/L (ref 134–144)
Total Protein: 7.3 g/dL (ref 6.0–8.5)

## 2019-04-12 LAB — LIPID PANEL W/O CHOL/HDL RATIO
Cholesterol, Total: 178 mg/dL (ref 100–199)
HDL: 58 mg/dL (ref >39)
LDL Chol Calc (NIH): 104 mg/dL — ABNORMAL HIGH (ref 0–99)
Triglycerides: 88 mg/dL (ref 0–149)
VLDL Cholesterol Cal: 16 mg/dL (ref 5–40)

## 2019-04-12 LAB — TSH: TSH: 1.23 u[IU]/mL (ref 0.450–4.500)

## 2019-04-15 ENCOUNTER — Other Ambulatory Visit: Payer: Self-pay | Admitting: Family Medicine

## 2019-04-15 LAB — MICROALBUMIN, URINE WAIVED
Creatinine, Urine Waived: 200 mg/dL (ref 10–300)
Microalb, Ur Waived: 30 mg/L — ABNORMAL HIGH (ref 0–19)
Microalb/Creat Ratio: <30 mg/g (ref <30)

## 2019-04-15 LAB — URINE CULTURE, REFLEX

## 2019-04-15 LAB — UA/M W/RFLX CULTURE, ROUTINE
Bilirubin, UA: NEGATIVE
Glucose, UA: NEGATIVE
Nitrite, UA: NEGATIVE
Protein,UA: NEGATIVE
RBC, UA: NEGATIVE
Specific Gravity, UA: 1.025 (ref 1.005–1.030)
Urobilinogen, Ur: 0.2 mg/dL (ref 0.2–1.0)
pH, UA: 5 (ref 5.0–7.5)

## 2019-04-15 LAB — MICROSCOPIC EXAMINATION: RBC, Urine: NONE SEEN /hpf (ref 0–2)

## 2019-04-15 MED ORDER — NITROFURANTOIN MONOHYD MACRO 100 MG PO CAPS: 100.0000 mg | ORAL_CAPSULE | Freq: Two times a day (BID) | ORAL | 0 refills | Status: DC

## 2019-04-16 ENCOUNTER — Other Ambulatory Visit: Payer: Self-pay

## 2019-04-16 ENCOUNTER — Ambulatory Visit
Admission: RE | Admit: 2019-04-16 | Discharge: 2019-04-16 | Disposition: A | Discharge status: Home / Self Care | Payer: Commercial Managed Care - PPO | Source: Ambulatory Visit | Attending: Family Medicine | Admitting: Family Medicine

## 2019-04-16 DIAGNOSIS — I6521 Occlusion and stenosis of right carotid artery: Secondary | ICD-10-CM | POA: Insufficient documentation

## 2019-05-07 ENCOUNTER — Ambulatory Visit (INDEPENDENT_AMBULATORY_CARE_PROVIDER_SITE_OTHER): Payer: Commercial Managed Care - PPO

## 2019-05-07 ENCOUNTER — Other Ambulatory Visit: Payer: Self-pay

## 2019-05-07 DIAGNOSIS — Z23 Encounter for immunization: Secondary | ICD-10-CM

## 2019-10-06 ENCOUNTER — Encounter: Payer: Self-pay | Admitting: Family Medicine

## 2019-10-07 ENCOUNTER — Other Ambulatory Visit: Payer: Self-pay | Admitting: Family Medicine

## 2019-10-07 ENCOUNTER — Telehealth: Payer: Self-pay

## 2019-10-07 DIAGNOSIS — I10 Essential (primary) hypertension: Secondary | ICD-10-CM

## 2019-10-07 NOTE — Telephone Encounter (Signed)
Requested Prescriptions  Pending Prescriptions Disp Refills  . ezetimibe (ZETIA) 10 MG tablet [Pharmacy Med Name: EZETIMIBE 10MG  TABLETS] 90 tablet 1    Sig: TAKE 1 TABLET(10 MG) BY MOUTH DAILY     Cardiovascular:  Antilipid - Sterol Transport Inhibitors Failed - 10/07/2019  7:09 AM      Failed - LDL in normal range and within 360 days    LDL Chol Calc (NIH)  Date Value Ref Range Status  04/11/2019 104 (H) 0 - 99 mg/dL Final         Passed - Total Cholesterol in normal range and within 360 days    Cholesterol, Total  Date Value Ref Range Status  04/11/2019 178 100 - 199 mg/dL Final   Cholesterol Piccolo, Waived  Date Value Ref Range Status  05/07/2018 186 <200 mg/dL Final    Comment:                            Desirable                <200                         Borderline High      200- 239                         High                     >239          Passed - HDL in normal range and within 360 days    HDL  Date Value Ref Range Status  04/11/2019 58 >39 mg/dL Final         Passed - Triglycerides in normal range and within 360 days    Triglycerides  Date Value Ref Range Status  04/11/2019 88 0 - 149 mg/dL Final   Triglycerides Piccolo,Waived  Date Value Ref Range Status  05/07/2018 327 (H) <150 mg/dL Final    Comment:                            Normal                   <150                         Borderline High     150 - 199                         High                200 - 499                         Very High                >499          Passed - Valid encounter within last 12 months    Recent Outpatient Visits          5 months ago Routine general medical examination at a health care facility   [facility], Megan P, DO   12 months ago Essential hypertension   [place], Jeannette How, MD   1 year  ago Essential (primary) hypertension   [place], Jeannette How, MD   1 year ago Essential (primary)  hypertension   [facility] [place], Jeannette How, MD   2 years ago Influenza [place], Lilia Argue, [place]      Future Appointments            In 3 days Wynetta Emery, Barb Merino, DO [employer], [place]

## 2019-10-07 NOTE — Telephone Encounter (Signed)
Prior Authorization initiated via CoverMyMeds for [employer]: [employer]

## 2019-10-07 NOTE — Telephone Encounter (Signed)
Requested Prescriptions  Pending Prescriptions Disp Refills  . amLODipine (NORVASC) 5 MG tablet [Pharmacy Med Name: AMLODIPINE BESYLATE 5MG  TABLETS] 90 tablet 0    Sig: TAKE 1 TABLET(5 MG) BY MOUTH DAILY     Cardiovascular:  Calcium Channel Blockers Failed - 10/07/2019  3:20 AM      Failed - Last BP in normal range    BP Readings from Last 1 Encounters:  04/11/19 (!) 148/88         Passed - Valid encounter within last 6 months    Recent Outpatient Visits          5 months ago Routine general medical examination at a health care facility   [facility], Megan P, DO   12 months ago Essential hypertension   [place], Jeannette How, MD   1 year ago Essential (primary) hypertension   [facility] [place], Jeannette How, [place]   1 year ago Essential (primary) hypertension   [place], Jeannette How, MD   2 years ago Influenza [place], Lilia Argue, [place]      Future Appointments            In 3 days Wynetta Emery, Barb Merino, DO [employer], [place]

## 2019-10-09 ENCOUNTER — Encounter: Payer: Self-pay | Admitting: Family Medicine

## 2019-10-10 ENCOUNTER — Ambulatory Visit: Payer: Commercial Managed Care - PPO | Admitting: Family Medicine

## 2019-10-14 NOTE — Telephone Encounter (Signed)
PA Approved

## 2019-10-28 ENCOUNTER — Ambulatory Visit: Payer: Commercial Managed Care - PPO | Admitting: Family Medicine

## 2019-10-29 ENCOUNTER — Ambulatory Visit (INDEPENDENT_AMBULATORY_CARE_PROVIDER_SITE_OTHER): Payer: Commercial Managed Care - PPO | Admitting: Family Medicine

## 2019-10-29 ENCOUNTER — Encounter: Payer: Self-pay | Admitting: Family Medicine

## 2019-10-29 ENCOUNTER — Other Ambulatory Visit: Payer: Self-pay

## 2019-10-29 VITALS — BP 132/72 | HR 94 | Temp 98.3°F | Ht 65.75 in | Wt 166.5 lb

## 2019-10-29 DIAGNOSIS — E7800 Pure hypercholesterolemia, unspecified: Secondary | ICD-10-CM

## 2019-10-29 DIAGNOSIS — C569 Malignant neoplasm of unspecified ovary: Secondary | ICD-10-CM

## 2019-10-29 DIAGNOSIS — Z021 Encounter for pre-employment examination: Secondary | ICD-10-CM | POA: Diagnosis not present

## 2019-10-29 DIAGNOSIS — Z1231 Encounter for screening mammogram for malignant neoplasm of breast: Secondary | ICD-10-CM | POA: Diagnosis not present

## 2019-10-29 DIAGNOSIS — I10 Essential (primary) hypertension: Secondary | ICD-10-CM | POA: Diagnosis not present

## 2019-10-29 LAB — BAYER DCA HB A1C WAIVED: HB A1C (BAYER DCA - WAIVED): 5.5 % (ref <7.0)

## 2019-10-29 NOTE — Assessment & Plan Note (Signed)
Having another reoccurrence. Under treatment at [facility]. Will put in standing orders for her if needed. Call with any concerns.

## 2019-10-29 NOTE — Assessment & Plan Note (Addendum)
Rechecking levels today. Await results. Treat as needed. Call with any concerns.  

## 2019-10-29 NOTE — Assessment & Plan Note (Signed)
Having another reoccurrence. Under treatment at [facility]. Will put in standing orders for her if needed. Call with any concerns.

## 2019-10-29 NOTE — Progress Notes (Signed)
BP 132/72 (BP Location: Left Arm, Cuff Size: Normal)   Pulse 94   Temp 98.3 F (36.8 C) (Oral)   Ht 5' 5.75" (1.67 m)   Wt 166 lb 8 oz (75.5 kg)   SpO2 100%   BMI 27.08 kg/m    Subjective:    Patient ID: Diane Acosta, female    DOB: [birth year], 59 y.o.   MRN: [id number]  HPI: Diane Acosta is a 59 y.o. female  Chief Complaint  Patient presents with  . Hypertension    Patient says that ovarian cancer has returned and wanted to talk with Dr. Lenna Sciara about it  . Hyperlipidemia   Has a reocurrance of her cancer. Dealing with that. Lots of stress. Some discomfort. Is going to need weekly lab draws and is considering getting her labs drawn here vs going to [facility] due to ease of driving. She just found this out and is quite upset.   HYPERTENSION / HYPERLIPIDEMIA Satisfied with current treatment? yes Duration of hypertension: chronic BP monitoring frequency: not checking BP medication side effects: no Past BP meds: amlodipine Duration of hyperlipidemia: chronic Cholesterol medication side effects: no Cholesterol supplements: none Past cholesterol medications: zetia Medication compliance: excellent compliance Aspirin: yes Recent stressors: yes Recurrent headaches: no Visual changes: no Palpitations: no Dyspnea: no Chest pain: no Lower extremity edema: no Dizzy/lightheaded: no   Relevant past medical, surgical, family and social history reviewed and updated as indicated. Interim medical history since our last visit reviewed. Allergies and medications reviewed and updated.  Review of Systems  Constitutional: Negative.   Respiratory: Negative.   Cardiovascular: Negative.   Gastrointestinal: Negative.   Musculoskeletal: Negative.   Neurological: Negative.   Psychiatric/Behavioral: Negative.     Per HPI unless specifically indicated above     Objective:    BP 132/72 (BP Location: Left Arm, Cuff Size: Normal)   Pulse 94   Temp 98.3 F (36.8 C) (Oral)   Ht 5'  5.75" (1.67 m)   Wt 166 lb 8 oz (75.5 kg)   SpO2 100%   BMI 27.08 kg/m   Wt Readings from Last 3 Encounters:  10/29/19 166 lb 8 oz (75.5 kg)  10/10/18 175 lb 3.2 oz (79.5 kg)  05/07/18 174 lb (78.9 kg)    Physical Exam Vitals and nursing note reviewed.  Constitutional:      General: She is not in acute distress.    Appearance: Normal appearance. She is not ill-appearing, toxic-appearing or diaphoretic.  HENT:     Head: Normocephalic and atraumatic.     Right Ear: External ear normal.     Left Ear: External ear normal.     Nose: Nose normal.     Mouth/Throat:     Mouth: Mucous membranes are moist.     Pharynx: Oropharynx is clear.  Eyes:     General: No scleral icterus.       Right eye: No discharge.        Left eye: No discharge.     Extraocular Movements: Extraocular movements intact.     Conjunctiva/sclera: Conjunctivae normal.     Pupils: Pupils are equal, round, and reactive to light.  Cardiovascular:     Rate and Rhythm: Normal rate and regular rhythm.     Pulses: Normal pulses.     Heart sounds: Normal heart sounds. No murmur. No friction rub. No gallop.   Pulmonary:     Effort: Pulmonary effort is normal. No respiratory distress.     Breath  sounds: Normal breath sounds. No stridor. No wheezing, rhonchi or rales.  Chest:     Chest wall: No tenderness.  Musculoskeletal:        General: Normal range of motion.     Cervical back: Normal range of motion and neck supple.  Skin:    General: Skin is warm and dry.     Capillary Refill: Capillary refill takes less than 2 seconds.     Coloration: Skin is not jaundiced or pale.     Findings: No bruising, erythema, lesion or rash.  Neurological:     General: No focal deficit present.     Mental Status: She is alert and oriented to person, place, and time. Mental status is at baseline.  Psychiatric:        Mood and Affect: Mood normal.        Behavior: Behavior normal.        Thought Content: Thought content normal.          Judgment: Judgment normal.     Results for orders placed or performed in visit on 10/29/19  Comprehensive metabolic panel  Result Value Ref Range   Glucose 78 65 - 99 mg/dL   BUN 10 6 - 24 mg/dL   Creatinine, Ser 0.63 0.57 - 1.00 mg/dL   GFR calc non Af Amer 99 >59 mL/min/1.73   GFR calc Af Amer 114 >59 mL/min/1.73   BUN/Creatinine Ratio 16 9 - 23   Sodium 139 134 - 144 mmol/L   Potassium 4.3 3.5 - 5.2 mmol/L   Chloride 101 96 - 106 mmol/L   CO2 22 20 - 29 mmol/L   Calcium 9.4 8.7 - 10.2 mg/dL   Total Protein 7.0 6.0 - 8.5 g/dL   Albumin 4.2 3.8 - 4.9 g/dL   Globulin, Total 2.8 1.5 - 4.5 g/dL   Albumin/Globulin Ratio 1.5 1.2 - 2.2   Bilirubin Total 0.2 0.0 - 1.2 mg/dL   Alkaline Phosphatase 128 (H) 39 - 117 IU/L   AST 11 0 - 40 IU/L   ALT 15 0 - 32 IU/L  Lipid Panel w/o Chol/HDL Ratio  Result Value Ref Range   Cholesterol, Total 182 100 - 199 mg/dL   Triglycerides 96 0 - 149 mg/dL   HDL 57 >39 mg/dL   VLDL Cholesterol Cal 17 5 - 40 mg/dL   LDL Chol Calc (NIH) 108 (H) 0 - 99 mg/dL  Bayer DCA Hb A1c Waived  Result Value Ref Range   HB A1C (BAYER DCA - WAIVED) 5.5 <7.0 %      Assessment & Plan:   Problem List Items Addressed This Visit      Cardiovascular and Mediastinum   Essential hypertension - Primary    Elevated today. Recently found out she has a ovarian cancer reoccurrence. Better on recheck. Continue current regimen. Continue to monitor.       Relevant Orders   Comprehensive metabolic panel (Completed)     Endocrine   Ovarian cancer ([place])    Having another reoccurrence. Under treatment at [facility]. Will put in standing orders for her if needed. Call with any concerns.       Ovarian carcinoma, epithelial ([place])    Having another reoccurrence. Under treatment at [facility]. Will put in standing orders for her if needed. Call with any concerns.         Other   Hypercholesterolemia    Rechecking levels today. Await results. Treat as needed. Call with any  concerns.  Relevant Orders   Comprehensive metabolic panel (Completed)   Lipid Panel w/o Chol/HDL Ratio (Completed)    Other Visit Diagnoses    Encounter for pre-employment health screening examination       Labs drawn today. Await results. Call with any concerns.    Relevant Orders   Bayer [place] Hb A1c Waived (Completed)   Encounter for screening mammogram for malignant neoplasm of breast       Not at the top of her list. Order for mammogram placed today. Call with any concerns.    Relevant Orders   MM 3D SCREEN BREAST BILATERAL       Follow up plan: Return in about 4 weeks (around 11/26/2019).

## 2019-10-29 NOTE — Assessment & Plan Note (Addendum)
Elevated today. Recently found out she has a ovarian cancer reoccurrence. Better on recheck. Continue current regimen. Continue to monitor.

## 2019-10-30 ENCOUNTER — Telehealth: Payer: Self-pay | Admitting: Family Medicine

## 2019-10-30 ENCOUNTER — Telehealth: Payer: Self-pay

## 2019-10-30 LAB — COMPREHENSIVE METABOLIC PANEL
ALT: 15 IU/L (ref 0–32)
AST: 11 IU/L (ref 0–40)
Albumin/Globulin Ratio: 1.5 (ref 1.2–2.2)
Albumin: 4.2 g/dL (ref 3.8–4.9)
Alkaline Phosphatase: 128 IU/L — ABNORMAL HIGH (ref 39–117)
BUN/Creatinine Ratio: 16 (ref 9–23)
BUN: 10 mg/dL (ref 6–24)
Bilirubin Total: 0.2 mg/dL (ref 0.0–1.2)
CO2: 22 mmol/L (ref 20–29)
Calcium: 9.4 mg/dL (ref 8.7–10.2)
Chloride: 101 mmol/L (ref 96–106)
Creatinine, Ser: 0.63 mg/dL (ref 0.57–1.00)
GFR calc Af Amer: 114 mL/min/{1.73_m2} (ref >59)
GFR calc non Af Amer: 99 mL/min/{1.73_m2} (ref >59)
Globulin, Total: 2.8 g/dL (ref 1.5–4.5)
Glucose: 78 mg/dL (ref 65–99)
Potassium: 4.3 mmol/L (ref 3.5–5.2)
Sodium: 139 mmol/L (ref 134–144)
Total Protein: 7 g/dL (ref 6.0–8.5)

## 2019-10-30 LAB — LIPID PANEL W/O CHOL/HDL RATIO
Cholesterol, Total: 182 mg/dL (ref 100–199)
HDL: 57 mg/dL (ref >39)
LDL Chol Calc (NIH): 108 mg/dL — ABNORMAL HIGH (ref 0–99)
Triglycerides: 96 mg/dL (ref 0–149)
VLDL Cholesterol Cal: 17 mg/dL (ref 5–40)

## 2019-10-30 NOTE — Telephone Encounter (Signed)
-----   Message from Valerie Roys, DO sent at 10/29/2019  1:29 PM EDT ----- Needs order for mammogram through [facility]

## 2019-10-30 NOTE — Telephone Encounter (Signed)
Form for pt's mammogram filled out and faxed over to [place]

## 2019-10-30 NOTE — Telephone Encounter (Signed)
pt'S daughter (DRP) is here bringing form that needs to be signed,Left in folder to be signed.

## 2019-10-31 NOTE — Telephone Encounter (Signed)
Form filled out and placed in providers folder for signature.  

## 2019-11-01 NOTE — Telephone Encounter (Signed)
Form filled out and signed by provider. Called and let patient know that it was ready to be picked up.

## 2019-11-10 ENCOUNTER — Encounter: Payer: Self-pay | Admitting: Family Medicine

## 2019-11-11 ENCOUNTER — Encounter: Payer: Self-pay | Admitting: Family Medicine

## 2019-11-11 ENCOUNTER — Telehealth (INDEPENDENT_AMBULATORY_CARE_PROVIDER_SITE_OTHER): Payer: Commercial Managed Care - PPO | Admitting: Family Medicine

## 2019-11-11 DIAGNOSIS — C569 Malignant neoplasm of unspecified ovary: Secondary | ICD-10-CM

## 2019-11-11 NOTE — Progress Notes (Signed)
There were no vitals taken for this visit.   Subjective:    Patient ID: Diane Acosta, female    DOB: Jul 02, 1961, 59 y.o.   MRN: [id number]  HPI: Diane Acosta is a 59 y.o. female  Chief Complaint  Patient presents with  . Ovarian Cancer   Diane Acosta is going to restart her chemo on 5/20. She will need blood work done prior to her treatments, but is seen at [facility] and that is too far for her to drive. She would like to get her blood work done here, as she has with her previous treatments. She was talking with her oncologist, but there is a small window in which she can get her blood drawn and get it to her oncologist, otherwise they will cancel her treatments. Her oncologist wants her to have blood drawn on Tuesday and have her treatment on Thursday. However, Diane Acosta is not 100% sure what she wants drawn or how often. She is anxious about her diagnosis and starting treatment, but she is otherwise feeling OK. No other concerns or complaints at this time.   Relevant past medical, surgical, family and social history reviewed and updated as indicated. Interim medical history since our last visit reviewed. Allergies and medications reviewed and updated.  Review of Systems  Constitutional: Negative.   Respiratory: Negative.   Cardiovascular: Negative.   Gastrointestinal: Positive for abdominal distention and abdominal pain. Negative for anal bleeding, blood in stool, constipation, diarrhea, nausea, rectal pain and vomiting.  Musculoskeletal: Negative.   Psychiatric/Behavioral: Negative for agitation, behavioral problems, confusion, decreased concentration, dysphoric mood, hallucinations, self-injury, sleep disturbance and suicidal ideas. The patient is nervous/anxious. The patient is not hyperactive.     Per HPI unless specifically indicated above     Objective:    There were no vitals taken for this visit.  Wt Readings from Last 3 Encounters:  10/29/19 166 lb 8 oz (75.5 kg)  10/10/18  175 lb 3.2 oz (79.5 kg)  05/07/18 174 lb (78.9 kg)    Physical Exam Vitals and nursing note reviewed.  Constitutional:      General: She is not in acute distress.    Appearance: Normal appearance. She is not ill-appearing, toxic-appearing or diaphoretic.  HENT:     Head: Normocephalic and atraumatic.     Right Ear: External ear normal.     Left Ear: External ear normal.     Nose: Nose normal.     Mouth/Throat:     Mouth: Mucous membranes are moist.     Pharynx: Oropharynx is clear.  Eyes:     General: No scleral icterus.       Right eye: No discharge.        Left eye: No discharge.     Conjunctiva/sclera: Conjunctivae normal.     Pupils: Pupils are equal, round, and reactive to light.  Pulmonary:     Effort: Pulmonary effort is normal. No respiratory distress.     Comments: Speaking in full sentences Musculoskeletal:        General: Normal range of motion.     Cervical back: Normal range of motion.  Skin:    Coloration: Skin is not jaundiced or pale.     Findings: No bruising, erythema, lesion or rash.  Neurological:     Mental Status: She is alert and oriented to person, place, and time. Mental status is at baseline.  Psychiatric:        Mood and Affect: Mood normal.  Behavior: Behavior normal.        Thought Content: Thought content normal.        Judgment: Judgment normal.     Results for orders placed or performed in visit on 10/29/19  Comprehensive metabolic panel  Result Value Ref Range   Glucose 78 65 - 99 mg/dL   BUN 10 6 - 24 mg/dL   Creatinine, Ser 0.63 0.57 - 1.00 mg/dL   GFR calc non Af Amer 99 >59 mL/min/1.73   GFR calc Af Amer 114 >59 mL/min/1.73   BUN/Creatinine Ratio 16 9 - 23   Sodium 139 134 - 144 mmol/L   Potassium 4.3 3.5 - 5.2 mmol/L   Chloride 101 96 - 106 mmol/L   CO2 22 20 - 29 mmol/L   Calcium 9.4 8.7 - 10.2 mg/dL   Total Protein 7.0 6.0 - 8.5 g/dL   Albumin 4.2 3.8 - 4.9 g/dL   Globulin, Total 2.8 1.5 - 4.5 g/dL    Albumin/Globulin Ratio 1.5 1.2 - 2.2   Bilirubin Total 0.2 0.0 - 1.2 mg/dL   Alkaline Phosphatase 128 (H) 39 - 117 IU/L   AST 11 0 - 40 IU/L   ALT 15 0 - 32 IU/L  Lipid Panel w/o Chol/HDL Ratio  Result Value Ref Range   Cholesterol, Total 182 100 - 199 mg/dL   Triglycerides 96 0 - 149 mg/dL   HDL 57 >39 mg/dL   VLDL Cholesterol Cal 17 5 - 40 mg/dL   LDL Chol Calc (NIH) 108 (H) 0 - 99 mg/dL  Bayer DCA Hb A1c Waived  Result Value Ref Range   HB A1C (BAYER DCA - WAIVED) 5.5 <7.0 %      Assessment & Plan:   Problem List Items Addressed This Visit      Endocrine   Ovarian cancer ([place])    Restarting chemo on 5/20. Needs to have standing blood work. Will confirm what they need, how often and when they want to start and place standing orders for her. Call with any concerns.           Follow up plan: Return if symptoms worsen or fail to improve.   . This visit was completed via MyChart due to the restrictions of the COVID-19 pandemic. All issues as above were discussed and addressed. Physical exam was done as above through visual confirmation on MyChart. If it was felt that the patient should be evaluated in the office, they were directed there. The patient verbally consented to this visit. . Location of the patient: work . Location of the provider: work . Those involved with this call:  . Provider: Park Liter, DO . CMA: Lauretta Grill, RMA . Front Desk/Registration: Don Perking  . Time spent on call: 15 minutes with patient face to face via video conference. More than 50% of this time was spent in counseling and coordination of care. 23 minutes total spent in review of patient's record and preparation of their chart.

## 2019-11-11 NOTE — Assessment & Plan Note (Signed)
Restarting chemo on 5/20. Needs to have standing blood work. Will confirm what they need, how often and when they want to start and place standing orders for her. Call with any concerns.

## 2019-11-11 NOTE — Telephone Encounter (Signed)
Called pt scheduled virtual for 11:15 today

## 2019-11-15 ENCOUNTER — Telehealth: Payer: Self-pay

## 2019-11-15 NOTE — Telephone Encounter (Signed)
[place] called back requesting Fax number. Fax number provided.

## 2019-11-15 NOTE — Telephone Encounter (Signed)
-----   Message from Jerene Pitch, [place] sent at 11/15/2019 10:26 AM EDT ----- Alinda Sierras [id number] ----- Message ----- From: Jerene Pitch, CMA Sent: 11/15/2019  10:19 AM EDT To: [place] and left a detailed message for nurse, asking her to return my call. ----- Message ----- From: Jerene Pitch, CMA Sent: 11/12/2019   9:42 AM EDT To: [facility]  Called and left a detailed message for nurse, asking her to return my call.  ----- Message ----- From: Jerene Pitch, CMA Sent: 11/11/2019   2:42 PM EDT To: [facility]  Tried to call, no answer, will try again.  ----- Message ----- From: Valerie Roys, DO Sent: 11/11/2019  11:44 AM EDT To: [facility]  Bae-Jump, Tracie Harrier, MD  [address]  [id number] Physician Office Building  Obstetrics & Gynecology  [place], [place] [address]  [phone]  [phone] (Fax)   Nurse- Alinda Sierras  We need to find out the standing orders for [facility]. What does her oncologist want, and how often so I can put the orders in for her. She also told Iliya that it has to come through to her a specific way- can we find out if she wants a fax, the labs copied to her through [facility] or how she wants the blood work? Thanks!

## 2019-11-18 ENCOUNTER — Encounter: Payer: Self-pay | Admitting: Family Medicine

## 2019-11-26 ENCOUNTER — Other Ambulatory Visit: Payer: Commercial Managed Care - PPO

## 2019-11-26 ENCOUNTER — Other Ambulatory Visit: Payer: Self-pay

## 2019-11-26 ENCOUNTER — Telehealth: Payer: Self-pay

## 2019-11-26 DIAGNOSIS — C569 Malignant neoplasm of unspecified ovary: Secondary | ICD-10-CM

## 2019-11-26 NOTE — Telephone Encounter (Signed)
Please fax results when they come back tomorrow. Thanks!

## 2019-11-26 NOTE — Telephone Encounter (Signed)
Needed for at least six months  Fax to [phone]   Every other week  CMP Magnesium CBC w/ diff and plate   Between every other week   CBC w/ diff and plate  Dx: [id number]

## 2019-11-27 LAB — COMPREHENSIVE METABOLIC PANEL
ALT: 12 IU/L (ref 0–32)
AST: 15 IU/L (ref 0–40)
Albumin/Globulin Ratio: 1.4 (ref 1.2–2.2)
Albumin: 3.9 g/dL (ref 3.8–4.9)
Alkaline Phosphatase: 124 IU/L — ABNORMAL HIGH (ref 48–121)
BUN/Creatinine Ratio: 17 (ref 9–23)
BUN: 10 mg/dL (ref 6–24)
Bilirubin Total: <0.2 mg/dL (ref 0.0–1.2)
CO2: 21 mmol/L (ref 20–29)
Calcium: 9.2 mg/dL (ref 8.7–10.2)
Chloride: 104 mmol/L (ref 96–106)
Creatinine, Ser: 0.6 mg/dL (ref 0.57–1.00)
GFR calc Af Amer: 116 mL/min/{1.73_m2} (ref >59)
GFR calc non Af Amer: 101 mL/min/{1.73_m2} (ref >59)
Globulin, Total: 2.7 g/dL (ref 1.5–4.5)
Glucose: 94 mg/dL (ref 65–99)
Potassium: 4.2 mmol/L (ref 3.5–5.2)
Sodium: 139 mmol/L (ref 134–144)
Total Protein: 6.6 g/dL (ref 6.0–8.5)

## 2019-11-27 LAB — CBC WITH DIFFERENTIAL/PLATELET
Basophils Absolute: 0.1 10*3/uL (ref 0.0–0.2)
Basos: 1 %
EOS (ABSOLUTE): 0.3 10*3/uL (ref 0.0–0.4)
Eos: 4 %
Hematocrit: 27.5 % — ABNORMAL LOW (ref 34.0–46.6)
Hemoglobin: 7.7 g/dL — ABNORMAL LOW (ref 11.1–15.9)
Immature Grans (Abs): 0.1 10*3/uL (ref 0.0–0.1)
Immature Granulocytes: 1 %
Lymphocytes Absolute: 1.6 10*3/uL (ref 0.7–3.1)
Lymphs: 19 %
MCH: 17.2 pg — ABNORMAL LOW (ref 26.6–33.0)
MCHC: 28 g/dL — ABNORMAL LOW (ref 31.5–35.7)
MCV: 61 fL — ABNORMAL LOW (ref 79–97)
Monocytes Absolute: 0.8 10*3/uL (ref 0.1–0.9)
Monocytes: 9 %
Neutrophils Absolute: 5.7 10*3/uL (ref 1.4–7.0)
Neutrophils: 66 %
Platelets: 420 10*3/uL (ref 150–450)
RBC: 4.48 x10E6/uL (ref 3.77–5.28)
RDW: 18.6 % — ABNORMAL HIGH (ref 11.7–15.4)
WBC: 8.5 10*3/uL (ref 3.4–10.8)

## 2019-11-27 LAB — MAGNESIUM: Magnesium: 1.6 mg/dL (ref 1.6–2.3)

## 2019-11-27 LAB — PHOSPHORUS: Phosphorus: 2.7 mg/dL — ABNORMAL LOW (ref 3.0–4.3)

## 2019-11-27 NOTE — Telephone Encounter (Signed)
Labs faxed

## 2019-12-03 ENCOUNTER — Other Ambulatory Visit: Payer: Self-pay

## 2019-12-03 ENCOUNTER — Other Ambulatory Visit: Payer: Commercial Managed Care - PPO

## 2019-12-03 DIAGNOSIS — C569 Malignant neoplasm of unspecified ovary: Secondary | ICD-10-CM

## 2019-12-04 LAB — CBC WITH DIFFERENTIAL/PLATELET
Basophils Absolute: 0 10*3/uL (ref 0.0–0.2)
Basos: 0 %
EOS (ABSOLUTE): 0.1 10*3/uL (ref 0.0–0.4)
Eos: 2 %
Hematocrit: 35.5 % (ref 34.0–46.6)
Hemoglobin: 9.7 g/dL — ABNORMAL LOW (ref 11.1–15.9)
Immature Grans (Abs): 0 10*3/uL (ref 0.0–0.1)
Immature Granulocytes: 0 %
Lymphocytes Absolute: 1.1 10*3/uL (ref 0.7–3.1)
Lymphs: 21 %
MCH: 18 pg — ABNORMAL LOW (ref 26.6–33.0)
MCHC: 27.3 g/dL — ABNORMAL LOW (ref 31.5–35.7)
MCV: 66 fL — ABNORMAL LOW (ref 79–97)
Monocytes Absolute: 0.1 10*3/uL (ref 0.1–0.9)
Monocytes: 2 %
Neutrophils Absolute: 3.9 10*3/uL (ref 1.4–7.0)
Neutrophils: 75 %
Platelets: 503 10*3/uL — ABNORMAL HIGH (ref 150–450)
RBC: 5.4 x10E6/uL — ABNORMAL HIGH (ref 3.77–5.28)
RDW: 21.7 % — ABNORMAL HIGH (ref 11.7–15.4)
WBC: 5.3 10*3/uL (ref 3.4–10.8)

## 2019-12-10 ENCOUNTER — Other Ambulatory Visit: Payer: Commercial Managed Care - PPO

## 2019-12-10 ENCOUNTER — Other Ambulatory Visit: Payer: Self-pay

## 2019-12-10 DIAGNOSIS — C569 Malignant neoplasm of unspecified ovary: Secondary | ICD-10-CM

## 2019-12-11 LAB — CBC WITH DIFFERENTIAL/PLATELET
Basophils Absolute: 0 10*3/uL (ref 0.0–0.2)
Basos: 1 %
EOS (ABSOLUTE): 0.2 10*3/uL (ref 0.0–0.4)
Eos: 3 %
Hematocrit: 33.3 % — ABNORMAL LOW (ref 34.0–46.6)
Hemoglobin: 9.2 g/dL — ABNORMAL LOW (ref 11.1–15.9)
Immature Grans (Abs): 0 10*3/uL (ref 0.0–0.1)
Immature Granulocytes: 0 %
Lymphocytes Absolute: 1.5 10*3/uL (ref 0.7–3.1)
Lymphs: 25 %
MCH: 18.3 pg — ABNORMAL LOW (ref 26.6–33.0)
MCHC: 27.6 g/dL — ABNORMAL LOW (ref 31.5–35.7)
MCV: 66 fL — ABNORMAL LOW (ref 79–97)
Monocytes Absolute: 0.4 10*3/uL (ref 0.1–0.9)
Monocytes: 7 %
Neutrophils Absolute: 3.8 10*3/uL (ref 1.4–7.0)
Neutrophils: 64 %
Platelets: 236 10*3/uL (ref 150–450)
RBC: 5.04 x10E6/uL (ref 3.77–5.28)
RDW: 23.1 % — ABNORMAL HIGH (ref 11.7–15.4)
WBC: 5.8 10*3/uL (ref 3.4–10.8)

## 2019-12-11 LAB — COMPREHENSIVE METABOLIC PANEL
ALT: 16 IU/L (ref 0–32)
AST: 16 IU/L (ref 0–40)
Albumin/Globulin Ratio: 1.6 (ref 1.2–2.2)
Albumin: 4.1 g/dL (ref 3.8–4.9)
Alkaline Phosphatase: 133 IU/L — ABNORMAL HIGH (ref 48–121)
BUN/Creatinine Ratio: 16 (ref 9–23)
BUN: 9 mg/dL (ref 6–24)
Bilirubin Total: <0.2 mg/dL (ref 0.0–1.2)
CO2: 22 mmol/L (ref 20–29)
Calcium: 9.2 mg/dL (ref 8.7–10.2)
Chloride: 104 mmol/L (ref 96–106)
Creatinine, Ser: 0.57 mg/dL (ref 0.57–1.00)
GFR calc Af Amer: 118 mL/min/{1.73_m2} (ref >59)
GFR calc non Af Amer: 103 mL/min/{1.73_m2} (ref >59)
Globulin, Total: 2.5 g/dL (ref 1.5–4.5)
Glucose: 122 mg/dL — ABNORMAL HIGH (ref 65–99)
Potassium: 4.4 mmol/L (ref 3.5–5.2)
Sodium: 142 mmol/L (ref 134–144)
Total Protein: 6.6 g/dL (ref 6.0–8.5)

## 2019-12-11 LAB — MAGNESIUM: Magnesium: 1.7 mg/dL (ref 1.6–2.3)

## 2019-12-11 LAB — PHOSPHORUS: Phosphorus: 3.5 mg/dL (ref 3.0–4.3)

## 2019-12-17 ENCOUNTER — Other Ambulatory Visit: Payer: Commercial Managed Care - PPO

## 2019-12-17 ENCOUNTER — Other Ambulatory Visit: Payer: Self-pay

## 2019-12-17 DIAGNOSIS — C569 Malignant neoplasm of unspecified ovary: Secondary | ICD-10-CM

## 2019-12-18 ENCOUNTER — Encounter: Payer: Self-pay | Admitting: Family Medicine

## 2019-12-18 LAB — CBC WITH DIFFERENTIAL/PLATELET
Basophils Absolute: 0 10*3/uL (ref 0.0–0.2)
Basos: 1 %
EOS (ABSOLUTE): 0.1 10*3/uL (ref 0.0–0.4)
Eos: 2 %
Hematocrit: 33.4 % — ABNORMAL LOW (ref 34.0–46.6)
Hemoglobin: 9.6 g/dL — ABNORMAL LOW (ref 11.1–15.9)
Immature Grans (Abs): 0 10*3/uL (ref 0.0–0.1)
Immature Granulocytes: 0 %
Lymphocytes Absolute: 1.3 10*3/uL (ref 0.7–3.1)
Lymphs: 39 %
MCH: 18.9 pg — ABNORMAL LOW (ref 26.6–33.0)
MCHC: 28.7 g/dL — ABNORMAL LOW (ref 31.5–35.7)
MCV: 66 fL — ABNORMAL LOW (ref 79–97)
Monocytes Absolute: 0.1 10*3/uL (ref 0.1–0.9)
Monocytes: 3 %
Neutrophils Absolute: 1.8 10*3/uL (ref 1.4–7.0)
Neutrophils: 55 %
Platelets: 674 10*3/uL — ABNORMAL HIGH (ref 150–450)
RBC: 5.08 x10E6/uL (ref 3.77–5.28)
RDW: 23.8 % — ABNORMAL HIGH (ref 11.7–15.4)
WBC: 3.2 10*3/uL — ABNORMAL LOW (ref 3.4–10.8)

## 2019-12-24 ENCOUNTER — Other Ambulatory Visit: Payer: Commercial Managed Care - PPO

## 2019-12-28 ENCOUNTER — Encounter: Payer: Self-pay | Admitting: Family Medicine

## 2019-12-30 ENCOUNTER — Encounter: Payer: Self-pay | Admitting: Family Medicine

## 2019-12-30 ENCOUNTER — Telehealth (INDEPENDENT_AMBULATORY_CARE_PROVIDER_SITE_OTHER): Payer: Commercial Managed Care - PPO | Admitting: Family Medicine

## 2019-12-30 DIAGNOSIS — G43709 Chronic migraine without aura, not intractable, without status migrainosus: Secondary | ICD-10-CM | POA: Diagnosis not present

## 2019-12-30 MED ORDER — SUMATRIPTAN 20 MG/ACT NA SOLN
20.0000 mg | NASAL | 12 refills | Status: DC | PRN
Start: 2019-12-30 — End: 2020-12-30

## 2019-12-30 NOTE — Progress Notes (Signed)
There were no vitals taken for this visit.   Subjective:    Patient ID: Diane Acosta, female    DOB: 03-18-1961, 59 y.o.   MRN: [id number]  HPI: Diane Acosta is a 59 y.o. female  Chief Complaint  Patient presents with  . Headache   Is currently undergoing chemo for her recurrence of her ovarian cancer. She did fine after the first round. With her 2nd round of treatment she had severe migraines for about 6 days after. She was able to keep it at [facility] with excedrin and advil, but it's not getting better and she is feeling pretty bad. She has been having chemo side effects as well as migraines. She notes that her oncologist wasn't sure what to do with it and advised her to follow up here.   MIGRAINES Duration: chronic, worse in the last week Onset: sudden Severity: severe Quality: sharp and severe Frequency: intermittent Location: behind R eye and on the top of her head Headache duration: hours to days Radiation: no Time of day headache occurs: at random Headache status at time of visit: asymptomatic Treatments attempted:  aleve, excedrin   Aura: no Nausea:  yes Vomiting: yes Photophobia:  yes Phonophobia:  yes Effect on social functioning:  yes Confusion:  no Gait disturbance/ataxia:  no Behavioral changes:  no Fevers:  no  Relevant past medical, surgical, family and social history reviewed and updated as indicated. Interim medical history since our last visit reviewed. Allergies and medications reviewed and updated.  Review of Systems  Constitutional: Positive for fatigue. Negative for activity change, appetite change, chills, diaphoresis, fever and unexpected weight change.  HENT: Negative.   Respiratory: Negative.   Cardiovascular: Negative.   Musculoskeletal: Positive for arthralgias and myalgias. Negative for back pain, gait problem, joint swelling, neck pain and neck stiffness.  Skin: Negative.   Neurological: Positive for headaches. Negative for  dizziness, tremors, seizures, syncope, facial asymmetry, speech difficulty, weakness, light-headedness and numbness.  Psychiatric/Behavioral: Positive for agitation. Negative for behavioral problems, confusion, decreased concentration, dysphoric mood, hallucinations, self-injury, sleep disturbance and suicidal ideas. The patient is not nervous/anxious and is not hyperactive.     Per HPI unless specifically indicated above     Objective:    There were no vitals taken for this visit.  Wt Readings from Last 3 Encounters:  10/29/19 166 lb 8 oz (75.5 kg)  10/10/18 175 lb 3.2 oz (79.5 kg)  05/07/18 174 lb (78.9 kg)    Physical Exam Vitals and nursing note reviewed.  Constitutional:      General: She is not in acute distress.    Appearance: Normal appearance. She is not ill-appearing, toxic-appearing or diaphoretic.  HENT:     Head: Normocephalic and atraumatic.     Right Ear: External ear normal.     Left Ear: External ear normal.     Nose: Nose normal.     Mouth/Throat:     Mouth: Mucous membranes are moist.     Pharynx: Oropharynx is clear.  Eyes:     General: No scleral icterus.       Right eye: No discharge.        Left eye: No discharge.     Conjunctiva/sclera: Conjunctivae normal.     Pupils: Pupils are equal, round, and reactive to light.  Pulmonary:     Effort: Pulmonary effort is normal. No respiratory distress.     Comments: Speaking in full sentences Musculoskeletal:        General:  Normal range of motion.     Cervical back: Normal range of motion.  Skin:    Coloration: Skin is not jaundiced or pale.     Findings: No bruising, erythema, lesion or rash.  Neurological:     Mental Status: She is alert and oriented to person, place, and time. Mental status is at baseline.  Psychiatric:        Mood and Affect: Mood normal.        Behavior: Behavior normal.        Thought Content: Thought content normal.        Judgment: Judgment normal.     Results for orders  placed or performed in visit on 12/17/19  CBC with Differential/Platelet  Result Value Ref Range   WBC 3.2 (L) 3.4 - 10.8 x10E3/uL   RBC 5.08 3.77 - 5.28 x10E6/uL   Hemoglobin 9.6 (L) 11.1 - 15.9 g/dL   Hematocrit 33.4 (L) 34.0 - 46.6 %   MCV 66 (L) 79 - 97 fL   MCH 18.9 (L) 26.6 - 33.0 pg   MCHC 28.7 (L) 31 - 35 g/dL   RDW 23.8 (H) 11.7 - 15.4 %   Platelets 674 (H) 150 - 450 x10E3/uL   Neutrophils 55 Not Estab. %   Lymphs 39 Not Estab. %   Monocytes 3 Not Estab. %   Eos 2 Not Estab. %   Basos 1 Not Estab. %   Neutrophils Absolute 1.8 1 - 7 x10E3/uL   Lymphocytes Absolute 1.3 0 - 3 x10E3/uL   Monocytes Absolute 0.1 0 - 0 x10E3/uL   EOS (ABSOLUTE) 0.1 0.0 - 0.4 x10E3/uL   Basophils Absolute 0.0 0 - 0 x10E3/uL   Immature Granulocytes 0 Not Estab. %   Immature Grans (Abs) 0.0 0.0 - 0.1 x10E3/uL   Hematology Comments: Note:       Assessment & Plan:   Problem List Items Addressed This Visit    None    Visit Diagnoses    Chronic migraine without aura without status migrainosus, not intractable    -  Primary   Will restart her nasal imitrex which is what worked 10 years ago. If having them more frequently will need to consider prophylaxis. Call with any concerns.    Relevant Medications   SUMAtriptan (IMITREX) 20 MG/ACT nasal spray       Follow up plan: Return if symptoms worsen or fail to improve.    . This visit was completed via MyChart due to the restrictions of the COVID-19 pandemic. All issues as above were discussed and addressed. Physical exam was done as above through visual confirmation on MyChart. If it was felt that the patient should be evaluated in the office, they were directed there. The patient verbally consented to this visit. . Location of the patient: home . Location of the provider: work . Those involved with this call:  . Provider: Park Liter, DO . CMA: Lauretta Grill, RMA . Front Desk/Registration: Don Perking  . Time spent on call: 15  minutes with patient face to face via video conference. More than 50% of this time was spent in counseling and coordination of care. 23 minutes total spent in review of patient's record and preparation of their chart.

## 2019-12-31 ENCOUNTER — Other Ambulatory Visit: Payer: Commercial Managed Care - PPO

## 2019-12-31 ENCOUNTER — Other Ambulatory Visit: Payer: Self-pay

## 2019-12-31 DIAGNOSIS — C569 Malignant neoplasm of unspecified ovary: Secondary | ICD-10-CM

## 2020-01-01 LAB — CBC WITH DIFFERENTIAL/PLATELET
Basophils Absolute: 0 10*3/uL (ref 0.0–0.2)
Basos: 0 %
EOS (ABSOLUTE): 0 10*3/uL (ref 0.0–0.4)
Eos: 0 %
Hematocrit: 32.4 % — ABNORMAL LOW (ref 34.0–46.6)
Hemoglobin: 9.5 g/dL — ABNORMAL LOW (ref 11.1–15.9)
Immature Grans (Abs): 0 10*3/uL (ref 0.0–0.1)
Immature Granulocytes: 1 %
Lymphocytes Absolute: 2.5 10*3/uL (ref 0.7–3.1)
Lymphs: 45 %
MCH: 19.5 pg — ABNORMAL LOW (ref 26.6–33.0)
MCHC: 29.3 g/dL — ABNORMAL LOW (ref 31.5–35.7)
MCV: 67 fL — ABNORMAL LOW (ref 79–97)
Monocytes Absolute: 0.2 10*3/uL (ref 0.1–0.9)
Monocytes: 3 %
Neutrophils Absolute: 2.9 10*3/uL (ref 1.4–7.0)
Neutrophils: 51 %
Platelets: 616 10*3/uL — ABNORMAL HIGH (ref 150–450)
RBC: 4.87 x10E6/uL (ref 3.77–5.28)
RDW: 25.9 % — ABNORMAL HIGH (ref 11.7–15.4)
WBC: 5.6 10*3/uL (ref 3.4–10.8)

## 2020-01-04 ENCOUNTER — Other Ambulatory Visit: Payer: Self-pay | Admitting: Family Medicine

## 2020-01-04 DIAGNOSIS — I10 Essential (primary) hypertension: Secondary | ICD-10-CM

## 2020-01-07 ENCOUNTER — Other Ambulatory Visit: Payer: Self-pay

## 2020-01-07 ENCOUNTER — Other Ambulatory Visit: Payer: Commercial Managed Care - PPO

## 2020-01-07 DIAGNOSIS — C569 Malignant neoplasm of unspecified ovary: Secondary | ICD-10-CM

## 2020-01-08 LAB — COMPREHENSIVE METABOLIC PANEL
ALT: 17 IU/L (ref 0–32)
AST: 14 IU/L (ref 0–40)
Albumin/Globulin Ratio: 1.6 (ref 1.2–2.2)
Albumin: 3.9 g/dL (ref 3.8–4.9)
Alkaline Phosphatase: 118 IU/L (ref 48–121)
BUN/Creatinine Ratio: 17 (ref 9–23)
BUN: 9 mg/dL (ref 6–24)
Bilirubin Total: <0.2 mg/dL (ref 0.0–1.2)
CO2: 24 mmol/L (ref 20–29)
Calcium: 8.8 mg/dL (ref 8.7–10.2)
Chloride: 103 mmol/L (ref 96–106)
Creatinine, Ser: 0.52 mg/dL — ABNORMAL LOW (ref 0.57–1.00)
GFR calc Af Amer: 122 mL/min/{1.73_m2} (ref >59)
GFR calc non Af Amer: 106 mL/min/{1.73_m2} (ref >59)
Globulin, Total: 2.5 g/dL (ref 1.5–4.5)
Glucose: 90 mg/dL (ref 65–99)
Potassium: 4.4 mmol/L (ref 3.5–5.2)
Sodium: 140 mmol/L (ref 134–144)
Total Protein: 6.4 g/dL (ref 6.0–8.5)

## 2020-01-08 LAB — PHOSPHORUS: Phosphorus: 3.4 mg/dL (ref 3.0–4.3)

## 2020-01-08 LAB — CBC WITH DIFFERENTIAL/PLATELET
Basophils Absolute: 0 10*3/uL (ref 0.0–0.2)
Basos: 0 %
EOS (ABSOLUTE): 0.1 10*3/uL (ref 0.0–0.4)
Eos: 1 %
Hematocrit: 31.8 % — ABNORMAL LOW (ref 34.0–46.6)
Hemoglobin: 8.9 g/dL — ABNORMAL LOW (ref 11.1–15.9)
Immature Grans (Abs): 0 10*3/uL (ref 0.0–0.1)
Immature Granulocytes: 0 %
Lymphocytes Absolute: 1.4 10*3/uL (ref 0.7–3.1)
Lymphs: 21 %
MCH: 20 pg — ABNORMAL LOW (ref 26.6–33.0)
MCHC: 28 g/dL — ABNORMAL LOW (ref 31.5–35.7)
MCV: 71 fL — ABNORMAL LOW (ref 79–97)
Monocytes Absolute: 0.7 10*3/uL (ref 0.1–0.9)
Monocytes: 11 %
Neutrophils Absolute: 4.3 10*3/uL (ref 1.4–7.0)
Neutrophils: 67 %
Platelets: 175 10*3/uL (ref 150–450)
RBC: 4.46 x10E6/uL (ref 3.77–5.28)
RDW: 26.5 % — ABNORMAL HIGH (ref 11.7–15.4)
WBC: 6.5 10*3/uL (ref 3.4–10.8)

## 2020-01-08 LAB — MAGNESIUM: Magnesium: 1.6 mg/dL (ref 1.6–2.3)

## 2020-01-14 ENCOUNTER — Other Ambulatory Visit: Payer: Self-pay

## 2020-01-14 ENCOUNTER — Other Ambulatory Visit: Payer: Commercial Managed Care - PPO

## 2020-01-14 DIAGNOSIS — C569 Malignant neoplasm of unspecified ovary: Secondary | ICD-10-CM

## 2020-01-15 ENCOUNTER — Telehealth: Payer: Self-pay | Admitting: Family Medicine

## 2020-01-15 ENCOUNTER — Encounter: Payer: Self-pay | Admitting: Family Medicine

## 2020-01-15 LAB — CBC WITH DIFFERENTIAL/PLATELET
Basophils Absolute: 0 10*3/uL (ref 0.0–0.2)
Basos: 0 %
EOS (ABSOLUTE): 0 10*3/uL (ref 0.0–0.4)
Eos: 0 %
Hematocrit: 34.3 % (ref 34.0–46.6)
Hemoglobin: 10.2 g/dL — ABNORMAL LOW (ref 11.1–15.9)
Immature Grans (Abs): 0 10*3/uL (ref 0.0–0.1)
Immature Granulocytes: 1 %
Lymphocytes Absolute: 2.6 10*3/uL (ref 0.7–3.1)
Lymphs: 56 %
MCH: 20.6 pg — ABNORMAL LOW (ref 26.6–33.0)
MCHC: 29.7 g/dL — ABNORMAL LOW (ref 31.5–35.7)
MCV: 69 fL — ABNORMAL LOW (ref 79–97)
Monocytes Absolute: 0.1 10*3/uL (ref 0.1–0.9)
Monocytes: 2 %
Neutrophils Absolute: 1.9 10*3/uL (ref 1.4–7.0)
Neutrophils: 41 %
Platelets: 318 10*3/uL (ref 150–450)
RBC: 4.96 x10E6/uL (ref 3.77–5.28)
RDW: 27.6 % — ABNORMAL HIGH (ref 11.7–15.4)
WBC: 4.6 10*3/uL (ref 3.4–10.8)

## 2020-01-15 NOTE — Telephone Encounter (Signed)
Results faxed.

## 2020-01-15 NOTE — Telephone Encounter (Signed)
Copied from [place] [id number]. Topic: General - Other >> Jan 15, 2020  9:31 AM Alanda Slim E wrote: Reason for CRM: Pt called about her labs from yesterday and would like a call to go over them / please advise

## 2020-01-15 NOTE — Telephone Encounter (Signed)
OK 

## 2020-01-15 NOTE — Telephone Encounter (Signed)
Routing to provider as labs have not been reviewed.

## 2020-01-15 NOTE — Telephone Encounter (Signed)
Please let her know that her CBC came back much improved and at/above what looks to be her recent baseline.   Will leave in Dr. Durenda Age box in case there was something specific she was looking into here, did not see any detailed notes regarding these recent labs but assuming this was rechecked because of her low hg reading the time before and she is undergoing chemotherapy

## 2020-01-15 NOTE — Telephone Encounter (Signed)
Okay to send results to [facility]?

## 2020-01-19 ENCOUNTER — Encounter: Payer: Self-pay | Admitting: Family Medicine

## 2020-01-20 ENCOUNTER — Encounter: Payer: Self-pay | Admitting: Family Medicine

## 2020-01-20 ENCOUNTER — Telehealth (INDEPENDENT_AMBULATORY_CARE_PROVIDER_SITE_OTHER): Payer: Commercial Managed Care - PPO | Admitting: Family Medicine

## 2020-01-20 VITALS — Wt 167.0 lb

## 2020-01-20 DIAGNOSIS — I952 Hypotension due to drugs: Secondary | ICD-10-CM

## 2020-01-20 NOTE — Progress Notes (Signed)
Wt 167 lb (75.8 kg)   BMI 27.16 kg/m    Subjective:    Patient ID: Diane Acosta, female    DOB: 1961-06-07, 59 y.o.   MRN: [id number]  HPI: Diane Acosta is a 59 y.o. female  Chief Complaint  Patient presents with  . Hypertension   Has been getting dizzy and almost passing out after chemo. She notes that her BP has been dropping. She has stopped her zetia and her amlodipine and her aspirin to try to get it to stop the BP dropping. She notes that since this weekend, she has been feeling sick in waves. She notes that her blood pressure is dropping when she is going from sitting to standing. She's dropping 86-57QION on her systolic. Happens entirely at random. Appetite has not been doing well. When she is having the drops, she is feeling nauseous.  HYPOTENSION Hypertension status: over treated  Satisfied with current treatment? no Duration of hypertension: chronic BP monitoring frequency:  a few times a day BP medication side effects:  no Aspirin: yes Recurrent headaches: no Visual changes: no Palpitations: no Dyspnea: no Chest pain: no Lower extremity edema: no Dizzy/lightheaded: yes  Relevant past medical, surgical, family and social history reviewed and updated as indicated. Interim medical history since our last visit reviewed. Allergies and medications reviewed and updated.  Review of Systems  Constitutional: Positive for fatigue. Negative for activity change, appetite change, chills, diaphoresis, fever and unexpected weight change.  HENT: Negative.   Respiratory: Negative.   Gastrointestinal: Negative.   Musculoskeletal: Negative.   Neurological: Positive for dizziness and light-headedness. Negative for tremors, seizures, syncope, facial asymmetry, speech difficulty, weakness, numbness and headaches.  Hematological: Negative.   Psychiatric/Behavioral: Negative.     Per HPI unless specifically indicated above     Objective:    Wt 167 lb (75.8 kg)    BMI 27.16 kg/m   Wt Readings from Last 3 Encounters:  01/20/20 167 lb (75.8 kg)  10/29/19 166 lb 8 oz (75.5 kg)  10/10/18 175 lb 3.2 oz (79.5 kg)    Physical Exam Vitals and nursing note reviewed.  Constitutional:      General: She is not in acute distress.    Appearance: Normal appearance. She is not ill-appearing, toxic-appearing or diaphoretic.  HENT:     Head: Normocephalic and atraumatic.     Right Ear: External ear normal.     Left Ear: External ear normal.     Nose: Nose normal.     Mouth/Throat:     Mouth: Mucous membranes are moist.     Pharynx: Oropharynx is clear.  Eyes:     General: No scleral icterus.       Right eye: No discharge.        Left eye: No discharge.     Conjunctiva/sclera: Conjunctivae normal.     Pupils: Pupils are equal, round, and reactive to light.  Pulmonary:     Effort: Pulmonary effort is normal. No respiratory distress.     Comments: Speaking in full sentences Musculoskeletal:        General: Normal range of motion.     Cervical back: Normal range of motion.  Skin:    Coloration: Skin is not jaundiced or pale.     Findings: No bruising, erythema, lesion or rash.  Neurological:     Mental Status: She is alert and oriented to person, place, and time. Mental status is at baseline.  Psychiatric:  Mood and Affect: Mood normal.        Behavior: Behavior normal.        Thought Content: Thought content normal.        Judgment: Judgment normal.     Results for orders placed or performed in visit on 01/14/20  CBC with Differential/Platelet  Result Value Ref Range   WBC 4.6 3.4 - 10.8 x10E3/uL   RBC 4.96 3.77 - 5.28 x10E6/uL   Hemoglobin 10.2 (L) 11.1 - 15.9 g/dL   Hematocrit 34.3 34.0 - 46.6 %   MCV 69 (L) 79 - 97 fL   MCH 20.6 (L) 26.6 - 33.0 pg   MCHC 29.7 (L) 31 - 35 g/dL   RDW 27.6 (H) 11.7 - 15.4 %   Platelets 318 150 - 450 x10E3/uL   Neutrophils 41 Not Estab. %   Lymphs 56 Not Estab. %   Monocytes 2 Not Estab. %   Eos 0  Not Estab. %   Basos 0 Not Estab. %   Neutrophils Absolute 1.9 1 - 7 x10E3/uL   Lymphocytes Absolute 2.6 0 - 3 x10E3/uL   Monocytes Absolute 0.1 0 - 0 x10E3/uL   EOS (ABSOLUTE) 0.0 0.0 - 0.4 x10E3/uL   Basophils Absolute 0.0 0 - 0 x10E3/uL   Immature Granulocytes 1 Not Estab. %   Immature Grans (Abs) 0.0 0.0 - 0.1 x10E3/uL   Hematology Comments: Note:       Assessment & Plan:   Problem List Items Addressed This Visit    None    Visit Diagnoses    Hypotension due to drugs    -  Primary   Agree with stopping her amlodipine. Increase fluids (including gatorade) Labs tomorrow, if not better by Wed- will evaluate in person.       Follow up plan: Return if symptoms worsen or fail to improve.   . This visit was completed via MyChart due to the restrictions of the COVID-19 pandemic. All issues as above were discussed and addressed. Physical exam was done as above through visual confirmation on MyChart. If it was felt that the patient should be evaluated in the office, they were directed there. The patient verbally consented to this visit. . Location of the patient: home . Location of the provider: work . Those involved with this call:  . Provider: Park Liter, DO . CMA: Lesle Chris, [place] . Front Desk/Registration: Don Perking  . Time spent on call: 15 minutes with patient face to face via video conference. More than 50% of this time was spent in counseling and coordination of care. 23 minutes total spent in review of patient's record and preparation of their chart.

## 2020-01-20 NOTE — Patient Instructions (Signed)

## 2020-01-21 ENCOUNTER — Other Ambulatory Visit: Payer: Self-pay

## 2020-01-21 ENCOUNTER — Other Ambulatory Visit: Payer: Commercial Managed Care - PPO

## 2020-01-21 DIAGNOSIS — C569 Malignant neoplasm of unspecified ovary: Secondary | ICD-10-CM

## 2020-01-22 LAB — CBC WITH DIFFERENTIAL/PLATELET
Basophils Absolute: 0 10*3/uL (ref 0.0–0.2)
Basos: 1 %
EOS (ABSOLUTE): 0.1 10*3/uL (ref 0.0–0.4)
Eos: 1 %
Hematocrit: 31.3 % — ABNORMAL LOW (ref 34.0–46.6)
Hemoglobin: 9.1 g/dL — ABNORMAL LOW (ref 11.1–15.9)
Immature Grans (Abs): 0 10*3/uL (ref 0.0–0.1)
Immature Granulocytes: 0 %
Lymphocytes Absolute: 1.4 10*3/uL (ref 0.7–3.1)
Lymphs: 22 %
MCH: 20.4 pg — ABNORMAL LOW (ref 26.6–33.0)
MCHC: 29.1 g/dL — ABNORMAL LOW (ref 31.5–35.7)
MCV: 70 fL — ABNORMAL LOW (ref 79–97)
Monocytes Absolute: 0.5 10*3/uL (ref 0.1–0.9)
Monocytes: 8 %
Neutrophils Absolute: 4.3 10*3/uL (ref 1.4–7.0)
Neutrophils: 68 %
Platelets: 192 10*3/uL (ref 150–450)
RBC: 4.45 x10E6/uL (ref 3.77–5.28)
RDW: 28.3 % — ABNORMAL HIGH (ref 11.7–15.4)
WBC: 6.3 10*3/uL (ref 3.4–10.8)

## 2020-01-22 LAB — COMPREHENSIVE METABOLIC PANEL
ALT: 21 IU/L (ref 0–32)
AST: 16 IU/L (ref 0–40)
Albumin/Globulin Ratio: 1.6 (ref 1.2–2.2)
Albumin: 4 g/dL (ref 3.8–4.9)
Alkaline Phosphatase: 120 IU/L (ref 48–121)
BUN/Creatinine Ratio: 15 (ref 9–23)
BUN: 8 mg/dL (ref 6–24)
Bilirubin Total: <0.2 mg/dL (ref 0.0–1.2)
CO2: 24 mmol/L (ref 20–29)
Calcium: 9.2 mg/dL (ref 8.7–10.2)
Chloride: 103 mmol/L (ref 96–106)
Creatinine, Ser: 0.53 mg/dL — ABNORMAL LOW (ref 0.57–1.00)
GFR calc Af Amer: 121 mL/min/{1.73_m2} (ref >59)
GFR calc non Af Amer: 105 mL/min/{1.73_m2} (ref >59)
Globulin, Total: 2.5 g/dL (ref 1.5–4.5)
Glucose: 120 mg/dL — ABNORMAL HIGH (ref 65–99)
Potassium: 4.2 mmol/L (ref 3.5–5.2)
Sodium: 142 mmol/L (ref 134–144)
Total Protein: 6.5 g/dL (ref 6.0–8.5)

## 2020-01-22 LAB — MAGNESIUM: Magnesium: 1.4 mg/dL — ABNORMAL LOW (ref 1.6–2.3)

## 2020-01-22 LAB — PHOSPHORUS: Phosphorus: 3.3 mg/dL (ref 3.0–4.3)

## 2020-01-28 ENCOUNTER — Other Ambulatory Visit: Payer: Commercial Managed Care - PPO

## 2020-01-28 ENCOUNTER — Other Ambulatory Visit: Payer: Self-pay

## 2020-01-28 DIAGNOSIS — C569 Malignant neoplasm of unspecified ovary: Secondary | ICD-10-CM

## 2020-01-29 LAB — CBC WITH DIFFERENTIAL/PLATELET
Basophils Absolute: 0 10*3/uL (ref 0.0–0.2)
Basos: 0 %
EOS (ABSOLUTE): 0 10*3/uL (ref 0.0–0.4)
Eos: 0 %
Hematocrit: 32.1 % — ABNORMAL LOW (ref 34.0–46.6)
Hemoglobin: 9.5 g/dL — ABNORMAL LOW (ref 11.1–15.9)
Immature Grans (Abs): 0.1 10*3/uL (ref 0.0–0.1)
Immature Granulocytes: 2 %
Lymphocytes Absolute: 1.2 10*3/uL (ref 0.7–3.1)
Lymphs: 38 %
MCH: 21.5 pg — ABNORMAL LOW (ref 26.6–33.0)
MCHC: 29.6 g/dL — ABNORMAL LOW (ref 31.5–35.7)
MCV: 73 fL — ABNORMAL LOW (ref 79–97)
Monocytes Absolute: 0.1 10*3/uL (ref 0.1–0.9)
Monocytes: 2 %
Neutrophils Absolute: 1.8 10*3/uL (ref 1.4–7.0)
Neutrophils: 58 %
Platelets: 631 10*3/uL — ABNORMAL HIGH (ref 150–450)
RBC: 4.42 x10E6/uL (ref 3.77–5.28)
RDW: 28.1 % — ABNORMAL HIGH (ref 11.7–15.4)
WBC: 3.1 10*3/uL — ABNORMAL LOW (ref 3.4–10.8)

## 2020-01-31 ENCOUNTER — Encounter: Payer: Self-pay | Admitting: Family Medicine

## 2020-01-31 ENCOUNTER — Ambulatory Visit (INDEPENDENT_AMBULATORY_CARE_PROVIDER_SITE_OTHER): Payer: Commercial Managed Care - PPO | Admitting: Family Medicine

## 2020-01-31 ENCOUNTER — Ambulatory Visit
Admission: RE | Admit: 2020-01-31 | Discharge: 2020-01-31 | Disposition: A | Discharge status: Home / Self Care | Payer: Commercial Managed Care - PPO | Source: Ambulatory Visit | Attending: Family Medicine | Admitting: Family Medicine

## 2020-01-31 ENCOUNTER — Other Ambulatory Visit: Payer: Self-pay

## 2020-01-31 VITALS — BP 140/83 | HR 98 | Temp 98.8°F

## 2020-01-31 DIAGNOSIS — R2231 Localized swelling, mass and lump, right upper limb: Secondary | ICD-10-CM | POA: Diagnosis not present

## 2020-01-31 DIAGNOSIS — I10 Essential (primary) hypertension: Secondary | ICD-10-CM | POA: Diagnosis not present

## 2020-01-31 DIAGNOSIS — R221 Localized swelling, mass and lump, neck: Secondary | ICD-10-CM
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
## 2020-01-31 NOTE — Assessment & Plan Note (Signed)
BP up again slightly. Possibly stress related. Will work on [employer] and work up swelling. May need PRN amlodipine for days when BP >220 systolic. Continue to monitor closely.

## 2020-01-31 NOTE — Progress Notes (Signed)
Called with result. Will use ice this weekend and monitor. If not improving consider CT chest. Call with any concerns.

## 2020-02-01 DIAGNOSIS — I871 Compression of vein: Secondary | ICD-10-CM | POA: Insufficient documentation

## 2020-02-04 ENCOUNTER — Other Ambulatory Visit: Payer: Commercial Managed Care - PPO

## 2020-02-06 ENCOUNTER — Encounter: Payer: Self-pay | Admitting: Family Medicine

## 2020-02-07 ENCOUNTER — Other Ambulatory Visit: Payer: Self-pay

## 2020-02-07 ENCOUNTER — Ambulatory Visit (INDEPENDENT_AMBULATORY_CARE_PROVIDER_SITE_OTHER): Payer: Commercial Managed Care - PPO | Admitting: Family Medicine

## 2020-02-07 ENCOUNTER — Encounter: Payer: Self-pay | Admitting: Family Medicine

## 2020-02-07 VITALS — BP 146/84 | HR 114 | Temp 98.4°F | Wt 167.0 lb

## 2020-02-07 DIAGNOSIS — I10 Essential (primary) hypertension: Secondary | ICD-10-CM | POA: Diagnosis not present

## 2020-02-07 DIAGNOSIS — I82B11 Acute embolism and thrombosis of right subclavian vein: Secondary | ICD-10-CM | POA: Diagnosis not present

## 2020-02-07 DIAGNOSIS — M7989 Other specified soft tissue disorders: Secondary | ICD-10-CM

## 2020-02-07 DIAGNOSIS — M79601 Pain in right arm: Secondary | ICD-10-CM

## 2020-02-07 DIAGNOSIS — E041 Nontoxic single thyroid nodule: Secondary | ICD-10-CM | POA: Diagnosis not present

## 2020-02-07 NOTE — Progress Notes (Signed)
BP (!) 146/84   Pulse (!) 114   Temp 98.4 F (36.9 C) (Oral)   Wt 167 lb (75.8 kg)   SpO2 100%   BMI 27.16 kg/m    Subjective:    Patient ID: Diane Acosta, female    DOB: [birth year], 59 y.o.   MRN: [id number]  HPI: Diane Acosta is a 59 y.o. female  Chief Complaint  Patient presents with  . Hospitalization Follow-up    pt states was hospitalized x 2.5 days last week   Transition of [facility] Follow up.   Hospital/Facility: [facility] D/C Physician: Dr. Rosezetta Schlatter D/C Date: 02/01/20  Records Requested:  02/07/20 Records Received:  02/07/20 Records Reviewed: 02/07/20  Diagnoses on Discharge: SVC syndrome  Date of interactive Contact within 48 hours of discharge: 02/04/20 Contact was through: phone  Date of 7 day or 14 day face-to-face visit: 02/07/20   within 7 days  Outpatient Encounter Medications as of 02/07/2020  Medication Sig  . apixaban (ELIQUIS) 5 MG TABS tablet Take 5 mg by mouth 2 (two) times daily.  Marland Kitchen aspirin 81 MG chewable tablet Chew 1 tablet (81 mg total) by mouth daily.  Marland Kitchen dexamethasone (DECADRON) 2 MG tablet Take by mouth.  . dexamethasone (DECADRON) 4 MG tablet Take one pill by mouth twice a day with meals, for first 3 days after chemotherapy. Take one pill in AM on the 4th day. (Patient not taking: Reported on 01/31/2020)  . lidocaine-prilocaine (EMLA) cream Apply to affected area once (Patient not taking: Reported on 01/31/2020)  . Loratadine (CLARITIN PO) Take by mouth. (Patient not taking: Reported on 02/07/2020)  . LORazepam (ATIVAN) 0.5 MG tablet SMARTSIG:1-2 Tablet(s) By Mouth PRN (Patient not taking: Reported on 01/31/2020)  . ondansetron (ZOFRAN) 8 MG tablet Take 8 mg by mouth 3 (three) times daily. (Patient not taking: Reported on 01/31/2020)  . SUMAtriptan (IMITREX) 20 MG/ACT nasal spray Place 1 spray (20 mg total) into the nose every 2 (two) hours as needed for migraine or headache. May repeat in 2 hours if headache persists or recurs.  (Patient not taking: Reported on 01/31/2020)   No facility-administered encounter medications on file as of 02/07/2020.  Per Hospitalist: "Hospital Course: Diane Acosta is an 59 y.o. female with plat sensitive recurrent stage IC endometrioid ovarian cancer admitted on 02/01/2020 for catheter-associatedrightsubclavianDVT. Her hospital course is complicated by the following:  Right subclavian thrombus a/w port catheter Port catheter was placed on 11/25/19. Patient presented with R upper extremity, chest, and facial swelling and erythema. CV and pulmonary exam were benign. CT Chest/Neck (7/24) showed large thrombus extending from mid-right subclavian vein into innominate vein surrounding the central venous catheter and hypoplastic RIJ. VIR did not recommend intervention at this time, continue anticoagulation. Heparin was switched to therapeutic lovenox 70mg  BID on 7/24. Eliquis was started on evening of discharge with 4 days of 10 mg BID loading week (total 7-day loading week including Lovenox doses inpatient), decreasing to 5 mg BID on 7/31 for maintenance therapeutic dose to be continued indefinitely.   Hypertension Patient was admitted with home amlodipine, which was held due to new orthostatic hypotension. Her blood pressures subsequently were elevated due to steroid administration. She will continue to hold until follow-up with oncologist.   Patient was deemed stable on HD#3. On day of discharge patient's pain was well controlled on oral medications, she was tolerating a regular diet without nausea/vomiting, voiding spontaneously, having bowel movements without issue, and ambulating. She will follow up with Dr.  Bae-Jump on 02/20/20."   Diagnostic Tests Reviewed:  -- Mildly heterogeneous thyroid with multiple colloid cysts, as above.   ________________________________  TI-RADS 1 (0 points): Benign- No FNA indication  TI-RADS 2 (2 points): Not suspicious- No FNA indicated  TI-RADS 3 (3 points):  Mildly suspicious- FNA is > or = 2.5 cm, follow if > or = 1.5 cm  TI-RADS 4 (4-6 points): Moderately suspicious- FNA if > or = 1.5 or follow if > or = 1.0 cm  TI-RADS 5 (7 or more points): Highly suspicious- FNA if >=1.0 cm, follow if >=0.5 cm  NOTE: The TI-RADS classification of thyroid nodules has been adopted to standardize risk stratification based on a common lexicon to inform practitioners about which nodules warrant biopsy. The imaging criteria for TI-RADS criteria and documentation are available online at [employer]   Please see below for data measurements:    Right thyroid: length 3.9 cm; width 1.3 cm; height 1.3 cm  Left thyroid: length 3.9 cm; width 1.1 cm; height 1.2 cm   Isthmus: 0.32 cm   Lymph node measurements:  Right I cm  Right II cm  Right III cm  Right IV cm  Right V cm  Right VI cm   Left I cm  Left II cm  Left III cm  Left IV cm  Left V cm  Left VI cm Narrative Performed by [facility] RAD EXAM: US SOFT TISSUE HEAD AND NECK  DATE: 02/03/2020 11:49 AM  ACCESSION: [id number] UN  DICTATED: 02/03/2020 11:49 AM  INTERPRETATION LOCATION: [place]   CLINICAL INDICATION: 59 years old Female with palpable nodules.   COMPARISON: CT neck soft tissue February 01, 2020   TECHNIQUE: Ultrasound views of the thyroid were obtained using gray scale and limited color Doppler imaging.   FINDINGS:  Thyroid size: see below   Thyroid echotexture: Mildly heterogeneous.   Lymph nodes: No adenopathy   There are multiple colloid cysts scattered throughout the bilateral thyroid lobes, the largest of which measures up to 0.9 cm in the superior right thyroid lobe.  Interface, Rad Results In - 02/01/2020 11:46 AM EDT  Formatting of this note might be different from the original.  EXAM: CT CHEST W CONTRAST  DATE: 02/01/2020 10:19 AM  ACCESSION: [id number] UN  DICTATED: 02/01/2020 10:51 AM  INTERPRETATION LOCATION: [facility]   CLINICAL  INDICATION: 59 years old Female with neck, chest, and arm swelling in setting of port, negative duplex    COMPARISON: 10/08/2019   TECHNIQUE: A helical CT scan was obtained with IV contrast from the thoracic inlet through the hemidiaphragms. Images were reconstructed in the axial plane. Coronal and sagittal reformatted images of the chest were also provided for further evaluation of the lung parenchyma.   FINDINGS:   LOWER NECK: Please refer to concurrent CT neck soft tissue for findings above the thorax.   AIRWAYS, LUNGS, PLEURA: Clear central tracheobronchial tree. No lung consolidation. 6 mm pleural-based left upper lobe and 4 mm right lower lobe pulmonary nodules, unchanged. No pleural effusion.   MEDIASTINUM: Right-sided central venous catheter with tip in the distal SVC. There is hypodense material surrounding the tip of the catheter extending to the cavoatrial junction (CT 50-55). Additional hypodense material within the mid right subclavian vein extending through the innominate vein, better evaluated on concurrent CT neck. Normal heart size. No pericardial effusion.  Normal caliber thoracic aorta. No mediastinal lymphadenopathy.   IMAGED ABDOMEN: Unremarkable.   SOFT TISSUES: Unremarkable.   BONES: No acute osseous abnormalities. Multilevel degenerative changes  of the thoracic spine.    IMPRESSION:  Thrombus surrounding the tip of the central venous catheter extending to the cavoatrial junction with additional thrombus in the mid right subclavian vein andinnominate vein.   AM: Computed tomography, soft tissue neck with contrast material.  DATE: 02/01/2020 10:19 AM  ACCESSION: [id number] UN  DICTATED: 02/01/2020 10:22 AM  INTERPRETATION LOCATION: [place]   CLINICAL INDICATION: 59 years old Female with neck, chest and arm swelling in setting of port, negative duplex    COMPARISON: MRA neck 03/19/2016   TECHNIQUE: Axial CT images of the neck from the skull base through  the thoracic inlet after the administration of intravenous contrast. Coronal and sagittal reformattedimages, bone and soft tissue algorithm are provided.   FINDINGS:  The visualized portions of the brain and the posterior fossa are normal.   The paranasal sinuses are normal. The orbits are normal. The nasal cavity and nasopharynx are normal.   Theoropharynx and oral cavity are normal. The parapharyngeal spaces are clear. The salivary glands are normal.   The larynx and hypopharynx are normal.   There is no lymphadenopathy.   7 mm hypodense left inferior thyroid nodule (2:82) and 8 mm right superior thyroid nodule (2:75) several additional scattered subcentimeter hypodensities.   Multilevel degenerative changes of the cervical spine with prominent left mid cervical facet arthropathy. The lung apices are normal.    Right-sided central venous catheter courses inferiorly through the SVC with tip out of field of view. There is a thrombus extending from the mid right subclavian vein and extending around the catheter through the innominate vein (4:32). The right internal jugular vein is hypoplastic and hypoenhancing throughout its entire course extending into the sigmoid and transverse sinuses.  EXAM: XR CHEST PORTABLE  DATE: 02/01/2020 6:48 AM  ACCESSION: [id number] UN  DICTATED: 02/01/2020 6:55 AM  INTERPRETATION LOCATION: [facility]   CLINICAL INDICATION: 59 years old Female with eval central line ;    COMPARISON: Chest radiograph 05/26/2011.   TECHNIQUE: Portable Chest Radiograph.   FINDINGS:   Right chest wall port appears intact with tip terminating over the distal SVC.   Lungs radiographically clear.   No pleural effusion or pneumothorax.   Stable cardiomediastinal silhouette.   Metallic density overlying the neck is favored to be external.  Disposition: Home  Consults: Ocology  Discharge Instructions:  Continue to follow with oncology  Disease/illness Education:  Discussed today  Home Health/Community Services Discussions/Referrals: N/A  Establishment or re-establishment of referral orders for community resources: N/A  Discussion with other health care providers: N/A  Assessment and Support of treatment regimen adherence: Excellent  Appointments Coordinated with: Patient, husband and daughter  Education for self-management, independent living, and ADLs: Discussed today  Since getting out the hospital, Kalya has been doing a lot better. She notes that she is not havin as much of the swelling on her chest and her neck. She notes that when she went to the hospital on Saturday, she was having pain and throbbing in her neck and arm which prompted her to go. She is feeling better from that now. She continues to have swelling in R arm which occasionally has some numbness and tingling. Her chemo is on hold for a week. She notes that she is concerned about the thyroid nodules on her [place], but she doesn't want to see endocrinology just yet. She notes that her BP has been running in the 130s at home. She has not had any headaches. She is otherwise doing well with no  other concerns or complaints at this time.   Relevant past medical, surgical, family and social history reviewed and updated as indicated. Interim medical history since our last visit reviewed. Allergies and medications reviewed and updated.  Review of Systems  Constitutional: Negative.   HENT: Negative.   Respiratory: Negative.   Cardiovascular: Negative.   Gastrointestinal: Negative.   Musculoskeletal: Negative.   Skin: Negative.   Neurological: Negative.   Hematological: Negative.   Psychiatric/Behavioral: Negative for behavioral problems, confusion, decreased concentration, dysphoric mood, hallucinations, self-injury, sleep disturbance and suicidal ideas. The patient is nervous/anxious. The patient is not hyperactive.     Per HPI unless specifically indicated above     Objective:    BP  (!) 146/84   Pulse (!) 114   Temp 98.4 F (36.9 C) (Oral)   Wt 167 lb (75.8 kg)   SpO2 100%   BMI 27.16 kg/m   Wt Readings from Last 3 Encounters:  02/07/20 167 lb (75.8 kg)  01/20/20 167 lb (75.8 kg)  10/29/19 166 lb 8 oz (75.5 kg)    Physical Exam Vitals and nursing note reviewed.  Constitutional:      General: She is not in acute distress.    Appearance: Normal appearance. She is not ill-appearing, toxic-appearing or diaphoretic.  HENT:     Head: Normocephalic and atraumatic.     Right Ear: External ear normal.     Left Ear: External ear normal.     Nose: Nose normal.     Mouth/Throat:     Mouth: Mucous membranes are moist.     Pharynx: Oropharynx is clear.  Eyes:     General: No scleral icterus.       Right eye: No discharge.        Left eye: No discharge.     Extraocular Movements: Extraocular movements intact.     Conjunctiva/sclera: Conjunctivae normal.     Pupils: Pupils are equal, round, and reactive to light.  Cardiovascular:     Rate and Rhythm: Normal rate and regular rhythm.     Pulses: Normal pulses.     Heart sounds: Normal heart sounds. No murmur heard.  No friction rub. No gallop.   Pulmonary:     Effort: Pulmonary effort is normal. No respiratory distress.     Breath sounds: Normal breath sounds. No stridor. No wheezing, rhonchi or rales.  Chest:     Chest wall: No tenderness.     Comments: Continues with some swelling on the R side of her chest into her neck- significantly less than previous.  Musculoskeletal:        General: Normal range of motion.     Cervical back: Normal range of motion and neck supple.  Skin:    General: Skin is warm and dry.     Capillary Refill: Capillary refill takes less than 2 seconds.     Coloration: Skin is not jaundiced or pale.     Findings: No bruising, erythema, lesion or rash.  Neurological:     General: No focal deficit present.     Mental Status: She is alert and oriented to person, place, and time. Mental  status is at baseline.  Psychiatric:        Mood and Affect: Mood normal.        Behavior: Behavior normal.        Thought Content: Thought content normal.        Judgment: Judgment normal.     Results for orders placed or  performed in visit on 01/28/20  CBC with Differential/Platelet  Result Value Ref Range   WBC 3.1 (L) 3.4 - 10.8 x10E3/uL   RBC 4.42 3.77 - 5.28 x10E6/uL   Hemoglobin 9.5 (L) 11.1 - 15.9 g/dL   Hematocrit 32.1 (L) 34.0 - 46.6 %   MCV 73 (L) 79 - 97 fL   MCH 21.5 (L) 26.6 - 33.0 pg   MCHC 29.6 (L) 31 - 35 g/dL   RDW 28.1 (H) 11.7 - 15.4 %   Platelets 631 (H) 150 - 450 x10E3/uL   Neutrophils 58 Not Estab. %   Lymphs 38 Not Estab. %   Monocytes 2 Not Estab. %   Eos 0 Not Estab. %   Basos 0 Not Estab. %   Neutrophils Absolute 1.8 1 - 7 x10E3/uL   Lymphocytes Absolute 1.2 0 - 3 x10E3/uL   Monocytes Absolute 0.1 0 - 0 x10E3/uL   EOS (ABSOLUTE) 0.0 0.0 - 0.4 x10E3/uL   Basophils Absolute 0.0 0 - 0 x10E3/uL   Immature Granulocytes 2 Not Estab. %   Immature Grans (Abs) 0.1 0.0 - 0.1 x10E3/uL   Hematology Comments: Note:       Assessment & Plan:   Problem List Items Addressed This Visit      Cardiovascular and Mediastinum   Essential hypertension    Running borderline high today, but dropping very low after chemo. Concerned about the lows. Continue to monitor. If BP running in the 488Q+ systolic- will call and start 2.5mg  of amlodipine either daily or PRN. Continue to monitor.       Relevant Medications   apixaban (ELIQUIS) 5 MG TABS tablet   Right subclavian vein thrombosis ([place]) - Primary    Now on lovenox and eliquis. Tolerating it well. Chemo on hold for 1 week. Follow up with oncology as scheduled. Continue to monitor. Call with any concerns.       Relevant Medications   apixaban (ELIQUIS) 5 MG TABS tablet     Endocrine   Thyroid nodule    Discussed referral to endocrinology for ?FNA. She notes that she has too much going on right now and would  like to consider it. If she changes her mind and we will place referral to [facility]. Continue to monitor. Checking thyroid labs at lab draw later this week.       Relevant Orders   Thyroid Panel With TSH    Other Visit Diagnoses    Pain and swelling of right upper extremity       Discussed positioning and elevation. If not improving, advised lymphedema sleeve. Continue to monitor. Call with any concerns.        Follow up plan: Return in about 3 weeks (around 02/28/2020).  >45 minutes spent with patient today

## 2020-02-07 NOTE — Patient Instructions (Signed)
Thyroid Nodule  A thyroid nodule is an isolated growth of thyroid cells that forms a lump in your thyroid gland. The thyroid gland is a butterfly-shaped gland. It is found in the lower front of your neck. This gland sends chemical messengers (hormones) through your blood to all parts of your body. These hormones are important in regulating your body temperature and helping your body to use energy. Thyroid nodules are common. Most are not cancerous (benign). You may have one nodule or several nodules. Different types of thyroid nodules include nodules that:  Grow and fill with fluid (thyroid cysts).  Produce too much thyroid hormone (hot nodules or hyperthyroid).  Produce no thyroid hormone (cold nodules or hypothyroid).  Form from cancer cells (thyroid cancers). What are the causes? In most cases, the cause of this condition is not known. What increases the risk? The following factors may make you more likely to develop this condition.  Age. Thyroid nodules become more common in people who are older than 59 years of age.  Gender. ? Benign thyroid nodules are more common in women. ? Cancerous (malignant) thyroid nodules are more common in men.  A family history that includes: ? Thyroid nodules. ? Pheochromocytoma. ? Thyroid carcinoma. ? Hyperparathyroidism.  Certain kinds of thyroid diseases, such as Hashimoto's thyroiditis.  Lack of iodine in your diet.  A history of head and neck radiation, such as from previous cancer treatment. What are the signs or symptoms? In many cases, there are no symptoms. If you have symptoms, they may include:  A lump in your lower neck.  Feeling a lump or tickle in your throat.  Pain in your neck, jaw, or ear.  Having trouble swallowing. Hot nodules may cause symptoms that include:  Weight loss.  Warm, flushed skin.  Feeling hot.  Feeling nervous.  A racing heartbeat. Cold nodules may cause symptoms that include:  Weight  gain.  Dry skin.  Brittle hair. This may also occur with hair loss.  Feeling cold.  Fatigue. Thyroid cancer nodules may cause symptoms that include:  Hard nodules that feel stuck to the thyroid gland.  Hoarseness.  Lumps in the glands near your thyroid (lymph nodes). How is this diagnosed? A thyroid nodule may be felt by your health care provider during a physical exam. This condition may also be diagnosed based on your symptoms. You may also have tests, including:  An ultrasound. This may be done to confirm the diagnosis.  A biopsy. This involves taking a sample from the nodule and looking at it under a microscope.  Blood tests to make sure that your thyroid is working properly.  A thyroid scan. This test uses a radioactive tracer injected into a vein to create an image of the thyroid gland on a computer screen.  Imaging tests such as MRI or CT scan. These may be done if: ? Your nodule is large. ? Your nodule is blocking your airway. ? Cancer is suspected. How is this treated? Treatment depends on the cause and size of your nodule or nodules. If the nodule is benign, treatment may not be necessary. Your health care provider may monitor the nodule to see if it goes away without treatment. If the nodule continues to grow, is cancerous, or does not go away, treatment may be needed. Treatment may include:  Having a cystic nodule drained with a needle.  Ablation therapy. In this treatment, alcohol is injected into the area of the nodule to destroy the cells. Ablation with heat (  thermal ablation) may also be used.  Radioactive iodine. In this treatment, radioactive iodine is given as a pill or liquid that you drink. This substance causes the thyroid nodule to shrink.  Surgery to remove the nodule. Part or all of your thyroid gland may need to be removed as well.  Medicines. Follow these instructions at home:  Pay attention to any changes in your nodule.  Take  over-the-counter and prescription medicines only as told by your health care provider.  Keep all follow-up visits as told by your health care provider. This is important. Contact a health care provider if:  Your voice changes.  You have trouble swallowing.  You have pain in your neck, ear, or jaw that is getting worse.  Your nodule gets bigger.  Your nodule starts to make it harder for you to breathe.  Your muscles look like they are shrinking (muscle wasting). Get help right away if:  You have chest pain.  There is a loss of consciousness.  You have a sudden fever.  You feel confused.  You are seeing or hearing things that other people do not see or hear (having hallucinations).  You feel very weak.  You have mood swings.  You feel very restless.  You feel suddenly nauseous or throw up.  You suddenly have diarrhea. Summary  A thyroid nodule is an isolated growth of thyroid cells that forms a lump in your thyroid gland.  Thyroid nodules are common. Most are not cancerous (benign). You may have one nodule or several nodules.  Treatment depends on the cause and size of your nodule or nodules. If the nodule is benign, treatment may not be necessary.  Your health care provider may monitor the nodule to see if it goes away without treatment. If the nodule continues to grow, is cancerous, or does not go away, treatment may be needed. This information is not intended to replace advice given to you by your health care provider. Make sure you discuss any questions you have with your health care provider. Document Revised: 02/09/2018 Document Reviewed: 02/12/2018 Elsevier Patient Education  2020 [employer]  

## 2020-02-10 DIAGNOSIS — I82B11 Acute embolism and thrombosis of right subclavian vein: Secondary | ICD-10-CM | POA: Insufficient documentation

## 2020-02-10 DIAGNOSIS — E041 Nontoxic single thyroid nodule: Secondary | ICD-10-CM | POA: Insufficient documentation

## 2020-02-10 NOTE — Assessment & Plan Note (Signed)
Now on lovenox and eliquis. Tolerating it well. Chemo on hold for 1 week. Follow up with oncology as scheduled. Continue to monitor. Call with any concerns.

## 2020-02-10 NOTE — Assessment & Plan Note (Signed)
Discussed referral to endocrinology for ?FNA. She notes that she has too much going on right now and would like to consider it. If she changes her mind and we will place referral to [facility]. Continue to monitor. Checking thyroid labs at lab draw later this week.

## 2020-02-10 NOTE — Assessment & Plan Note (Signed)
Running borderline high today, but dropping very low after chemo. Concerned about the lows. Continue to monitor. If BP running in the 871L+ systolic- will call and start 2.5mg  of amlodipine either daily or PRN. Continue to monitor.

## 2020-02-11 ENCOUNTER — Other Ambulatory Visit: Payer: Commercial Managed Care - PPO

## 2020-02-11 ENCOUNTER — Other Ambulatory Visit: Payer: Self-pay

## 2020-02-11 DIAGNOSIS — E041 Nontoxic single thyroid nodule: Secondary | ICD-10-CM

## 2020-02-11 DIAGNOSIS — C569 Malignant neoplasm of unspecified ovary: Secondary | ICD-10-CM

## 2020-02-12 LAB — COMPREHENSIVE METABOLIC PANEL
ALT: 22 IU/L (ref 0–32)
AST: 20 IU/L (ref 0–40)
Albumin/Globulin Ratio: 1.6 (ref 1.2–2.2)
Albumin: 4.1 g/dL (ref 3.8–4.9)
Alkaline Phosphatase: 107 IU/L (ref 48–121)
BUN/Creatinine Ratio: 12 (ref 9–23)
BUN: 6 mg/dL (ref 6–24)
Bilirubin Total: <0.2 mg/dL (ref 0.0–1.2)
CO2: 24 mmol/L (ref 20–29)
Calcium: 9.4 mg/dL (ref 8.7–10.2)
Chloride: 102 mmol/L (ref 96–106)
Creatinine, Ser: 0.52 mg/dL — ABNORMAL LOW (ref 0.57–1.00)
GFR calc Af Amer: 122 mL/min/{1.73_m2} (ref >59)
GFR calc non Af Amer: 106 mL/min/{1.73_m2} (ref >59)
Globulin, Total: 2.5 g/dL (ref 1.5–4.5)
Glucose: 92 mg/dL (ref 65–99)
Potassium: 4.8 mmol/L (ref 3.5–5.2)
Sodium: 140 mmol/L (ref 134–144)
Total Protein: 6.6 g/dL (ref 6.0–8.5)

## 2020-02-12 LAB — THYROID PANEL WITH TSH
Free Thyroxine Index: 2.1 (ref 1.2–4.9)
T3 Uptake Ratio: 26 % (ref 24–39)
T4, Total: 8.2 ug/dL (ref 4.5–12.0)
TSH: 1.82 u[IU]/mL (ref 0.450–4.500)

## 2020-02-12 LAB — CBC WITH DIFFERENTIAL/PLATELET
Basophils Absolute: 0 x10E3/uL (ref 0.0–0.2)
Basos: 0 %
EOS (ABSOLUTE): 0.1 x10E3/uL (ref 0.0–0.4)
Eos: 2 %
Hematocrit: 29.8 % — ABNORMAL LOW (ref 34.0–46.6)
Hemoglobin: 9.1 g/dL — ABNORMAL LOW (ref 11.1–15.9)
Immature Grans (Abs): 0 x10E3/uL (ref 0.0–0.1)
Immature Granulocytes: 0 %
Lymphocytes Absolute: 1.2 x10E3/uL (ref 0.7–3.1)
Lymphs: 27 %
MCH: 22.7 pg — ABNORMAL LOW (ref 26.6–33.0)
MCHC: 30.5 g/dL — ABNORMAL LOW (ref 31.5–35.7)
MCV: 74 fL — ABNORMAL LOW (ref 79–97)
Monocytes Absolute: 0.8 x10E3/uL (ref 0.1–0.9)
Monocytes: 17 %
Neutrophils Absolute: 2.5 x10E3/uL (ref 1.4–7.0)
Neutrophils: 54 %
Platelets: 569 x10E3/uL — ABNORMAL HIGH (ref 150–450)
RBC: 4.01 x10E6/uL (ref 3.77–5.28)
RDW: 27.8 % — ABNORMAL HIGH (ref 11.7–15.4)
WBC: 4.6 x10E3/uL (ref 3.4–10.8)

## 2020-02-12 LAB — MAGNESIUM: Magnesium: 1.7 mg/dL (ref 1.6–2.3)

## 2020-02-12 LAB — PHOSPHORUS: Phosphorus: 3.7 mg/dL (ref 3.0–4.3)

## 2020-02-18 ENCOUNTER — Other Ambulatory Visit: Payer: Commercial Managed Care - PPO

## 2020-02-18 ENCOUNTER — Other Ambulatory Visit: Payer: Self-pay

## 2020-02-18 DIAGNOSIS — C569 Malignant neoplasm of unspecified ovary: Secondary | ICD-10-CM

## 2020-02-19 LAB — CBC WITH DIFFERENTIAL/PLATELET
Basophils Absolute: 0 10*3/uL (ref 0.0–0.2)
Basos: 0 %
EOS (ABSOLUTE): 0 10*3/uL (ref 0.0–0.4)
Eos: 0 %
Hematocrit: 32.1 % — ABNORMAL LOW (ref 34.0–46.6)
Hemoglobin: 9.6 g/dL — ABNORMAL LOW (ref 11.1–15.9)
Immature Grans (Abs): 0.1 10*3/uL (ref 0.0–0.1)
Immature Granulocytes: 1 %
Lymphocytes Absolute: 1.6 10*3/uL (ref 0.7–3.1)
Lymphs: 31 %
MCH: 22.4 pg — ABNORMAL LOW (ref 26.6–33.0)
MCHC: 29.9 g/dL — ABNORMAL LOW (ref 31.5–35.7)
MCV: 75 fL — ABNORMAL LOW (ref 79–97)
Monocytes Absolute: 0.2 10*3/uL (ref 0.1–0.9)
Monocytes: 4 %
NRBC: 1 % — ABNORMAL HIGH (ref 0–0)
Neutrophils Absolute: 3.3 10*3/uL (ref 1.4–7.0)
Neutrophils: 64 %
Platelets: 549 10*3/uL — ABNORMAL HIGH (ref 150–450)
RBC: 4.29 x10E6/uL (ref 3.77–5.28)
RDW: 25.6 % — ABNORMAL HIGH (ref 11.7–15.4)
WBC: 5.2 10*3/uL (ref 3.4–10.8)

## 2020-02-20 ENCOUNTER — Encounter: Payer: Self-pay | Admitting: Family Medicine

## 2020-02-22 ENCOUNTER — Encounter: Payer: Self-pay | Admitting: Family Medicine

## 2020-02-25 ENCOUNTER — Ambulatory Visit: Payer: Commercial Managed Care - PPO | Admitting: Family Medicine

## 2020-02-25 ENCOUNTER — Encounter: Payer: Self-pay | Admitting: Family Medicine

## 2020-02-25 ENCOUNTER — Other Ambulatory Visit: Payer: Commercial Managed Care - PPO

## 2020-02-25 ENCOUNTER — Telehealth (INDEPENDENT_AMBULATORY_CARE_PROVIDER_SITE_OTHER): Payer: Commercial Managed Care - PPO | Admitting: Family Medicine

## 2020-02-25 VITALS — BP 147/70 | HR 110

## 2020-02-25 DIAGNOSIS — I10 Essential (primary) hypertension: Secondary | ICD-10-CM | POA: Diagnosis not present

## 2020-02-25 MED ORDER — DEXAMETHASONE 2 MG PO TABS: ORAL_TABLET | ORAL | 3 refills | Status: DC

## 2020-02-25 NOTE — Progress Notes (Signed)
BP (!) 147/70   Pulse (!) 110    Subjective:    Patient ID: Diane Acosta, female    DOB: 11/23/1960, 59 y.o.   MRN: [id number]  HPI: Diane Acosta is a 59 y.o. female  Chief Complaint  Patient presents with  . Hypertension  . Chemotherapy Follow Up   HYPERTENSION- BP dropping very low when she's having chemo.  Hypertension status: labile  Satisfied with current treatment? no Duration of hypertension: chronic BP monitoring frequency:  a few times a week BP range: 130s-150s BP medication side effects:  yes Medication compliance: excellent compliance Aspirin: yes Recurrent headaches: no Visual changes: no Palpitations: no Dyspnea: no Chest pain: no Lower extremity edema: no Dizzy/lightheaded: yes  Still having swelling in her face and having tingling in her R arm from the clot. She is feeling better with it, but it's taking time. Has been having some increased neuropathy in her feet and into her ankles. Has lost her sense of taste. She thinks that all of these are symptoms of the chemo. She is doing OK in general, but is noticing more symptoms. No other concerns or complaints at this time.   Relevant past medical, surgical, family and social history reviewed and updated as indicated. Interim medical history since our last visit reviewed. Allergies and medications reviewed and updated.  Review of Systems  Constitutional: Negative.   Respiratory: Negative.   Cardiovascular: Negative.   Gastrointestinal: Negative.   Musculoskeletal: Negative.   Neurological: Negative.   Psychiatric/Behavioral: Negative.     Per HPI unless specifically indicated above     Objective:    BP (!) 147/70   Pulse (!) 110   Wt Readings from Last 3 Encounters:  02/07/20 167 lb (75.8 kg)  01/20/20 167 lb (75.8 kg)  10/29/19 166 lb 8 oz (75.5 kg)    Physical Exam Vitals and nursing note reviewed.  Constitutional:      General: She is not in acute distress.    Appearance:  Normal appearance. She is not ill-appearing, toxic-appearing or diaphoretic.  HENT:     Head: Normocephalic and atraumatic.     Right Ear: External ear normal.     Left Ear: External ear normal.     Nose: Nose normal.     Mouth/Throat:     Mouth: Mucous membranes are moist.     Pharynx: Oropharynx is clear.  Eyes:     General: No scleral icterus.       Right eye: No discharge.        Left eye: No discharge.     Conjunctiva/sclera: Conjunctivae normal.     Pupils: Pupils are equal, round, and reactive to light.  Pulmonary:     Effort: Pulmonary effort is normal. No respiratory distress.     Comments: Speaking in full sentences Musculoskeletal:        General: Normal range of motion.     Cervical back: Normal range of motion.  Skin:    Coloration: Skin is not jaundiced or pale.     Findings: No bruising, erythema, lesion or rash.  Neurological:     Mental Status: She is alert and oriented to person, place, and time. Mental status is at baseline.  Psychiatric:        Mood and Affect: Mood normal.        Behavior: Behavior normal.        Thought Content: Thought content normal.        Judgment: Judgment normal.  Results for orders placed or performed in visit on 02/18/20  CBC with Differential/Platelet  Result Value Ref Range   WBC 5.2 3.4 - 10.8 x10E3/uL   RBC 4.29 3.77 - 5.28 x10E6/uL   Hemoglobin 9.6 (L) 11.1 - 15.9 g/dL   Hematocrit 32.1 (L) 34.0 - 46.6 %   MCV 75 (L) 79 - 97 fL   MCH 22.4 (L) 26.6 - 33.0 pg   MCHC 29.9 (L) 31 - 35 g/dL   RDW 25.6 (H) 11.7 - 15.4 %   Platelets 549 (H) 150 - 450 x10E3/uL   Neutrophils 64 Not Estab. %   Lymphs 31 Not Estab. %   Monocytes 4 Not Estab. %   Eos 0 Not Estab. %   Basos 0 Not Estab. %   Neutrophils Absolute 3.3 1 - 7 x10E3/uL   Lymphocytes Absolute 1.6 0 - 3 x10E3/uL   Monocytes Absolute 0.2 0 - 0 x10E3/uL   EOS (ABSOLUTE) 0.0 0.0 - 0.4 x10E3/uL   Basophils Absolute 0.0 0 - 0 x10E3/uL   Immature Granulocytes 1 Not  Estab. %   Immature Grans (Abs) 0.1 0.0 - 0.1 x10E3/uL   NRBC 1 (H) 0 - 0 %   Hematology Comments: Note:       Assessment & Plan:   Problem List Items Addressed This Visit      Cardiovascular and Mediastinum   Essential hypertension - Primary    Discussed PRN medicine, but with how low she is dropping after chemo, we will hold on restarting her medicine right now to avoid any hypotension. Continue to monitor. Call with any concerns.           Follow up plan: Return if symptoms worsen or fail to improve.   . This visit was completed via MyChart due to the restrictions of the COVID-19 pandemic. All issues as above were discussed and addressed. Physical exam was done as above through visual confirmation on MyChart. If it was felt that the patient should be evaluated in the office, they were directed there. The patient verbally consented to this visit. . Location of the patient: home . Location of the provider: work . Those involved with this call:  . Provider: Park Liter, DO . CMA: Lauretta Grill, RMA . Front Desk/Registration: Don Perking  . Time spent on call: 15 minutes with patient face to face via video conference. More than 50% of this time was spent in counseling and coordination of care. 23 minutes total spent in review of patient's record and preparation of their chart.

## 2020-02-25 NOTE — Patient Instructions (Signed)
Capsaicin gel

## 2020-03-01 ENCOUNTER — Encounter: Payer: Self-pay | Admitting: Family Medicine

## 2020-03-01 NOTE — Assessment & Plan Note (Signed)
Discussed PRN medicine, but with how low she is dropping after chemo, we will hold on restarting her medicine right now to avoid any hypotension. Continue to monitor. Call with any concerns.

## 2020-03-02 ENCOUNTER — Telehealth: Payer: Self-pay | Admitting: Family Medicine

## 2020-03-02 MED ORDER — APIXABAN 5 MG PO TABS: 5.0000 mg | ORAL_TABLET | Freq: Two times a day (BID) | ORAL | 3 refills | Status: DC

## 2020-03-02 NOTE — Telephone Encounter (Signed)
Copied from [place] [id number]. Topic: General - Inquiry >> Mar 02, 2020  2:40 PM Diane Acosta E wrote: Reason for CRM: Pt received a bill for labs saying that the labs were for a new job/ Pts received an invoice for [employer]/ Pt stated she didn't need it for a new job/ pt ws diagnosed with Cancer and has been having blood work done every Tuesday due to her diagnoses / Pt was advised by [facility] to call the office to change the code if done incorrectly /it was listed as a new employee blood work /  Insurance account manager was [phone]/please advise

## 2020-03-03 ENCOUNTER — Other Ambulatory Visit: Payer: Commercial Managed Care - PPO

## 2020-03-03 ENCOUNTER — Other Ambulatory Visit: Payer: Self-pay

## 2020-03-03 DIAGNOSIS — C569 Malignant neoplasm of unspecified ovary: Secondary | ICD-10-CM

## 2020-03-04 LAB — CBC WITH DIFFERENTIAL/PLATELET
Basophils Absolute: 0 10*3/uL (ref 0.0–0.2)
Basos: 0 %
EOS (ABSOLUTE): 0 10*3/uL (ref 0.0–0.4)
Eos: 1 %
Hematocrit: 29.7 % — ABNORMAL LOW (ref 34.0–46.6)
Hemoglobin: 8.9 g/dL — ABNORMAL LOW (ref 11.1–15.9)
Immature Grans (Abs): 0 10*3/uL (ref 0.0–0.1)
Immature Granulocytes: 1 %
Lymphocytes Absolute: 1.6 10*3/uL (ref 0.7–3.1)
Lymphs: 39 %
MCH: 23.4 pg — ABNORMAL LOW (ref 26.6–33.0)
MCHC: 30 g/dL — ABNORMAL LOW (ref 31.5–35.7)
MCV: 78 fL — ABNORMAL LOW (ref 79–97)
Monocytes Absolute: 0.1 10*3/uL (ref 0.1–0.9)
Monocytes: 2 %
NRBC: 1 % — ABNORMAL HIGH (ref 0–0)
Neutrophils Absolute: 2.3 10*3/uL (ref 1.4–7.0)
Neutrophils: 57 %
Platelets: 446 10*3/uL (ref 150–450)
RBC: 3.81 x10E6/uL (ref 3.77–5.28)
RDW: 22.7 % — ABNORMAL HIGH (ref 11.7–15.4)
WBC: 4.1 10*3/uL (ref 3.4–10.8)

## 2020-03-10 ENCOUNTER — Other Ambulatory Visit: Payer: Commercial Managed Care - PPO

## 2020-03-11 ENCOUNTER — Other Ambulatory Visit: Payer: Commercial Managed Care - PPO

## 2020-03-17 ENCOUNTER — Other Ambulatory Visit: Payer: Commercial Managed Care - PPO

## 2020-03-17 ENCOUNTER — Other Ambulatory Visit: Payer: Self-pay

## 2020-03-17 DIAGNOSIS — C569 Malignant neoplasm of unspecified ovary: Secondary | ICD-10-CM

## 2020-03-18 LAB — CBC WITH DIFFERENTIAL/PLATELET
Basophils Absolute: 0 10*3/uL (ref 0.0–0.2)
Basos: 0 %
EOS (ABSOLUTE): 0 10*3/uL (ref 0.0–0.4)
Eos: 0 %
Hematocrit: 26.5 % — ABNORMAL LOW (ref 34.0–46.6)
Hemoglobin: 8.3 g/dL — ABNORMAL LOW (ref 11.1–15.9)
Immature Grans (Abs): 0.1 10*3/uL (ref 0.0–0.1)
Immature Granulocytes: 4 %
Lymphocytes Absolute: 1.3 10*3/uL (ref 0.7–3.1)
Lymphs: 45 %
MCH: 24.5 pg — ABNORMAL LOW (ref 26.6–33.0)
MCHC: 31.3 g/dL — ABNORMAL LOW (ref 31.5–35.7)
MCV: 78 fL — ABNORMAL LOW (ref 79–97)
Monocytes Absolute: 0.1 10*3/uL (ref 0.1–0.9)
Monocytes: 3 %
NRBC: 1 % — ABNORMAL HIGH (ref 0–0)
Neutrophils Absolute: 1.4 10*3/uL (ref 1.4–7.0)
Neutrophils: 48 %
Platelets: 493 10*3/uL — ABNORMAL HIGH (ref 150–450)
RBC: 3.39 x10E6/uL — ABNORMAL LOW (ref 3.77–5.28)
RDW: 21.7 % — ABNORMAL HIGH (ref 11.7–15.4)
WBC: 2.9 10*3/uL — ABNORMAL LOW (ref 3.4–10.8)

## 2020-03-24 ENCOUNTER — Other Ambulatory Visit: Payer: Commercial Managed Care - PPO

## 2020-03-31 ENCOUNTER — Other Ambulatory Visit: Payer: Commercial Managed Care - PPO

## 2020-03-31 ENCOUNTER — Other Ambulatory Visit: Payer: Self-pay

## 2020-03-31 DIAGNOSIS — C569 Malignant neoplasm of unspecified ovary: Secondary | ICD-10-CM

## 2020-04-01 LAB — CBC WITH DIFFERENTIAL/PLATELET
Basophils Absolute: 0 10*3/uL (ref 0.0–0.2)
Basos: 0 %
EOS (ABSOLUTE): 0 10*3/uL (ref 0.0–0.4)
Eos: 0 %
Hematocrit: 27.6 % — ABNORMAL LOW (ref 34.0–46.6)
Hemoglobin: 8.5 g/dL — ABNORMAL LOW (ref 11.1–15.9)
Lymphocytes Absolute: 1.5 10*3/uL (ref 0.7–3.1)
Lymphs: 48 %
MCH: 25.1 pg — ABNORMAL LOW (ref 26.6–33.0)
MCHC: 30.8 g/dL — ABNORMAL LOW (ref 31.5–35.7)
MCV: 81 fL (ref 79–97)
Monocytes Absolute: 0.1 10*3/uL (ref 0.1–0.9)
Monocytes: 3 %
NRBC: 1 % — ABNORMAL HIGH (ref 0–0)
Neutrophils Absolute: 1.6 10*3/uL (ref 1.4–7.0)
Neutrophils: 49 %
Platelets: 468 10*3/uL — ABNORMAL HIGH (ref 150–450)
RBC: 3.39 x10E6/uL — ABNORMAL LOW (ref 3.77–5.28)
RDW: 22.2 % — ABNORMAL HIGH (ref 11.7–15.4)
WBC: 3.2 10*3/uL — ABNORMAL LOW (ref 3.4–10.8)

## 2020-04-02 ENCOUNTER — Other Ambulatory Visit: Payer: Self-pay | Admitting: Family Medicine

## 2020-04-02 DIAGNOSIS — I10 Essential (primary) hypertension: Secondary | ICD-10-CM

## 2020-04-07 ENCOUNTER — Other Ambulatory Visit: Payer: Commercial Managed Care - PPO

## 2020-04-14 ENCOUNTER — Other Ambulatory Visit: Payer: Self-pay

## 2020-04-14 ENCOUNTER — Other Ambulatory Visit: Payer: Commercial Managed Care - PPO

## 2020-04-14 DIAGNOSIS — C569 Malignant neoplasm of unspecified ovary: Secondary | ICD-10-CM

## 2020-04-15 ENCOUNTER — Telehealth: Payer: Self-pay

## 2020-04-15 ENCOUNTER — Other Ambulatory Visit: Payer: Self-pay

## 2020-04-15 DIAGNOSIS — R7989 Other specified abnormal findings of blood chemistry: Secondary | ICD-10-CM

## 2020-04-15 LAB — CBC WITH DIFFERENTIAL/PLATELET
Basophils Absolute: 0 10*3/uL (ref 0.0–0.2)
Basos: 0 %
EOS (ABSOLUTE): 0 10*3/uL (ref 0.0–0.4)
Eos: 0 %
Hematocrit: 29.8 % — ABNORMAL LOW (ref 34.0–46.6)
Hemoglobin: 9.1 g/dL — ABNORMAL LOW (ref 11.1–15.9)
Immature Grans (Abs): 0 10*3/uL (ref 0.0–0.1)
Immature Granulocytes: 1 %
Lymphocytes Absolute: 1.1 10*3/uL (ref 0.7–3.1)
Lymphs: 45 %
MCH: 24.7 pg — ABNORMAL LOW (ref 26.6–33.0)
MCHC: 30.5 g/dL — ABNORMAL LOW (ref 31.5–35.7)
MCV: 81 fL (ref 79–97)
Monocytes Absolute: 0.1 10*3/uL (ref 0.1–0.9)
Monocytes: 3 %
Neutrophils Absolute: 1.2 10*3/uL — ABNORMAL LOW (ref 1.4–7.0)
Neutrophils: 51 %
Platelets: 408 10*3/uL (ref 150–450)
RBC: 3.69 x10E6/uL — ABNORMAL LOW (ref 3.77–5.28)
RDW: 20.5 % — ABNORMAL HIGH (ref 11.7–15.4)
WBC: 2.5 10*3/uL — CL (ref 3.4–10.8)

## 2020-04-15 NOTE — Telephone Encounter (Signed)
Did clinical try to call her?  Copied from [place] [phone]. Topic: General - Other >> Apr 15, 2020 11:28 AM Diane Acosta wrote: Reason for CRM: pt returned the office call. Pt says that she reviewed her results on my-chart but would like to make sure of what the call back was about. Pt says that she has an oncologist already that she sees due to her just beating cancer.   Please call back to make sure of things with pt.

## 2020-04-15 NOTE — Telephone Encounter (Signed)
Pt called in to follow up on billing concern. Pt says that she is aware that Office Admin is helping with this concern. Pt says that she is still receiving bills from [place] meanwhile.   Pt says the bill is for 51.00    Please assist.

## 2020-04-15 NOTE — Progress Notes (Signed)
Error

## 2020-04-15 NOTE — Telephone Encounter (Signed)
Called pt she does see and oncologist already and has an appt. RF was cancelled.  KP

## 2020-04-15 NOTE — Progress Notes (Signed)
Sorry, meant please fax to [facility] oncology group ASAP.  She is followed there and they will want to see this.  Thanks.

## 2020-04-23 ENCOUNTER — Encounter: Payer: Self-pay | Admitting: Family Medicine

## 2020-04-28 ENCOUNTER — Other Ambulatory Visit: Payer: Self-pay

## 2020-04-28 ENCOUNTER — Other Ambulatory Visit: Payer: Commercial Managed Care - PPO

## 2020-04-28 DIAGNOSIS — C569 Malignant neoplasm of unspecified ovary: Secondary | ICD-10-CM

## 2020-04-29 LAB — CBC WITH DIFFERENTIAL/PLATELET
Basophils Absolute: 0 10*3/uL (ref 0.0–0.2)
Basos: 0 %
EOS (ABSOLUTE): 0 10*3/uL (ref 0.0–0.4)
Eos: 0 %
Hematocrit: 29 % — ABNORMAL LOW (ref 34.0–46.6)
Hemoglobin: 9.2 g/dL — ABNORMAL LOW (ref 11.1–15.9)
Immature Grans (Abs): 0.1 10*3/uL (ref 0.0–0.1)
Immature Granulocytes: 2 %
Lymphocytes Absolute: 1.4 10*3/uL (ref 0.7–3.1)
Lymphs: 63 %
MCH: 26.3 pg — ABNORMAL LOW (ref 26.6–33.0)
MCHC: 31.7 g/dL (ref 31.5–35.7)
MCV: 83 fL (ref 79–97)
Monocytes Absolute: 0.1 10*3/uL (ref 0.1–0.9)
Monocytes: 2 %
Neutrophils Absolute: 0.8 10*3/uL — ABNORMAL LOW (ref 1.4–7.0)
Neutrophils: 33 %
Platelets: 480 10*3/uL — ABNORMAL HIGH (ref 150–450)
RBC: 3.5 x10E6/uL — ABNORMAL LOW (ref 3.77–5.28)
RDW: 21 % — ABNORMAL HIGH (ref 11.7–15.4)
WBC: 2.3 10*3/uL — CL (ref 3.4–10.8)

## 2020-04-30 NOTE — Telephone Encounter (Signed)
Spoke with patient and she states billing issue has been resolved. Provided direct dial to call if any billing issues arise with our office in the future.

## 2020-05-07 ENCOUNTER — Telehealth: Payer: Self-pay

## 2020-05-07 NOTE — Telephone Encounter (Signed)
Copied from [id number] [phone]. Topic: General - Inquiry >> May 07, 2020  4:42 PM Gillis Ends D wrote: Reason for CRM: Patient called to let you know that she will not be at her appointment on tomorrow because she is getting admitted into the hospital, concerning the blood clot. She wanted me to let her Doctor know. Please advise

## 2020-05-08 ENCOUNTER — Encounter: Payer: Commercial Managed Care - PPO | Admitting: Family Medicine

## 2020-05-14 DIAGNOSIS — E876 Hypokalemia: Secondary | ICD-10-CM | POA: Insufficient documentation

## 2020-05-30 ENCOUNTER — Encounter: Payer: Self-pay | Admitting: Family Medicine

## 2020-06-08 ENCOUNTER — Encounter: Payer: Self-pay | Admitting: Family Medicine

## 2020-06-08 NOTE — Telephone Encounter (Signed)
Please see about getting patient scheduled.  

## 2020-06-09 ENCOUNTER — Encounter: Payer: Self-pay | Admitting: Family Medicine

## 2020-06-09 ENCOUNTER — Telehealth (INDEPENDENT_AMBULATORY_CARE_PROVIDER_SITE_OTHER): Payer: Commercial Managed Care - PPO | Admitting: Family Medicine

## 2020-06-09 VITALS — BP 130/78 | HR 112

## 2020-06-09 DIAGNOSIS — I871 Compression of vein: Secondary | ICD-10-CM | POA: Diagnosis not present

## 2020-06-09 DIAGNOSIS — I10 Essential (primary) hypertension: Secondary | ICD-10-CM

## 2020-06-09 DIAGNOSIS — R000 Tachycardia, unspecified: Secondary | ICD-10-CM

## 2020-06-09 DIAGNOSIS — C801 Malignant (primary) neoplasm, unspecified: Secondary | ICD-10-CM

## 2020-06-09 DIAGNOSIS — I82B11 Acute embolism and thrombosis of right subclavian vein: Secondary | ICD-10-CM

## 2020-06-09 DIAGNOSIS — C569 Malignant neoplasm of unspecified ovary: Secondary | ICD-10-CM

## 2020-06-09 MED ORDER — METOPROLOL SUCCINATE ER 25 MG PO TB24: 12.5000 mg | ORAL_TABLET | Freq: Every day | ORAL | 3 refills | Status: DC

## 2020-06-09 NOTE — Progress Notes (Signed)
BP 130/78   Pulse (!) 112    Subjective:    Patient ID: Diane Acosta, female    DOB: 30-Nov-1960, 59 y.o.   MRN: [id number]  HPI: Diane Acosta is a 59 y.o. female  Chief Complaint  Patient presents with  . Changing hearrate    patient states sitting heart rate is over 100 and when dhe gets up and does anything it go over 120  . Cancer Side effect   Cancer is a little smaller. She is having a lot of side effects. She feels like her veins are giving out with all the chemo. They are about to start another cycle. She will need to come in start doing blood work again. She's happy that the cancer has shrunk, but she's anxious about what the chemo is going to do.   She is still having the labile BP. She has been watching it. Her HR is always over 100. She is jumping up into the 110-120 with mild exertion and pain. She's concerned about how fast her HR is going.   Her R side if still swollen, but she is having good mobility. She had surgery for SVC syndrome. Her swelling is decreasing, but it's taking a long time. She is otherwise doing OK. No other significant concerns or complaints at this time.   Relevant past medical, surgical, family and social history reviewed and updated as indicated. Interim medical history since our last visit reviewed. Allergies and medications reviewed and updated.  Review of Systems  Constitutional: Positive for fatigue. Negative for activity change, appetite change, chills, diaphoresis, fever and unexpected weight change.  HENT: Negative.   Respiratory: Negative.   Cardiovascular: Positive for palpitations. Negative for chest pain and leg swelling.  Gastrointestinal: Negative.   Neurological: Negative.   Psychiatric/Behavioral: Negative.     Per HPI unless specifically indicated above     Objective:    BP 130/78   Pulse (!) 112   Wt Readings from Last 3 Encounters:  02/07/20 167 lb (75.8 kg)  01/20/20 167 lb (75.8 kg)  10/29/19 166 lb 8  oz (75.5 kg)    Physical Exam Vitals and nursing note reviewed.  Constitutional:      General: She is not in acute distress.    Appearance: Normal appearance. She is not ill-appearing, toxic-appearing or diaphoretic.  HENT:     Head: Normocephalic and atraumatic.     Right Ear: External ear normal.     Left Ear: External ear normal.     Nose: Nose normal.     Mouth/Throat:     Mouth: Mucous membranes are moist.     Pharynx: Oropharynx is clear.  Eyes:     General: No scleral icterus.       Right eye: No discharge.        Left eye: No discharge.     Conjunctiva/sclera: Conjunctivae normal.     Pupils: Pupils are equal, round, and reactive to light.  Pulmonary:     Effort: Pulmonary effort is normal. No respiratory distress.     Comments: Speaking in full sentences Musculoskeletal:        General: Normal range of motion.     Cervical back: Normal range of motion.  Skin:    Coloration: Skin is not jaundiced or pale.     Findings: No bruising, erythema, lesion or rash.  Neurological:     Mental Status: She is alert and oriented to person, place, and time. Mental status is  at baseline.  Psychiatric:        Mood and Affect: Mood normal.        Behavior: Behavior normal.        Thought Content: Thought content normal.        Judgment: Judgment normal.     Results for orders placed or performed in visit on 04/28/20  CBC with Differential/Platelet  Result Value Ref Range   WBC 2.3 (LL) 3.4 - 10.8 x10E3/uL   RBC 3.50 (L) 3.77 - 5.28 x10E6/uL   Hemoglobin 9.2 (L) 11.1 - 15.9 g/dL   Hematocrit 29.0 (L) 34.0 - 46.6 %   MCV 83 79 - 97 fL   MCH 26.3 (L) 26.6 - 33.0 pg   MCHC 31.7 31 - 35 g/dL   RDW 21.0 (H) 11.7 - 15.4 %   Platelets 480 (H) 150 - 450 x10E3/uL   Neutrophils 33 Not Estab. %   Lymphs 63 Not Estab. %   Monocytes 2 Not Estab. %   Eos 0 Not Estab. %   Basos 0 Not Estab. %   Neutrophils Absolute 0.8 (L) 1.40 - 7.00 x10E3/uL   Lymphocytes Absolute 1.4 0 - 3  x10E3/uL   Monocytes Absolute 0.1 0 - 0 x10E3/uL   EOS (ABSOLUTE) 0.0 0.0 - 0.4 x10E3/uL   Basophils Absolute 0.0 0 - 0 x10E3/uL   Immature Granulocytes 2 Not Estab. %   Immature Grans (Abs) 0.1 0.0 - 0.1 x10E3/uL   Hematology Comments: Note:       Assessment & Plan:   Problem List Items Addressed This Visit      Cardiovascular and Mediastinum   Essential hypertension    BP doing OK, but HR has been very fast. Will start metoprolol and recheck in 2-4 weeks. Call with any concerns.       Relevant Medications   metoprolol succinate (TOPROL-XL) 25 MG 24 hr tablet   Right subclavian vein thrombosis ([place])    Following with vascular and oncology. Continue to monitor. Call with any concerns.       Relevant Medications   metoprolol succinate (TOPROL-XL) 25 MG 24 hr tablet   SVC syndrome    Following with vascular and oncology. Continue to monitor. Call with any concerns.       Relevant Medications   metoprolol succinate (TOPROL-XL) 25 MG 24 hr tablet     Endocrine   Ovarian cancer ([place])    To restart chemo next week. Continue to monitor. Call with any concerns.       Ovarian carcinoma, epithelial ([place])    To restart chemo next week. Continue to monitor. Call with any concerns.         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 Continue to monitor. Call with any concerns.        Other Visit Diagnoses    Tachycardia    -  Primary   Will start metoprolol to try to bring her HR down. Recheck in about 2-4 weeks. Continue to monitor. Call with any concerns.        Follow up plan: Return 2-4 weeks.    . This visit was completed via MyChart due to the restrictions of the COVID-19 pandemic. All issues as above were discussed and addressed. Physical exam was done as above through visual confirmation on MyChart. If it was felt that the patient should be evaluated in the office, they were directed there. The patient verbally consented  to this visit. . Location of the patient: home . Location of the provider: work . Those involved with this call:  . Provider: Park Liter, DO . CMA: Frazier Butt, [place] . Front Desk/Registration: Jill Side  . Time spent on call: 23 minutes with patient face to face via video conference. More than 50% of this time was spent in counseling and coordination of care. 40 minutes total spent in review of patient's record and preparation of their chart.

## 2020-06-15 ENCOUNTER — Telehealth: Payer: Commercial Managed Care - PPO | Admitting: Family Medicine

## 2020-06-15 ENCOUNTER — Telehealth: Payer: Self-pay | Admitting: Family Medicine

## 2020-06-15 NOTE — Telephone Encounter (Signed)
Patient is calling to request orders for blood work for her chemo infusions at [facility]  CBC  [facility] Magnesium Phosphorus  Patient would like to come for lab work tomorrow around [employer] or 10am.  Please advise if she can come tomorrow. CB- [phone]

## 2020-06-15 NOTE — Telephone Encounter (Signed)
Lab orders are in already. Called and scheduled appointment for tomorrow with patient.

## 2020-06-16 ENCOUNTER — Other Ambulatory Visit: Payer: Self-pay

## 2020-06-16 ENCOUNTER — Other Ambulatory Visit: Payer: Commercial Managed Care - PPO

## 2020-06-16 DIAGNOSIS — C569 Malignant neoplasm of unspecified ovary: Secondary | ICD-10-CM

## 2020-06-16 NOTE — Assessment & Plan Note (Signed)
Following with vascular and oncology. Continue to monitor. Call with any concerns.

## 2020-06-16 NOTE — Assessment & Plan Note (Signed)
BP doing OK, but HR has been very fast. Will start metoprolol and recheck in 2-4 weeks. Call with any concerns.

## 2020-06-16 NOTE — Assessment & Plan Note (Signed)
To restart chemo next week. Continue to monitor. Call with any concerns.

## 2020-06-17 LAB — CBC WITH DIFFERENTIAL/PLATELET
Basophils Absolute: 0 10*3/uL (ref 0.0–0.2)
Basos: 0 %
EOS (ABSOLUTE): 0 10*3/uL (ref 0.0–0.4)
Eos: 0 %
Hematocrit: 26.7 % — ABNORMAL LOW (ref 34.0–46.6)
Hemoglobin: 8.4 g/dL — ABNORMAL LOW (ref 11.1–15.9)
Immature Grans (Abs): 0 10*3/uL (ref 0.0–0.1)
Immature Granulocytes: 1 %
Lymphocytes Absolute: 1.7 10*3/uL (ref 0.7–3.1)
Lymphs: 27 %
MCH: 24.9 pg — ABNORMAL LOW (ref 26.6–33.0)
MCHC: 31.5 g/dL (ref 31.5–35.7)
MCV: 79 fL (ref 79–97)
Monocytes Absolute: 0.2 10*3/uL (ref 0.1–0.9)
Monocytes: 3 %
Neutrophils Absolute: 4.4 10*3/uL (ref 1.4–7.0)
Neutrophils: 69 %
Platelets: 510 10*3/uL — ABNORMAL HIGH (ref 150–450)
RBC: 3.37 x10E6/uL — ABNORMAL LOW (ref 3.77–5.28)
RDW: 19.8 % — ABNORMAL HIGH (ref 11.7–15.4)
WBC: 6.4 10*3/uL (ref 3.4–10.8)

## 2020-06-30 ENCOUNTER — Other Ambulatory Visit: Payer: Self-pay | Admitting: Family Medicine

## 2020-06-30 ENCOUNTER — Other Ambulatory Visit: Payer: Commercial Managed Care - PPO

## 2020-06-30 ENCOUNTER — Other Ambulatory Visit: Payer: Self-pay

## 2020-06-30 DIAGNOSIS — C569 Malignant neoplasm of unspecified ovary: Secondary | ICD-10-CM

## 2020-07-01 LAB — CBC WITH DIFFERENTIAL/PLATELET
Basophils Absolute: 0 10*3/uL (ref 0.0–0.2)
Basos: 0 %
EOS (ABSOLUTE): 0 10*3/uL (ref 0.0–0.4)
Eos: 0 %
Hematocrit: 26 % — ABNORMAL LOW (ref 34.0–46.6)
Hemoglobin: 7.8 g/dL — ABNORMAL LOW (ref 11.1–15.9)
Immature Grans (Abs): 0.1 10*3/uL (ref 0.0–0.1)
Immature Granulocytes: 2 %
Lymphocytes Absolute: 2 10*3/uL (ref 0.7–3.1)
Lymphs: 56 %
MCH: 23.9 pg — ABNORMAL LOW (ref 26.6–33.0)
MCHC: 30 g/dL — ABNORMAL LOW (ref 31.5–35.7)
MCV: 80 fL (ref 79–97)
Monocytes Absolute: 0.1 10*3/uL (ref 0.1–0.9)
Monocytes: 3 %
Neutrophils Absolute: 1.4 10*3/uL (ref 1.4–7.0)
Neutrophils: 39 %
Platelets: 373 10*3/uL (ref 150–450)
RBC: 3.26 x10E6/uL — ABNORMAL LOW (ref 3.77–5.28)
RDW: 20.4 % — ABNORMAL HIGH (ref 11.7–15.4)
WBC: 3.5 10*3/uL (ref 3.4–10.8)

## 2020-07-02 ENCOUNTER — Encounter: Payer: Self-pay | Admitting: Family Medicine

## 2020-07-06 ENCOUNTER — Other Ambulatory Visit: Payer: Self-pay

## 2020-07-06 ENCOUNTER — Encounter: Payer: Self-pay | Admitting: Family Medicine

## 2020-07-06 ENCOUNTER — Telehealth (INDEPENDENT_AMBULATORY_CARE_PROVIDER_SITE_OTHER): Payer: Commercial Managed Care - PPO | Admitting: Family Medicine

## 2020-07-06 DIAGNOSIS — R000 Tachycardia, unspecified: Secondary | ICD-10-CM | POA: Diagnosis not present

## 2020-07-06 MED ORDER — METOPROLOL SUCCINATE ER 25 MG PO TB24: 12.5000 mg | ORAL_TABLET | Freq: Every day | ORAL | 1 refills | Status: DC

## 2020-07-06 NOTE — Progress Notes (Signed)
There were no vitals taken for this visit.   Subjective:    Patient ID: Diane Acosta, female    DOB: 1960/08/28, 59 y.o.   MRN: [id number]  HPI: Diane Acosta is a 59 y.o. female  Chief Complaint  Patient presents with  . Tachycardia    4 week f/up   Feeling much better. Has not been checking her BP and HR as often.  PALPITATIONS Duration: weeks Symptom description: Duration of episode: hours Frequency: recurrentl Activity when event occurred: at random Related to exertion: no Dyspnea: no Chest pain: no Syncope: no Anxiety/stress: yes Nausea/vomiting: no Diaphoresis: no Coronary artery disease: no Congestive heart failure: no Arrhythmia:no Thyroid disease: no Status:  better  Relevant past medical, surgical, family and social history reviewed and updated as indicated. Interim medical history since our last visit reviewed. Allergies and medications reviewed and updated.  Review of Systems  Constitutional: Negative.   Respiratory: Negative.   Cardiovascular: Negative.   Gastrointestinal: Negative.   Musculoskeletal: Negative.   Psychiatric/Behavioral: Negative.     Per HPI unless specifically indicated above     Objective:    There were no vitals taken for this visit.  Wt Readings from Last 3 Encounters:  02/07/20 167 lb (75.8 kg)  01/20/20 167 lb (75.8 kg)  10/29/19 166 lb 8 oz (75.5 kg)    Physical Exam Vitals and nursing note reviewed.  Pulmonary:     Effort: Pulmonary effort is normal. No respiratory distress.     Comments: Speaking in full sentences Neurological:     Mental Status: She is alert.  Psychiatric:        Mood and Affect: Mood normal.        Behavior: Behavior normal.        Thought Content: Thought content normal.        Judgment: Judgment normal.     Results for orders placed or performed in visit on 06/30/20  CBC with Differential/Platelet  Result Value Ref Range   WBC 3.5 3.4 - 10.8 x10E3/uL   RBC 3.26 (L)  3.77 - 5.28 x10E6/uL   Hemoglobin 7.8 (L) 11.1 - 15.9 g/dL   Hematocrit 10.2 (L) 58.5 - 46.6 %   MCV 80 79 - 97 fL   MCH 23.9 (L) 26.6 - 33.0 pg   MCHC 30.0 (L) 31.5 - 35.7 g/dL   RDW 27.7 (H) 82.4 - 23.5 %   Platelets 373 150 - 450 x10E3/uL   Neutrophils 39 Not Estab. %   Lymphs 56 Not Estab. %   Monocytes 3 Not Estab. %   Eos 0 Not Estab. %   Basos 0 Not Estab. %   Neutrophils Absolute 1.4 1.4 - 7.0 x10E3/uL   Lymphocytes Absolute 2.0 0.7 - 3.1 x10E3/uL   Monocytes Absolute 0.1 0.1 - 0.9 x10E3/uL   EOS (ABSOLUTE) 0.0 0.0 - 0.4 x10E3/uL   Basophils Absolute 0.0 0.0 - 0.2 x10E3/uL   Immature Granulocytes 2 Not Estab. %   Immature Grans (Abs) 0.1 0.0 - 0.1 x10E3/uL      Assessment & Plan:   Problem List Items Addressed This Visit   None   Visit Diagnoses    Tachycardia    -  Primary   Doing much better. HR and BP in normal range at home. Continue metoprolol. Call with any concerns. Refills given today.       Follow up plan: Return if symptoms worsen or fail to improve.   . This visit was completed via  telephone due to the restrictions of the COVID-19 pandemic. All issues as above were discussed and addressed but no physical exam was performed. If it was felt that the patient should be evaluated in the office, they were directed there. The patient verbally consented to this visit. Patient was unable to complete an audio/visual visit due to Lack of equipment. Due to the catastrophic nature of the COVID-19 pandemic, this visit was done through audio contact only. . Location of the patient: parking lot . Location of the provider: work . Those involved with this call:  . Provider: Park Liter, DO . CMA: Yvonna Alanis, [place] . Front Desk/Registration: Barth Kirks  . Time spent on call: 21 minutes on the phone discussing health concerns. 23 minutes total spent in review of patient's record and preparation of their chart.

## 2020-07-07 ENCOUNTER — Ambulatory Visit (INDEPENDENT_AMBULATORY_CARE_PROVIDER_SITE_OTHER): Payer: Commercial Managed Care - PPO

## 2020-07-07 DIAGNOSIS — Z23 Encounter for immunization: Secondary | ICD-10-CM | POA: Diagnosis not present

## 2020-07-14 ENCOUNTER — Other Ambulatory Visit: Payer: Commercial Managed Care - PPO

## 2020-07-14 ENCOUNTER — Other Ambulatory Visit: Payer: Self-pay

## 2020-07-14 DIAGNOSIS — C569 Malignant neoplasm of unspecified ovary: Secondary | ICD-10-CM

## 2020-07-15 LAB — CBC WITH DIFFERENTIAL/PLATELET
Basophils Absolute: 0 10*3/uL (ref 0.0–0.2)
Basos: 0 %
EOS (ABSOLUTE): 0 10*3/uL (ref 0.0–0.4)
Eos: 0 %
Hematocrit: 29.2 % — ABNORMAL LOW (ref 34.0–46.6)
Hemoglobin: 8.9 g/dL — ABNORMAL LOW (ref 11.1–15.9)
Immature Grans (Abs): 0.1 10*3/uL (ref 0.0–0.1)
Immature Granulocytes: 3 %
Lymphocytes Absolute: 1.8 10*3/uL (ref 0.7–3.1)
Lymphs: 56 %
MCH: 24.5 pg — ABNORMAL LOW (ref 26.6–33.0)
MCHC: 30.5 g/dL — ABNORMAL LOW (ref 31.5–35.7)
MCV: 80 fL (ref 79–97)
Monocytes Absolute: 0.1 10*3/uL (ref 0.1–0.9)
Monocytes: 3 %
Neutrophils Absolute: 1.3 10*3/uL — ABNORMAL LOW (ref 1.4–7.0)
Neutrophils: 38 %
Platelets: 424 10*3/uL (ref 150–450)
RBC: 3.64 x10E6/uL — ABNORMAL LOW (ref 3.77–5.28)
RDW: 20.2 % — ABNORMAL HIGH (ref 11.7–15.4)
WBC: 3.3 10*3/uL — ABNORMAL LOW (ref 3.4–10.8)

## 2020-07-28 ENCOUNTER — Ambulatory Visit: Payer: Self-pay

## 2020-07-28 DIAGNOSIS — Z1152 Encounter for screening for COVID-19: Secondary | ICD-10-CM

## 2020-07-29 ENCOUNTER — Ambulatory Visit: Payer: Self-pay

## 2020-07-29 LAB — POC SOFIA 2 FLU + SARS ANTIGEN FIA
Influenza A, POC: NEGATIVE
Influenza B, POC: NEGATIVE
SARS Coronavirus 2 Ag: POSITIVE — AB

## 2020-08-13 ENCOUNTER — Other Ambulatory Visit: Payer: Self-pay

## 2020-08-13 ENCOUNTER — Other Ambulatory Visit: Payer: BC Managed Care – PPO

## 2020-08-13 DIAGNOSIS — C569 Malignant neoplasm of unspecified ovary: Secondary | ICD-10-CM

## 2020-08-14 ENCOUNTER — Encounter: Payer: Self-pay | Admitting: Family Medicine

## 2020-08-14 LAB — CBC WITH DIFFERENTIAL/PLATELET
Basophils Absolute: 0 10*3/uL (ref 0.0–0.2)
Basos: 0 %
EOS (ABSOLUTE): 0.1 10*3/uL (ref 0.0–0.4)
Eos: 3 %
Hematocrit: 27.7 % — ABNORMAL LOW (ref 34.0–46.6)
Hemoglobin: 8.2 g/dL — ABNORMAL LOW (ref 11.1–15.9)
Immature Grans (Abs): 0 10*3/uL (ref 0.0–0.1)
Immature Granulocytes: 1 %
Lymphocytes Absolute: 1.6 10*3/uL (ref 0.7–3.1)
Lymphs: 33 %
MCH: 24.1 pg — ABNORMAL LOW (ref 26.6–33.0)
MCHC: 29.6 g/dL — ABNORMAL LOW (ref 31.5–35.7)
MCV: 82 fL (ref 79–97)
Monocytes Absolute: 1.2 10*3/uL — ABNORMAL HIGH (ref 0.1–0.9)
Monocytes: 24 %
NRBC: 1 % — ABNORMAL HIGH (ref 0–0)
Neutrophils Absolute: 1.9 10*3/uL (ref 1.4–7.0)
Neutrophils: 39 %
Platelets: 567 10*3/uL — ABNORMAL HIGH (ref 150–450)
RBC: 3.4 x10E6/uL — ABNORMAL LOW (ref 3.77–5.28)
RDW: 21.7 % — ABNORMAL HIGH (ref 11.7–15.4)
WBC: 4.8 10*3/uL (ref 3.4–10.8)

## 2020-08-14 LAB — COMPREHENSIVE METABOLIC PANEL
ALT: 27 IU/L (ref 0–32)
AST: 28 IU/L (ref 0–40)
Albumin/Globulin Ratio: 1.4 (ref 1.2–2.2)
Albumin: 3.6 g/dL — ABNORMAL LOW (ref 3.8–4.9)
Alkaline Phosphatase: 105 IU/L (ref 44–121)
BUN/Creatinine Ratio: 10 (ref 9–23)
BUN: 5 mg/dL — ABNORMAL LOW (ref 6–24)
Bilirubin Total: 0.2 mg/dL (ref 0.0–1.2)
CO2: 23 mmol/L (ref 20–29)
Calcium: 8.6 mg/dL — ABNORMAL LOW (ref 8.7–10.2)
Chloride: 99 mmol/L (ref 96–106)
Creatinine, Ser: 0.49 mg/dL — ABNORMAL LOW (ref 0.57–1.00)
GFR calc Af Amer: 123 mL/min/{1.73_m2} (ref >59)
GFR calc non Af Amer: 107 mL/min/{1.73_m2} (ref >59)
Globulin, Total: 2.6 g/dL (ref 1.5–4.5)
Glucose: 91 mg/dL (ref 65–99)
Potassium: 3.8 mmol/L (ref 3.5–5.2)
Sodium: 137 mmol/L (ref 134–144)
Total Protein: 6.2 g/dL (ref 6.0–8.5)

## 2020-08-14 LAB — PHOSPHORUS: Phosphorus: 3.1 mg/dL (ref 3.0–4.3)

## 2020-08-14 LAB — MAGNESIUM: Magnesium: 1.4 mg/dL — ABNORMAL LOW (ref 1.6–2.3)

## 2020-08-17 ENCOUNTER — Encounter: Payer: Self-pay | Admitting: Family Medicine

## 2020-08-18 ENCOUNTER — Encounter: Payer: Self-pay | Admitting: Family Medicine

## 2020-08-18 ENCOUNTER — Telehealth (INDEPENDENT_AMBULATORY_CARE_PROVIDER_SITE_OTHER): Payer: Commercial Managed Care - PPO | Admitting: Family Medicine

## 2020-08-18 VITALS — BP 132/84 | HR 112

## 2020-08-18 DIAGNOSIS — I951 Orthostatic hypotension: Secondary | ICD-10-CM | POA: Diagnosis not present

## 2020-08-18 MED ORDER — METOPROLOL SUCCINATE ER 25 MG PO TB24: 12.5000 mg | ORAL_TABLET | Freq: Two times a day (BID) | ORAL | 1 refills | Status: DC | PRN

## 2020-08-18 NOTE — Assessment & Plan Note (Signed)
Has not been eating or drinking normally since COVID with loss of taste and smell. BP is dropping significantly with changes in position. Likely due in part to dehydration. She is scheduled for chemo on Thursday and is supposed to have a pre-treatment with a bag of saline. We will reach out to her oncology team and see if they would consider giving her extra due to her dehydration and orthostasis. Note to be faxed to oncology team now. Continue to monitor. Call with any concerns.

## 2020-08-18 NOTE — Progress Notes (Signed)
BP 132/84 (Patient Position: Standing)   Pulse (!) 112    Subjective:    Patient ID: Diane Acosta, female    DOB: 1960/12/04, 60 y.o.   MRN: [id number]  HPI: Diane Acosta is a 60 y.o. female  Chief Complaint  Patient presents with  . blood pressure concern    Patient states her bp has about a 16-20 point difference between sitting and standing. BP concerns related to chemo treatments.    Had COVID about 2 weeks ago and was really bad with severe fatigue and body aches. Finally starting to feel a little better  HYPERTENSION- has been taking her metoprolol for her heart racing. She has lost her taste and smell Hypertension status: labile  Satisfied with current treatment? no Duration of hypertension: chronic BP monitoring frequency:  a few times a day BP medication side effects:  no Medication compliance: good compliance Previous BP meds: metoprolol Aspirin: no Recurrent headaches: no Visual changes: no Palpitations: yes Dyspnea: no Chest pain: no Lower extremity edema: no Dizzy/lightheaded: yes  Relevant past medical, surgical, family and social history reviewed and updated as indicated. Interim medical history since our last visit reviewed. Allergies and medications reviewed and updated.  Review of Systems  Constitutional: Negative.   Respiratory: Negative.   Cardiovascular: Negative.   Gastrointestinal: Negative.   Musculoskeletal: Negative.   Neurological: Positive for dizziness and light-headedness. Negative for tremors, seizures, syncope, facial asymmetry, speech difficulty, weakness, numbness and headaches.  Psychiatric/Behavioral: Negative.     Per HPI unless specifically indicated above     Objective:    BP 132/84 (Patient Position: Standing)   Pulse (!) 112   Wt Readings from Last 3 Encounters:  02/07/20 167 lb (75.8 kg)  01/20/20 167 lb (75.8 kg)  10/29/19 166 lb 8 oz (75.5 kg)    Physical Exam Vitals and nursing note reviewed.   Constitutional:      General: She is not in acute distress.    Appearance: Normal appearance. She is not ill-appearing, toxic-appearing or diaphoretic.  HENT:     Head: Normocephalic and atraumatic.     Right Ear: External ear normal.     Left Ear: External ear normal.     Nose: Nose normal.     Mouth/Throat:     Mouth: Mucous membranes are moist.     Pharynx: Oropharynx is clear.  Eyes:     General: No scleral icterus.       Right eye: No discharge.        Left eye: No discharge.     Conjunctiva/sclera: Conjunctivae normal.     Pupils: Pupils are equal, round, and reactive to light.  Pulmonary:     Effort: Pulmonary effort is normal. No respiratory distress.     Comments: Speaking in full sentences Musculoskeletal:        General: Normal range of motion.     Cervical back: Normal range of motion.  Skin:    Coloration: Skin is not jaundiced or pale.     Findings: No bruising, erythema, lesion or rash.  Neurological:     Mental Status: She is alert and oriented to person, place, and time. Mental status is at baseline.  Psychiatric:        Mood and Affect: Mood normal.        Behavior: Behavior normal.        Thought Content: Thought content normal.        Judgment: Judgment normal.  Results for orders placed or performed in visit on 08/13/20  Phosphorus  Result Value Ref Range   Phosphorus 3.1 3.0 - 4.3 mg/dL  Magnesium  Result Value Ref Range   Magnesium 1.4 (L) 1.6 - 2.3 mg/dL  Comprehensive metabolic panel  Result Value Ref Range   Glucose 91 65 - 99 mg/dL   BUN 5 (L) 6 - 24 mg/dL   Creatinine, Ser 0.49 (L) 0.57 - 1.00 mg/dL   GFR calc non Af Amer 107 >59 mL/min/1.73   GFR calc Af Amer 123 >59 mL/min/1.73   BUN/Creatinine Ratio 10 9 - 23   Sodium 137 134 - 144 mmol/L   Potassium 3.8 3.5 - 5.2 mmol/L   Chloride 99 96 - 106 mmol/L   CO2 23 20 - 29 mmol/L   Calcium 8.6 (L) 8.7 - 10.2 mg/dL   Total Protein 6.2 6.0 - 8.5 g/dL   Albumin 3.6 (L) 3.8 - 4.9 g/dL    Globulin, Total 2.6 1.5 - 4.5 g/dL   Albumin/Globulin Ratio 1.4 1.2 - 2.2   Bilirubin Total 0.2 0.0 - 1.2 mg/dL   Alkaline Phosphatase 105 44 - 121 IU/L   AST 28 0 - 40 IU/L   ALT 27 0 - 32 IU/L  CBC with Differential/Platelet  Result Value Ref Range   WBC 4.8 3.4 - 10.8 x10E3/uL   RBC 3.40 (L) 3.77 - 5.28 x10E6/uL   Hemoglobin 8.2 (L) 11.1 - 15.9 g/dL   Hematocrit 27.7 (L) 34.0 - 46.6 %   MCV 82 79 - 97 fL   MCH 24.1 (L) 26.6 - 33.0 pg   MCHC 29.6 (L) 31.5 - 35.7 g/dL   RDW 21.7 (H) 11.7 - 15.4 %   Platelets 567 (H) 150 - 450 x10E3/uL   Neutrophils 39 Not Estab. %   Lymphs 33 Not Estab. %   Monocytes 24 Not Estab. %   Eos 3 Not Estab. %   Basos 0 Not Estab. %   Neutrophils Absolute 1.9 1.4 - 7.0 x10E3/uL   Lymphocytes Absolute 1.6 0.7 - 3.1 x10E3/uL   Monocytes Absolute 1.2 (H) 0.1 - 0.9 x10E3/uL   EOS (ABSOLUTE) 0.1 0.0 - 0.4 x10E3/uL   Basophils Absolute 0.0 0.0 - 0.2 x10E3/uL   Immature Granulocytes 1 Not Estab. %   Immature Grans (Abs) 0.0 0.0 - 0.1 x10E3/uL   NRBC 1 (H) 0 - 0 %   Hematology Comments: Note:       Assessment & Plan:   Problem List Items Addressed This Visit      Cardiovascular and Mediastinum   Orthostatic hypotension - Primary    Has not been eating or drinking normally since COVID with loss of taste and smell. BP is dropping significantly with changes in position. Likely due in part to dehydration. She is scheduled for chemo on Thursday and is supposed to have a pre-treatment with a bag of saline. We will reach out to her oncology team and see if they would consider giving her extra due to her dehydration and orthostasis. Note to be faxed to oncology team now. Continue to monitor. Call with any concerns.       Relevant Medications   metoprolol succinate (TOPROL-XL) 25 MG 24 hr tablet       Follow up plan: Return if symptoms worsen or fail to improve.   . This visit was completed via MyChart due to the restrictions of the COVID-19 pandemic.  All issues as above were discussed and addressed. Physical exam was  done as above through visual confirmation on MyChart. If it was felt that the patient should be evaluated in the office, they were directed there. The patient verbally consented to this visit. . Location of the patient: home . Location of the provider: work . Those involved with this call:  . Provider: Park Liter, DO . CMA: Louanna Raw, [place] . Front Desk/Registration: Jill Side  . Time spent on call: 15 minutes with patient face to face via video conference. More than 50% of this time was spent in counseling and coordination of care. 23 minutes total spent in review of patient's record and preparation of their chart.

## 2020-08-25 ENCOUNTER — Other Ambulatory Visit: Payer: Commercial Managed Care - PPO

## 2020-08-25 ENCOUNTER — Other Ambulatory Visit: Payer: Self-pay

## 2020-08-25 DIAGNOSIS — C569 Malignant neoplasm of unspecified ovary: Secondary | ICD-10-CM

## 2020-08-26 LAB — CBC WITH DIFFERENTIAL/PLATELET
Basophils Absolute: 0 10*3/uL (ref 0.0–0.2)
Basos: 0 %
EOS (ABSOLUTE): 0 10*3/uL (ref 0.0–0.4)
Eos: 0 %
Hematocrit: 32.1 % — ABNORMAL LOW (ref 34.0–46.6)
Hemoglobin: 9.7 g/dL — ABNORMAL LOW (ref 11.1–15.9)
Immature Grans (Abs): 0.1 10*3/uL (ref 0.0–0.1)
Immature Granulocytes: 1 %
Lymphocytes Absolute: 1.4 10*3/uL (ref 0.7–3.1)
Lymphs: 29 %
MCH: 24.4 pg — ABNORMAL LOW (ref 26.6–33.0)
MCHC: 30.2 g/dL — ABNORMAL LOW (ref 31.5–35.7)
MCV: 81 fL (ref 79–97)
Monocytes Absolute: 0.1 10*3/uL (ref 0.1–0.9)
Monocytes: 2 %
Neutrophils Absolute: 3.4 10*3/uL (ref 1.4–7.0)
Neutrophils: 68 %
Platelets: 472 10*3/uL — ABNORMAL HIGH (ref 150–450)
RBC: 3.98 x10E6/uL (ref 3.77–5.28)
RDW: 19.5 % — ABNORMAL HIGH (ref 11.7–15.4)
WBC: 5.1 10*3/uL (ref 3.4–10.8)

## 2020-09-23 ENCOUNTER — Encounter: Payer: Self-pay | Admitting: Family Medicine

## 2020-09-25 ENCOUNTER — Other Ambulatory Visit: Payer: Self-pay

## 2020-09-25 MED ORDER — GABAPENTIN 100 MG PO CAPS: 100.0000 mg | ORAL_CAPSULE | Freq: Every day | ORAL | 0 refills | Status: DC

## 2020-09-29 ENCOUNTER — Encounter: Payer: Self-pay | Admitting: Family Medicine

## 2020-09-29 ENCOUNTER — Other Ambulatory Visit: Payer: Self-pay

## 2020-09-29 ENCOUNTER — Ambulatory Visit (INDEPENDENT_AMBULATORY_CARE_PROVIDER_SITE_OTHER): Payer: Commercial Managed Care - PPO | Admitting: Family Medicine

## 2020-09-29 VITALS — BP 159/84 | HR 109 | Temp 98.5°F | Wt 181.4 lb

## 2020-09-29 DIAGNOSIS — T451X5A Adverse effect of antineoplastic and immunosuppressive drugs, initial encounter: Secondary | ICD-10-CM

## 2020-09-29 DIAGNOSIS — C569 Malignant neoplasm of unspecified ovary: Secondary | ICD-10-CM | POA: Diagnosis not present

## 2020-09-29 DIAGNOSIS — C801 Malignant (primary) neoplasm, unspecified: Secondary | ICD-10-CM

## 2020-09-29 DIAGNOSIS — G620 Drug-induced polyneuropathy: Secondary | ICD-10-CM | POA: Diagnosis not present

## 2020-09-29 DIAGNOSIS — I10 Essential (primary) hypertension: Secondary | ICD-10-CM

## 2020-09-29 DIAGNOSIS — R948 Abnormal results of function studies of other organs and systems: Secondary | ICD-10-CM

## 2020-09-29 MED ORDER — GABAPENTIN 100 MG PO CAPS: 100.0000 mg | ORAL_CAPSULE | Freq: Every day | ORAL | 0 refills | Status: DC

## 2020-09-29 NOTE — Assessment & Plan Note (Signed)
Would like to get a 2nd opinion. Referral generated today. Continue to follow with oncology. Considering further treatment.

## 2020-09-29 NOTE — Assessment & Plan Note (Signed)
On external side of colon- will hold on colonoscopy for now. Continue to monitor.

## 2020-09-29 NOTE — Assessment & Plan Note (Signed)
Acting up some more. Would like to go back on her gabapentin. Refill given today. Call with any concerns. Continue to monitor.

## 2020-09-29 NOTE — Assessment & Plan Note (Signed)
Running a little high, but drops quite low with her treatments. Has been under a lot of stress. Will continue to monitor- if continues to run high, will restart her 1.25mg  amlodipine, but watch for orthostasis closely.

## 2020-09-29 NOTE — Progress Notes (Signed)
BP (!) 159/84   Pulse (!) 109   Temp 98.5 F (36.9 C)   Wt 181 lb 6.4 oz (82.3 kg)   SpO2 100%   BMI 29.50 kg/m    Subjective:    Patient ID: Diane Acosta, female    DOB: January 08, 1961, 60 y.o.   MRN: [id number]  HPI: Diane Acosta is a 60 y.o. female  Chief Complaint  Patient presents with  . PET Scan    Patient would like to discuss her cancer    Has been feeling OK. Had to have 2 pints of blood last week. She is feeling weak in her legs. She goes on Thursday to discuss doing further treatments vs taking a break. The spots are shrinking. She has no spots in her lungs anymore. They are holding off on the colonoscopy due to the fact that her PET scan seems to be indicating that her spots are on the outside of her colon rather than the inside. This round of the chemo has been miserable. She notes that it has nearly killed her. She is having severe joint pain. Has been unsure about the quality of her care. She would like to get a 2nd opinion through [place].   She just got started on gabapentin for her neuropathy from the chemo. She is noticing that it helping. She notes that she is fine when she closes her eyes especially in the shower she gets very dizzy.   HYPERTENSION- BP has been up and down.  Hypertension status: up and down  Satisfied with current treatment? unsure Duration of hypertension: chronic BP monitoring frequency:  a few times a day BP range: 140s-150s.70s-90s BP medication side effects:  no Medication compliance: excellent compliance Previous BP meds: metoprolol, amlodipine Aspirin: no Recurrent headaches: no Visual changes: no Palpitations: no Dyspnea: no Chest pain: no Lower extremity edema: no Dizzy/lightheaded: yes  Relevant past medical, surgical, family and social history reviewed and updated as indicated. Interim medical history since our last visit reviewed. Allergies and medications reviewed and updated.  Review of Systems   Constitutional: Negative.   Respiratory: Negative.   Cardiovascular: Negative.   Gastrointestinal: Negative.   Musculoskeletal: Positive for arthralgias and joint swelling. Negative for back pain, gait problem, myalgias, neck pain and neck stiffness.  Skin: Negative.   Neurological: Positive for dizziness, light-headedness and numbness. Negative for tremors, seizures, syncope, facial asymmetry, speech difficulty, weakness and headaches.  Psychiatric/Behavioral: Negative.     Per HPI unless specifically indicated above     Objective:    BP (!) 159/84   Pulse (!) 109   Temp 98.5 F (36.9 C)   Wt 181 lb 6.4 oz (82.3 kg)   SpO2 100%   BMI 29.50 kg/m   Wt Readings from Last 3 Encounters:  09/29/20 181 lb 6.4 oz (82.3 kg)  02/07/20 167 lb (75.8 kg)  01/20/20 167 lb (75.8 kg)    Physical Exam Vitals and nursing note reviewed.  Constitutional:      General: She is not in acute distress.    Appearance: Normal appearance. She is not ill-appearing, toxic-appearing or diaphoretic.  HENT:     Head: Normocephalic and atraumatic.     Right Ear: External ear normal.     Left Ear: External ear normal.     Nose: Nose normal.     Mouth/Throat:     Mouth: Mucous membranes are moist.     Pharynx: Oropharynx is clear.  Eyes:     General: No  scleral icterus.       Right eye: No discharge.        Left eye: No discharge.     Extraocular Movements: Extraocular movements intact.     Conjunctiva/sclera: Conjunctivae normal.     Pupils: Pupils are equal, round, and reactive to light.  Cardiovascular:     Rate and Rhythm: Normal rate and regular rhythm.     Pulses: Normal pulses.     Heart sounds: Normal heart sounds. No murmur heard. No friction rub. No gallop.   Pulmonary:     Effort: Pulmonary effort is normal. No respiratory distress.     Breath sounds: Normal breath sounds. No stridor. No wheezing, rhonchi or rales.  Chest:     Chest wall: No tenderness.  Musculoskeletal:         General: Normal range of motion.     Cervical back: Normal range of motion and neck supple.  Skin:    General: Skin is warm and dry.     Capillary Refill: Capillary refill takes less than 2 seconds.     Coloration: Skin is not jaundiced or pale.     Findings: No bruising, erythema, lesion or rash.  Neurological:     General: No focal deficit present.     Mental Status: She is alert and oriented to person, place, and time. Mental status is at baseline.  Psychiatric:        Mood and Affect: Mood normal.        Behavior: Behavior normal.        Thought Content: Thought content normal.        Judgment: Judgment normal.     Results for orders placed or performed in visit on 08/25/20  CBC with Differential/Platelet  Result Value Ref Range   WBC 5.1 3.4 - 10.8 x10E3/uL   RBC 3.98 3.77 - 5.28 x10E6/uL   Hemoglobin 9.7 (L) 11.1 - 15.9 g/dL   Hematocrit 32.1 (L) 34.0 - 46.6 %   MCV 81 79 - 97 fL   MCH 24.4 (L) 26.6 - 33.0 pg   MCHC 30.2 (L) 31.5 - 35.7 g/dL   RDW 19.5 (H) 11.7 - 15.4 %   Platelets 472 (H) 150 - 450 x10E3/uL   Neutrophils 68 Not Estab. %   Lymphs 29 Not Estab. %   Monocytes 2 Not Estab. %   Eos 0 Not Estab. %   Basos 0 Not Estab. %   Neutrophils Absolute 3.4 1.4 - 7.0 x10E3/uL   Lymphocytes Absolute 1.4 0.7 - 3.1 x10E3/uL   Monocytes Absolute 0.1 0.1 - 0.9 x10E3/uL   EOS (ABSOLUTE) 0.0 0.0 - 0.4 x10E3/uL   Basophils Absolute 0.0 0.0 - 0.2 x10E3/uL   Immature Granulocytes 1 Not Estab. %   Immature Grans (Abs) 0.1 0.0 - 0.1 x10E3/uL      Assessment & Plan:   Problem List Items Addressed This Visit      Cardiovascular and Mediastinum   Essential hypertension    Running a little high, but drops quite low with her treatments. Has been under a lot of stress. Will continue to monitor- if continues to run high, will restart her 1.25mg  amlodipine, but watch for orthostasis closely.        Endocrine   Ovarian cancer [facility]) - Primary    Would like to get a 2nd  opinion. Referral generated today. Continue to follow with oncology. Considering further treatment.       Relevant Orders   Ambulatory referral to  Oncology   Ovarian carcinoma, epithelial ([place])    Would like to get a 2nd opinion. Referral generated today. Continue to follow with oncology. Considering further treatment.       Relevant Orders   Ambulatory referral to Oncology     Nervous and Auditory   Peripheral neuropathy due to chemotherapy [facility])    Acting up some more. Would like to go back on her gabapentin. Refill given today. Call with any concerns. Continue to monitor.       Relevant Medications   gabapentin (NEURONTIN) 100 MG capsule     Other   Abnormal positron emission tomography (PET) of colon    On external side of colon- will hold on colonoscopy for now. Continue to monitor.       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 Referral generated today. Continue to follow with oncology. Considering further treatment.       Relevant Orders   Ambulatory referral to Oncology       Follow up plan: Return in about 3 months (around 12/30/2020).  >40 minutes spent with patient today

## 2020-09-30 ENCOUNTER — Encounter: Payer: Self-pay | Admitting: Family Medicine

## 2020-10-01 MED ORDER — APIXABAN 5 MG PO TABS: ORAL_TABLET | ORAL | 1 refills | Status: DC

## 2020-10-07 ENCOUNTER — Encounter: Payer: Self-pay | Admitting: Family Medicine

## 2020-10-08 ENCOUNTER — Telehealth: Payer: Self-pay

## 2020-10-08 NOTE — Telephone Encounter (Signed)
Received referral from Dr. Wynetta Emery. Records reviewed. Contacted Diane Acosta and she has requested to see Dr. Theora Gianotti. Dr. Theora Gianotti is in clinic at [facility] on 10/21/20. Appointment has been arranged for 0930. Directions to the cancer center given. Went over what to expect during the consult. All questions answered.

## 2020-10-12 IMAGING — US US EXTREM  UP VENOUS*R*
1 series · 13 of 24 positions shown · non-contrast
Comparison: None.

CLINICAL DATA: Right arm swelling



[Series 1: us upper extremity duplex right (non-wbi) · 13 of 36 slices shown]
[im 1/36]
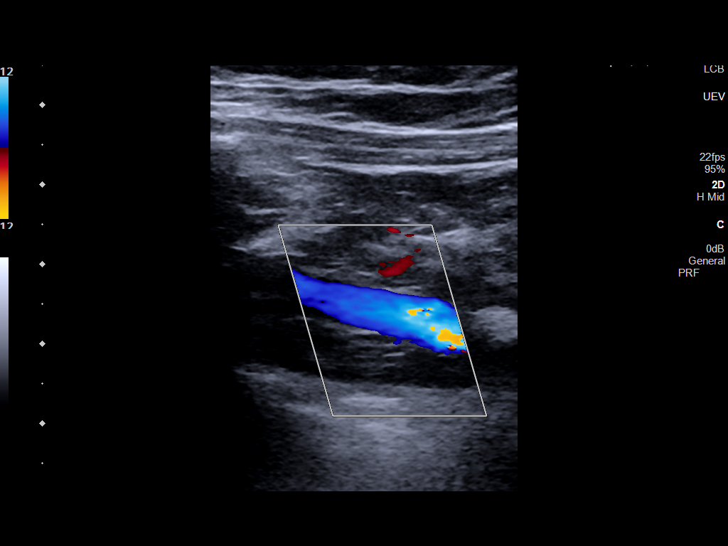
[im 4/36]
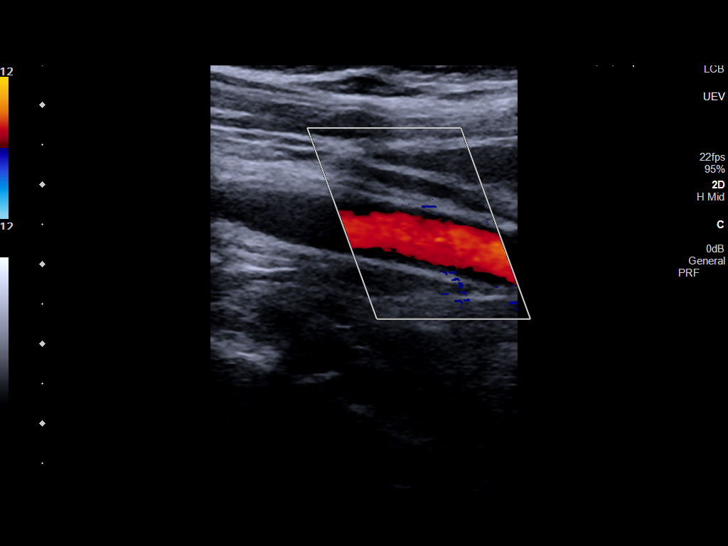
[im 7/36]
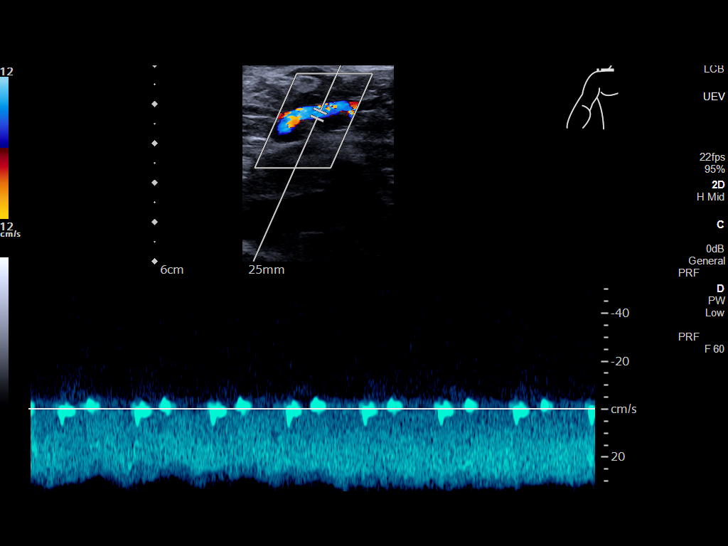
[im 10/36]
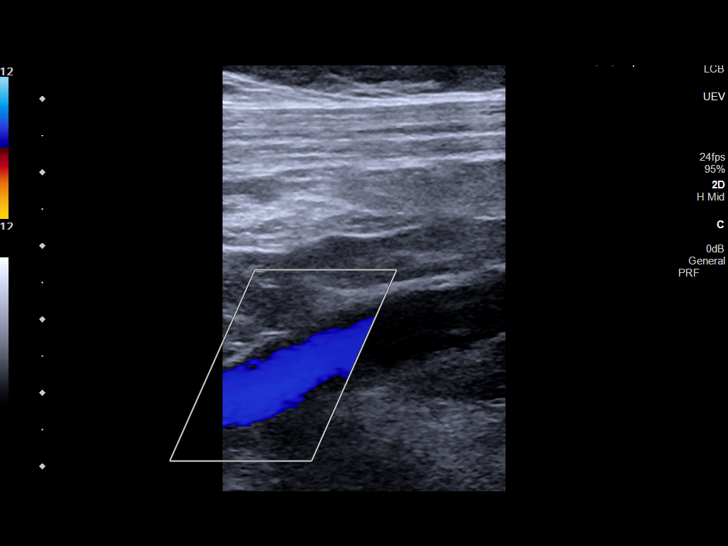
[im 13/36]
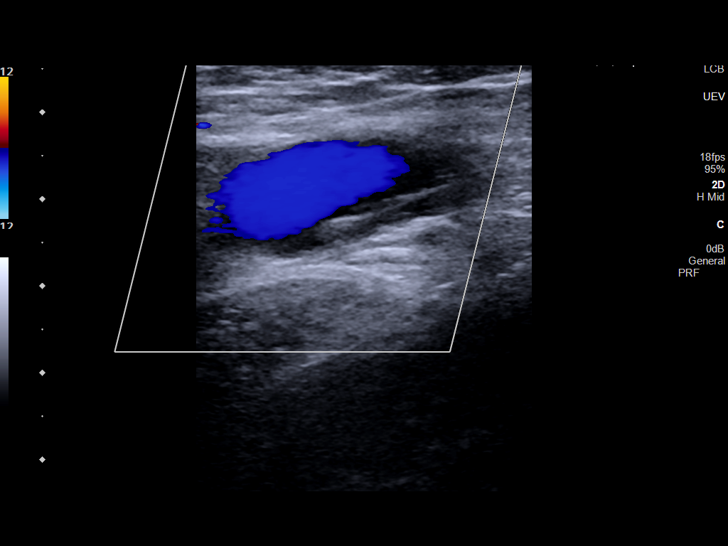
[im 16/36]
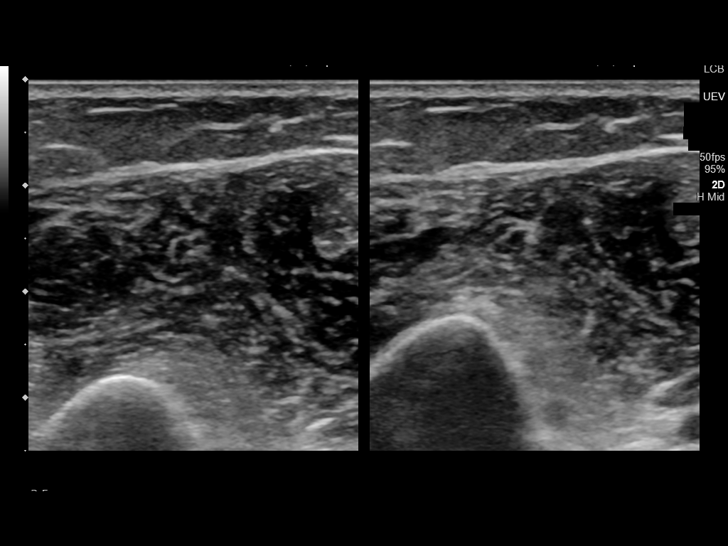
[im 19/36]
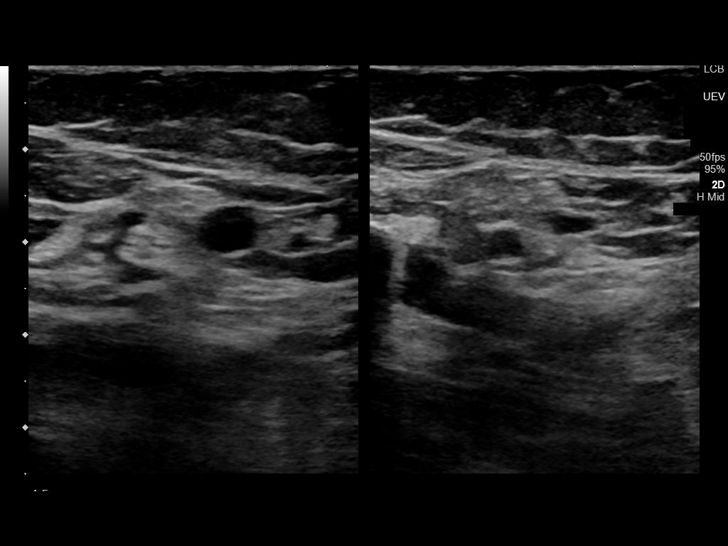
[im 20/36]
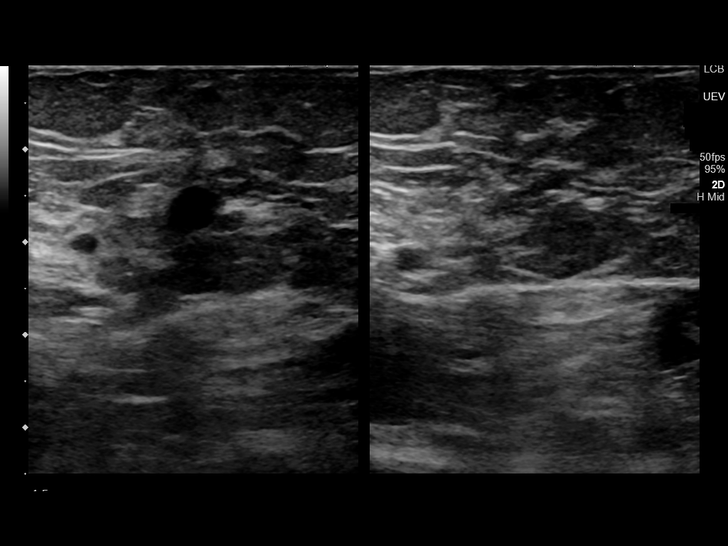
[im 23/36]
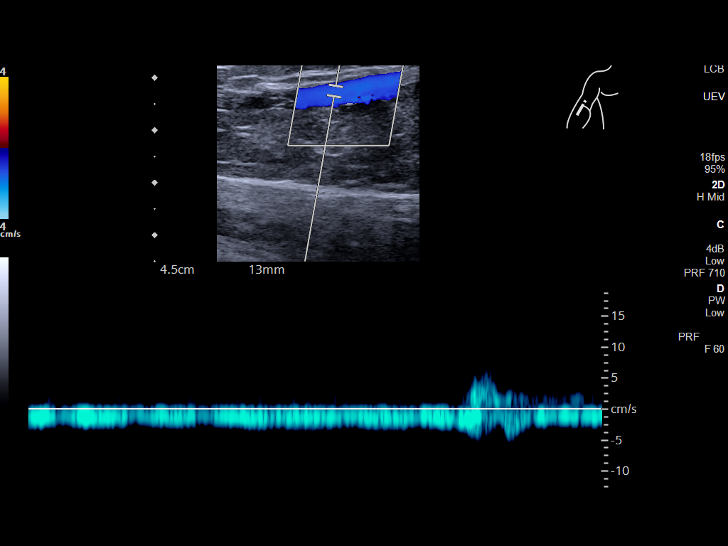
[im 26/36]
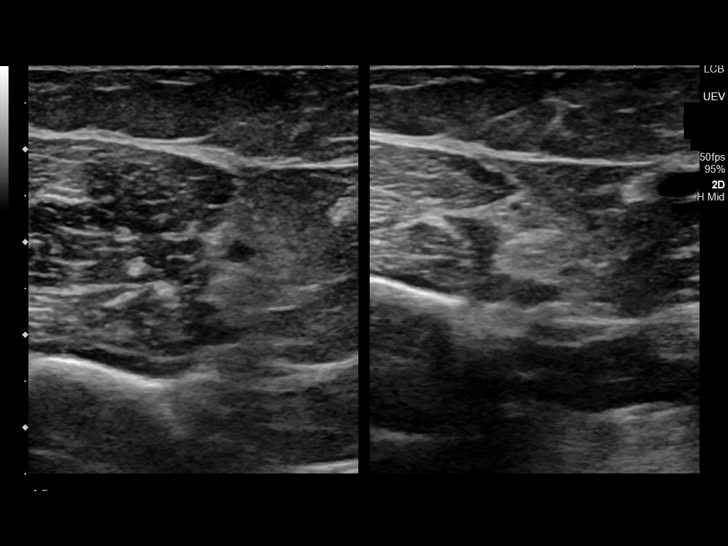
[im 29/36]
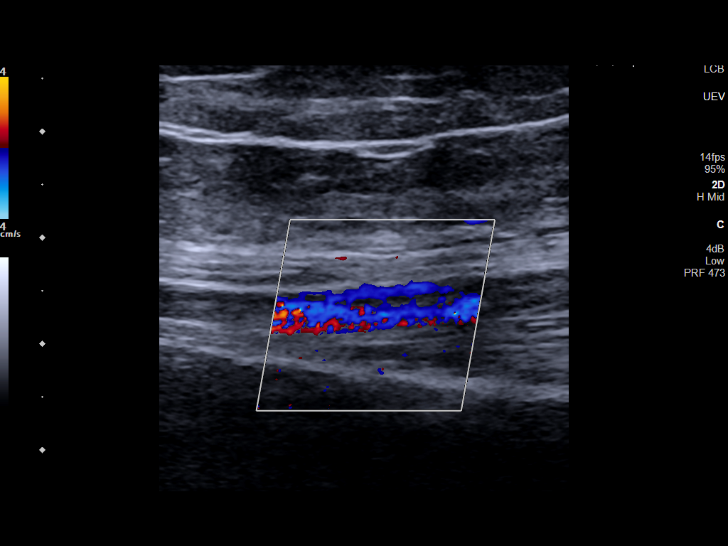
[im 32/36]
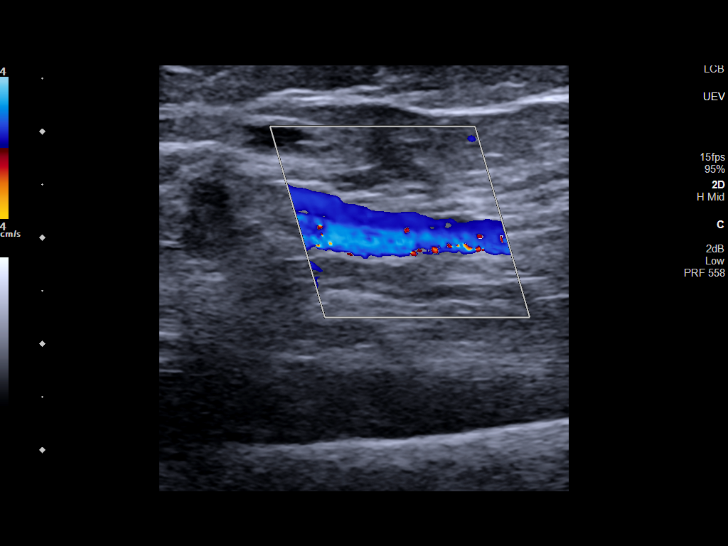
[im 36/36]
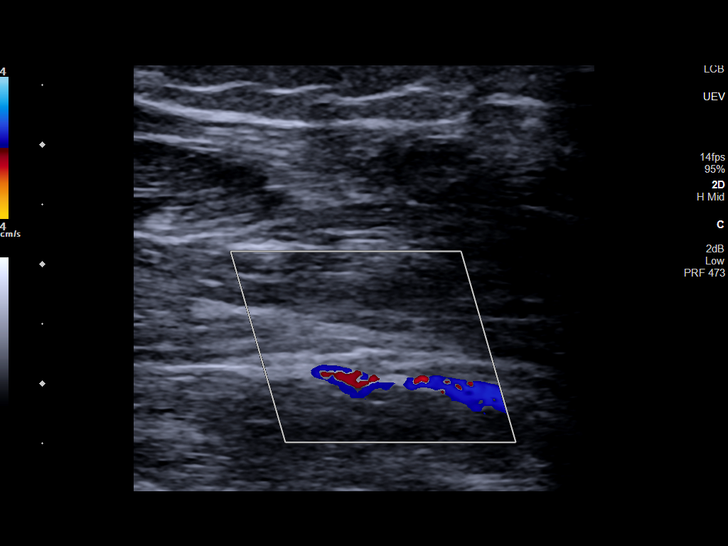

[13 of 24 positions shown; findings below may reference images not displayed]

FINDINGS: Contralateral Subclavian Vein: Respiratory phasicity is normal and
symmetric with the symptomatic side. No evidence of thrombus. Normal
compressibility.

Internal Jugular Vein: No evidence of thrombus. Normal
compressibility, respiratory phasicity and response to augmentation.

Subclavian Vein: No evidence of thrombus. Normal compressibility,
respiratory phasicity and response to augmentation.

Axillary Vein: No evidence of thrombus. Normal compressibility,
respiratory phasicity and response to augmentation.

Cephalic Vein: No evidence of thrombus. Normal compressibility,
respiratory phasicity and response to augmentation.

Basilic Vein: No evidence of thrombus. Normal compressibility,
respiratory phasicity and response to augmentation.

Brachial Veins: No evidence of thrombus. Normal compressibility,
respiratory phasicity and response to augmentation.

Radial Veins: No evidence of thrombus. Normal compressibility,
respiratory phasicity and response to augmentation.

Ulnar Veins: No evidence of thrombus. Normal compressibility,
respiratory phasicity and response to augmentation.

Venous Reflux:  None visualized.

Other Findings:  None visualized.
IMPRESSION: No evidence of DVT within the right upper extremity.

## 2020-10-18 ENCOUNTER — Encounter: Payer: Self-pay | Admitting: Family Medicine

## 2020-10-21 ENCOUNTER — Other Ambulatory Visit: Payer: Self-pay

## 2020-10-21 ENCOUNTER — Inpatient Hospital Stay: Payer: Commercial Managed Care - PPO | Attending: Obstetrics and Gynecology | Admitting: Obstetrics and Gynecology

## 2020-10-21 VITALS — BP 149/79 | HR 93 | Temp 98.6°F | Resp 18 | Wt 172.5 lb

## 2020-10-21 DIAGNOSIS — C569 Malignant neoplasm of unspecified ovary: Secondary | ICD-10-CM | POA: Insufficient documentation

## 2020-10-21 DIAGNOSIS — Z7901 Long term (current) use of anticoagulants: Secondary | ICD-10-CM | POA: Diagnosis not present

## 2020-10-21 DIAGNOSIS — I951 Orthostatic hypotension: Secondary | ICD-10-CM | POA: Insufficient documentation

## 2020-10-21 DIAGNOSIS — E7800 Pure hypercholesterolemia, unspecified: Secondary | ICD-10-CM | POA: Diagnosis not present

## 2020-10-21 DIAGNOSIS — I10 Essential (primary) hypertension: Secondary | ICD-10-CM | POA: Insufficient documentation

## 2020-10-21 DIAGNOSIS — Z923 Personal history of irradiation: Secondary | ICD-10-CM | POA: Diagnosis not present

## 2020-10-21 DIAGNOSIS — C7989 Secondary malignant neoplasm of other specified sites: Secondary | ICD-10-CM | POA: Insufficient documentation

## 2020-10-21 DIAGNOSIS — Z86718 Personal history of other venous thrombosis and embolism: Secondary | ICD-10-CM | POA: Diagnosis not present

## 2020-10-21 NOTE — Progress Notes (Signed)
Gynecologic Oncology Consult Visit   Referring Provider: Primary Gynecologic Oncologist: Dr. Suzan Slick  Chief Concern: Second opinion for ovarian cancer  Subjective:  Diane Acosta is a 60 y.o. G2P2 female who is seen in consultation from Dr. Colonel Bald for second opinion for management of recurrent stage IC endometrioid ovarian cancer. She presents to clinic with her husband, Diala Waxman, of ~40 years. They are highschool sweethearts.   She initially presented with abdominal bloating and the findings of a 23 x 13 x 24 cm solid and cystic pelvic mass. She underwent exploratory laparotomy February 02, 2010. She is found to have a endometrioid ovarian cancer. Complete surgical staging was performed ultimately staged as a stage IC grade 1 endometrioid ovarian cancer associated with endometriosis. She received 3 cycles of carboplatin and Taxol postoperatively completed April 08, 2010.  She was followed free of disease until CA-125 began to rise in September 2015. In October 2015 a CT scan showed recurrent disease predominantly in the pelvis and along the right psoas muscle. This is confirmed by fine needle aspirate.  The patient underwent secondary cytoreductive surgery on May 28, 2014. All gross tumor was resected. Final pathology showed an endometrioid adenocarcinoma with squamous differentiation 1 specimen was poorly differentiated.  She initiated second line chemotherapy using carboplatin and Avastin and Taxol. After 6 cycles her CA-125 was 2.2 CT scan was negative (October 28, 2014).She continued Avastin therapy. Avastin was completed in March 2017.  In January 2018 the patient's CA-125 rose slightly (13 as per mL) and on imaging noted to have her right pelvic sidewall as well as sigmoid colon. This was treated with whole pelvis radiation therapy to a dose of 50.4 Gy. Radiation was completed in June 2018.  CA-125 bumped to 11.8 in March 2021. PET scan was performed consistent with  recurrent disease.  10/2019 PET  IMPRESSION: - Intensely avid soft tissue nodule in the left pelvis along the psoas muscle concerning for a hypermetabolic retroperitoneal lymph node and metastatic disease.   -New subcentimeter pulmonary nodules with the larger left upper lobe pulmonary nodule demonstrating mild uptake concerning for metastatic disease.  - Focal uptake in the sigmoid colon corresponding to ill-defined soft tissue thickening. Recommend further evaluation with colonoscopy.  Patient received 3 cycles of cisplatin/gemzar - follow-up PET scan showed:   02/01/2020 During treatment, patient was diagnosed with a right subclavian vein thrombosis and is currently on eliquis. She has required Port removal and then replacement and had stents placed in vessels.   02/26/2020 PET IMPRESSION: - Left pelvic hypermetabolic soft tissue nodule is unchanged, concerning for metastatic disease.   - Left upper lobe pulmonary nodule with mild FDG uptake is also unchanged, concerning for metastases disease.   - Unchanged focal uptake in the sigmoid colon corresponding to ill-defined soft tissue thickening. Recommend further evaluation with colonoscopy.   02/26/2020 Chest/A/P CT -Interval decreased conspicuity of the previously noted thrombus along the distal right subclavian vein, difficult to assess due to background motion and streak artifacts.   -Previously described hypodensity at the right subclavian vein is not well assessed due to timing of contrast injection.   -No new or enlarging sites of metastatic disease.  -Left pelvic soft tissue nodule/implant is slightly decreased in size. This lesion demonstrates radiotracer uptake on concurrent PET.  -Wall thickening of distal ileal bowel loops, unchanged from several prior exams and may represent sequelae of radiation  -No focal areas of wall thickening or inflammatory changes within the colon to correlate with foci of  uptake on concurrent  current PET.  -Refer to same-day CT chest for evaluation above the diaphragm.  05/09/2020 Chest CT  Resolution of right lower lobe pulmonary nodule and interval decrease in size of subpleural left upper lobe nodule.   05/26/2020 PET/CT  - Interval slight decrease in the size and FDG uptake of the hypermetabolic soft tissue nodule which remains persistently hypermetabolic. Still concerning for metastatic disease.   -Interval decrease in size and FDG uptake of the persistently hypermetabolic focal thickening in the sigmoid colon. This still remains suspicious for potential metastatic disease.   -Interval decrease in the size and FDG uptake of these pleural nodule in the left upper lobe and interval resolution of the non-FDG avid pleural nodule in the right lower lobe.   09/21/2020 PET/CT  Lungs and Pleura: Stable non-FDG avid left upper lobe subpleural micronodule, too small for PET. No pleural effusion. Bibasilar subsegmental atelectasis. Mild bilateral posterior pleural thickening.   Liver: A few small subcentimeter foci of increased FDG activity within the right hepatic lobe without corresponding CT abnormality (for reference: CT 153, 157 and 160).   Mildly decreased FDG uptake of the soft tissue nodule adjacent to the left psoas muscle measuring 1.5 cm, previously 1.9 cm (CT 222). Stable 2.5 cm non-FDG avid right lower quadrant fluid-filled collection with peripheral calcification (CT 237), likely a small seroma or lymphocele.   Interval mild decrease in size and FDG uptake of the soft tissue nodule adjacent to left psoas muscle.   -Continued interval decrease in FDG uptake involving the focally thickened sigmoid colon. This is indeterminate but less suspicious for metastaticdisease/malignancy given interval improvement.   -Multiple subcentimeter foci of indeterminate FDG activity within the right hepatic lobe without corresponding CT abnormality. Indeterminate. Attention on short-term  follow-up PET/CT (12 weeks).  She has received 9 cycles of cisplatin/gemzar with response to treatment - decrease in activity and size of sidewall and colon nodule, resolution of disease in lung, new concern for disease in liver but there is not definitive evidence of disease. Dr. Colonel Bald counseled her about options for treatment included continued gemzar/cisplatin, cisplatin only versus break from treatment. She has required multiple transfusions (6x) since 06/11/2020 for anemia. Other side effects include neuropathy (she had dropped glasses and has imbalance); fatigue requiring a steroid taper, weakness, and labile BPs (which I suspect is due to anemia).   She opted to come off combination gem/cisplatin therapy and continue with single agent cisplatin alone with plan to repeat PET imaging after 3 cycles of treatment.   10/15/2020 cycle #1 of cisplatin.    Patient's treatment had been delayed due to COVID-19 infection - patient reports that she finally feels recovered from her COVID-19 infection. Joint pain has been her primary concern during treatment - patient uses tramadol and has been written for oxycodone. We also discuss CBD-related options.   PET scan after 9 cycles of treatment showed:   Tumor testing: Tumor mutation profile was MSS, +CTNNB1 and ARID1A mutation based on [facility] in 2015.   Problem List: Patient Active Problem List   Diagnosis Date Noted  . Orthostatic hypotension 08/18/2020  . Hypokalemia 05/14/2020  . Right subclavian vein thrombosis ([place]) 02/10/2020  . Thyroid nodule 02/10/2020  . SVC syndrome 02/01/2020  . Hypomagnesemia 11/27/2019  . 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 Abnormal positron emission tomography (PET) of colon 09/24/2016  . Carotid stenosis 05/05/2016  . Hypercholesterolemia 04/28/2016  . Atherosclerosis of right carotid artery 03/28/2016  . Peripheral neuropathy due  to chemotherapy ([place]) 07/14/2015  . Malignant  neoplasm of ovary ([place]) 01/07/2015  . Asthma 01/07/2015  . Essential hypertension 08/21/2014  . Ovarian carcinoma, epithelial ([place]) 01/09/2013    Past Medical History: Past Medical History:  Diagnosis Date  . Asthma   . Blood clot in vein     Past Surgical History: Past Surgical History:  Procedure Laterality Date  . ABDOMINAL HYSTERECTOMY    . APPENDECTOMY      Past Gynecologic History:  As per HPI  OB History:  OB History  No obstetric history on file.    Family History: Family History  Problem Relation Age of Onset  . Thyroid disease Mother   . Mental illness Mother   . Diverticulitis Mother   . Diverticulitis Father   . Heart disease Father   . Hypertension Father   . Stroke Paternal Grandmother   . Stroke Paternal Grandfather     Social History: Social History   Socioeconomic History  . Marital status: Married    Spouse name: Not on file  . Number of children: 2  . Years of education: Not on file  . Highest education level: Not on file  Occupational History  . Not on file  Tobacco Use  . Smoking status: Never Smoker  . Smokeless tobacco: Never Used  Vaping Use  . Vaping Use: Never used  Substance and Sexual Activity  . Alcohol use: Yes    Alcohol/week: 0.0 standard drinks    Comment: rare  . Drug use: No  . Sexual activity: Not on file  Other Topics Concern  . Not on file  Social History Narrative  . Not on file   Social Determinants of Health   Financial Resource Strain: Not on file  Food Insecurity: Not on file  Transportation Needs: Not on file  Physical Activity: Not on file  Stress: Not on file  Social Connections: Not on file  Intimate Partner Violence: Not on file    Allergies: Allergies  Allergen Reactions  . Hydralazine Hcl Anaphylaxis  . Levofloxacin     Other reaction(s): Other (See Comments) Hypertensive emergency. Other reaction(s): Other (See Comments) Reaction: Hypertensive emergency.    Current  Medications: Current Outpatient Medications  Medication Sig Dispense Refill  . apixaban (ELIQUIS) 5 MG TABS tablet TAKE 1 TABLET(5 MG) BY MOUTH TWICE DAILY 180 tablet 1  . aspirin 81 MG chewable tablet Chew 1 tablet (81 mg total) by mouth daily. 90 tablet 4  . dexamethasone (DECADRON) 2 MG tablet Take 1 tablet 9 time over 2 weeks as directed 45 tablet 3  . dexamethasone (DECADRON) 4 MG tablet     . gabapentin (NEURONTIN) 100 MG capsule Take 1-3 capsules (100-300 mg total) by mouth at bedtime. 270 capsule 0  . metoprolol succinate (TOPROL-XL) 25 MG 24 hr tablet Take 0.5-1 tablets (12.5-25 mg total) by mouth 2 (two) times daily as needed. 180 tablet 1  . lidocaine-prilocaine (EMLA) cream Apply to affected area once (Patient not taking: Reported on 10/21/2020)    . Loratadine (CLARITIN PO) Take by mouth.  (Patient not taking: Reported on 10/21/2020)    . LORazepam (ATIVAN) 0.5 MG tablet SMARTSIG:1-2 Tablet(s) By Mouth PRN (Patient not taking: Reported on 09/29/2020)    . ondansetron (ZOFRAN) 8 MG tablet Take 8 mg by mouth 3 (three) times daily.  (Patient not taking: Reported on 10/21/2020)    . oxyCODONE (OXY IR/ROXICODONE) 5 MG immediate release tablet Take by mouth. (Patient not taking: No sig reported)    .  SUMAtriptan (IMITREX) 20 MG/ACT nasal spray Place 1 spray (20 mg total) into the nose every 2 (two) hours as needed for migraine or headache. May repeat in 2 hours if headache persists or recurs. (Patient not taking: Reported on 10/21/2020) 2 Inhaler 12   No current facility-administered medications for this visit.    Review of Systems General: weakness o/w negative for fevers, changes in weight or night sweats Skin: negative for changes in moles or sores or rash Eyes: negative for changes in vision HEENT: negative for change in hearing, tinnitus, voice changes Pulmonary: negative for dyspnea, orthopnea, productive cough, wheezing Cardiac: negative for palpitations, pain Gastrointestinal:  negative for nausea, vomiting, constipation, diarrhea, hematemesis, hematochezia Genitourinary/Sexual: negative for dysuria, retention, hematuria, incontinence Ob/Gyn:  negative for abnormal bleeding, or pain Musculoskeletal: negative for pain, joint pain, back pain Hematology: negative for easy bruising, abnormal bleeding Neurologic/Psych: as noted per HPI  Objective:  Physical Examination:  BP (!) 149/79   Pulse 93   Temp 98.6 F (37 C)   Resp 18   Wt 172 lb 8 oz (78.2 kg)   SpO2 100%   BMI 28.06 kg/m    ECOG Performance Status: 1 - Symptomatic but completely ambulatory  GENERAL: Patient is a well appearing female in no acute distress HEENT:  PERRL, neck supple with midline trachea. T NODES:  No cervical, supraclavicular, axillary, or inguinal lymphadenopathy palpated.  ABDOMEN:  Soft, nontender, nondistended. No ascites or masses.   MSK:  No focal spinal tenderness to palpation. Full range of motion bilaterally in the upper extremities. EXTREMITIES:  No peripheral edema.   SKIN:  Clear with no obvious rashes or skin changes. No nail dyscrasia. NEURO:  Nonfocal. Well oriented.  Appropriate affect.  Pelvic: declined   Lab Review Labs on site today:        Radiologic Imaging: As per HPI    Assessment:  Itzel Mckibbin is a 60 y.o. female diagnosed with platinum-sensitive recurrent stage IC endometrioid ovarian cancer s/p cisplatin/gemcitabine currently on single agent cisplatin due to toxicity. Recent PET/CT reassuring but few small subcentimeter FDG avid areas in the liver without corresponding areas on the CT scan.     Toxicity due to chemotherapy includes anemia, asthenia, and peripheral neuropathy.   Medical co-morbidities complicating care: prior abdominal surgery, VTE.  Plan:   Problem List Items Addressed This Visit      Endocrine   Malignant neoplasm of ovary ([place]) - Primary      We discussed options for management including continuing cisplatin  versus observation versus PARP inhibitor. I do not know if she has had HRD testing. She has had Foundation testing and LOH is sometimes reported.   Continued close follow up with Dr. Colonel Bald. Subsequent imaging will determine if she has platinum-resistant vs platinum-sensitive disease. If she has platinum-sensitive disease she may be eligible for the [facility] study with mirvetuximab; if platinum-resistant we anticipate that mirvetuximab will be approved by the FDA for this indication later this year. Other standard of care options are also available.   We reviewed that her cancer is important goals of care. We discussed enhanced duration of disease control, but that does not always translated to improved overall survival. She has minimal if any cancer-related symptoms, but does have treatment related symptoms due to toxicity that are interfering with quality of life. She will continue to think about her goals of care.   The patient's diagnosis, an outline of the further diagnostic and laboratory studies which will be required,  the recommendation, and alternatives were discussed.  All questions were answered to the patient's satisfaction.  Tyshaun Vinzant Gaetana Michaelis, MD

## 2020-12-21 ENCOUNTER — Other Ambulatory Visit: Payer: Self-pay | Admitting: Internal Medicine

## 2020-12-22 ENCOUNTER — Encounter: Payer: Self-pay | Admitting: Family Medicine

## 2020-12-23 NOTE — Telephone Encounter (Signed)
Patient has made an additional call requesting a response from a member of clinical staff when possible   Please contact to further advise

## 2020-12-25 NOTE — Telephone Encounter (Signed)
Called pt to schedule virtual appt for today no answer left vm

## 2020-12-29 NOTE — Telephone Encounter (Signed)
Pt has apt on 12/30/2020

## 2020-12-30 ENCOUNTER — Ambulatory Visit (INDEPENDENT_AMBULATORY_CARE_PROVIDER_SITE_OTHER): Payer: Commercial Managed Care - PPO | Admitting: Family Medicine

## 2020-12-30 ENCOUNTER — Encounter: Payer: Self-pay | Admitting: Family Medicine

## 2020-12-30 ENCOUNTER — Other Ambulatory Visit: Payer: Self-pay

## 2020-12-30 VITALS — BP 135/77 | HR 93 | Temp 97.9°F | Ht 65.5 in | Wt 174.4 lb

## 2020-12-30 DIAGNOSIS — S70361A Insect bite (nonvenomous), right thigh, initial encounter: Secondary | ICD-10-CM

## 2020-12-30 DIAGNOSIS — I6521 Occlusion and stenosis of right carotid artery: Secondary | ICD-10-CM

## 2020-12-30 DIAGNOSIS — I10 Essential (primary) hypertension: Secondary | ICD-10-CM

## 2020-12-30 DIAGNOSIS — C569 Malignant neoplasm of unspecified ovary: Secondary | ICD-10-CM

## 2020-12-30 DIAGNOSIS — Z1231 Encounter for screening mammogram for malignant neoplasm of breast: Secondary | ICD-10-CM

## 2020-12-30 DIAGNOSIS — W57XXXA Bitten or stung by nonvenomous insect and other nonvenomous arthropods, initial encounter: Secondary | ICD-10-CM

## 2020-12-30 MED ORDER — EPINEPHRINE 0.3 MG/0.3ML IJ SOAJ: 0.3000 mg | INTRAMUSCULAR | 12 refills | Status: DC | PRN

## 2020-12-30 MED ORDER — ELIQUIS 5 MG PO TABS: ORAL_TABLET | ORAL | 3 refills | Status: DC

## 2020-12-30 MED ORDER — DOXYCYCLINE HYCLATE 100 MG PO TABS: ORAL_TABLET | ORAL | 0 refills | Status: DC

## 2020-12-30 NOTE — Progress Notes (Signed)
BP 135/77   Pulse 93   Temp 97.9 F (36.6 C)   Ht 5' 5.5" (1.664 m)   Wt 174 lb 6.4 oz (79.1 kg)   SpO2 97%   BMI 28.58 kg/m    Subjective:    Patient ID: Diane Acosta, female    DOB: 10-Aug-1960, 60 y.o.   MRN: [id number]  HPI: Diane Acosta is a 60 y.o. female  Chief Complaint  Patient presents with   Hypertension   Ovarian Cancer   Tick Removal    Patient states she pulled a tick off of her this morning    Had a second opinion with Dr. Theora Gianotti in April. She really liked her. She is on a break from the chemo right now as the symptoms of the chemo were worse than the cancer symptoms. She would like to wait a month before doing her PET scan so she can have a vacation. She notes that she is feeling better than she has for months.   HYPERTENSION Hypertension status: controlled  Satisfied with current treatment? yes Duration of hypertension: chronic BP monitoring frequency:  not checking BP range:  BP medication side effects:  no Medication compliance: excellent compliance Previous BP meds:amlodipine, metoprolol Aspirin: no Recurrent headaches: no Visual changes: no Palpitations: no Dyspnea: no Chest pain: no Lower extremity edema: no Dizzy/lightheaded: no  Tick Bite Duration: this morning Location: R arm Onset: unsure Itching: no Status: resolved Fever: no Chills: no headache: no Muscle pain: no Rash: no   Relevant past medical, surgical, family and social history reviewed and updated as indicated. Interim medical history since our last visit reviewed. Allergies and medications reviewed and updated.  Review of Systems  Constitutional: Negative.   Respiratory: Negative.    Cardiovascular: Negative.   Gastrointestinal: Negative.   Musculoskeletal: Negative.   Skin: Negative.   Neurological: Negative.   Psychiatric/Behavioral: Negative.     Per HPI unless specifically indicated above     Objective:    BP 135/77   Pulse 93   Temp 97.9  F (36.6 C)   Ht 5' 5.5" (1.664 m)   Wt 174 lb 6.4 oz (79.1 kg)   SpO2 97%   BMI 28.58 kg/m   Wt Readings from Last 3 Encounters:  12/30/20 174 lb 6.4 oz (79.1 kg)  10/21/20 172 lb 8 oz (78.2 kg)  09/29/20 181 lb 6.4 oz (82.3 kg)    Physical Exam Vitals and nursing note reviewed.  Constitutional:      General: She is not in acute distress.    Appearance: Normal appearance. She is not ill-appearing, toxic-appearing or diaphoretic.  HENT:     Head: Normocephalic and atraumatic.     Right Ear: External ear normal.     Left Ear: External ear normal.     Nose: Nose normal.     Mouth/Throat:     Mouth: Mucous membranes are moist.     Pharynx: Oropharynx is clear.  Eyes:     General: No scleral icterus.       Right eye: No discharge.        Left eye: No discharge.     Extraocular Movements: Extraocular movements intact.     Conjunctiva/sclera: Conjunctivae normal.     Pupils: Pupils are equal, round, and reactive to light.  Cardiovascular:     Rate and Rhythm: Normal rate and regular rhythm.     Pulses: Normal pulses.     Heart sounds: Normal heart sounds. No murmur heard.  No friction rub. No gallop.  Pulmonary:     Effort: Pulmonary effort is normal. No respiratory distress.     Breath sounds: Normal breath sounds. No stridor. No wheezing, rhonchi or rales.  Chest:     Chest wall: No tenderness.  Musculoskeletal:        General: Normal range of motion.     Cervical back: Normal range of motion and neck supple.  Skin:    General: Skin is warm and dry.     Capillary Refill: Capillary refill takes less than 2 seconds.     Coloration: Skin is not jaundiced or pale.     Findings: No bruising, erythema, lesion or rash.  Neurological:     General: No focal deficit present.     Mental Status: She is alert and oriented to person, place, and time. Mental status is at baseline.  Psychiatric:        Mood and Affect: Mood normal.        Behavior: Behavior normal.        Thought  Content: Thought content normal.        Judgment: Judgment normal.    Results for orders placed or performed in visit on 08/25/20  CBC with Differential/Platelet  Result Value Ref Range   WBC 5.1 3.4 - 10.8 x10E3/uL   RBC 3.98 3.77 - 5.28 x10E6/uL   Hemoglobin 9.7 (L) 11.1 - 15.9 g/dL   Hematocrit 32.1 (L) 34.0 - 46.6 %   MCV 81 79 - 97 fL   MCH 24.4 (L) 26.6 - 33.0 pg   MCHC 30.2 (L) 31.5 - 35.7 g/dL   RDW 19.5 (H) 11.7 - 15.4 %   Platelets 472 (H) 150 - 450 x10E3/uL   Neutrophils 68 Not Estab. %   Lymphs 29 Not Estab. %   Monocytes 2 Not Estab. %   Eos 0 Not Estab. %   Basos 0 Not Estab. %   Neutrophils Absolute 3.4 1.4 - 7.0 x10E3/uL   Lymphocytes Absolute 1.4 0.7 - 3.1 x10E3/uL   Monocytes Absolute 0.1 0.1 - 0.9 x10E3/uL   EOS (ABSOLUTE) 0.0 0.0 - 0.4 x10E3/uL   Basophils Absolute 0.0 0.0 - 0.2 x10E3/uL   Immature Granulocytes 1 Not Estab. %   Immature Grans (Abs) 0.1 0.0 - 0.1 x10E3/uL      Assessment & Plan:   Problem List Items Addressed This Visit       Cardiovascular and Mediastinum   Essential hypertension - Primary    Under good control on current regimen. Continue current regimen. Continue to monitor. Call with any concerns. Refills given. Labs drawn today.        Relevant Medications   EPINEPHrine 0.3 mg/0.3 mL IJ SOAJ injection   apixaban (ELIQUIS) 5 MG TABS tablet   Other Relevant Orders   Comprehensive metabolic panel   CBC with Differential/Platelet   Atherosclerosis of right carotid artery    Will keep BP and cholesterol under good control. Continue to monitor. Call with any concerns.        Relevant Medications   EPINEPHrine 0.3 mg/0.3 mL IJ SOAJ injection   apixaban (ELIQUIS) 5 MG TABS tablet   Other Relevant Orders   Lipid Panel w/o Chol/HDL Ratio   Comprehensive metabolic panel   CBC with Differential/Platelet     Endocrine   Malignant neoplasm of ovary ([facility])    On hold with treatment right now due to side effects. Following closely  with oncology. Call with any concerns. Continue to  monitor.        Relevant Medications   doxycycline (VIBRA-TABS) 100 MG tablet   Other Relevant Orders   Comprehensive metabolic panel   CBC with Differential/Platelet   Other Visit Diagnoses     Tick bite of right thigh, initial encounter       Will treat prophylactically with doxycycline. Call if starts with any symptoms in the next 6 weeks.    Encounter for screening mammogram for malignant neoplasm of breast       Mammogram ordered today.   Relevant Orders   MM 3D SCREEN BREAST BILATERAL        Follow up plan: Return in about 3 months (around 04/01/2021).   >30 minutes spent with patient today.

## 2020-12-31 ENCOUNTER — Encounter: Payer: Self-pay | Admitting: Family Medicine

## 2021-01-04 NOTE — Assessment & Plan Note (Signed)
Will keep BP and cholesterol under good control. Continue to monitor. Call with any concerns.  

## 2021-01-04 NOTE — Assessment & Plan Note (Signed)
On hold with treatment right now due to side effects. Following closely with oncology. Call with any concerns. Continue to monitor.

## 2021-01-04 NOTE — Assessment & Plan Note (Signed)
Under good control on current regimen. Continue current regimen. Continue to monitor. Call with any concerns. Refills given. Labs drawn today.   

## 2021-01-15 LAB — HM MAMMOGRAPHY

## 2021-02-14 ENCOUNTER — Other Ambulatory Visit: Payer: Self-pay | Admitting: Family Medicine

## 2021-02-14 NOTE — Telephone Encounter (Signed)
Requested Prescriptions  Pending Prescriptions Disp Refills  . metoprolol succinate (TOPROL-XL) 25 MG 24 hr tablet [Pharmacy Med Name: METOPROLOL ER SUCCINATE '25MG'$  TABS] 180 tablet 1    Sig: TAKE 1/2 TO 1 TABLET(12.5 TO 25 MG) BY MOUTH TWICE DAILY AS NEEDED     Cardiovascular:  Beta Blockers Passed - 02/14/2021  3:23 PM      Passed - Last BP in normal range    BP Readings from Last 1 Encounters:  12/30/20 135/77         Passed - Last Heart Rate in normal range    Pulse Readings from Last 1 Encounters:  12/30/20 93         Passed - Valid encounter within last 6 months    Recent Outpatient Visits          1 month ago Essential hypertension   [place], Megan P, DO   4 months ago Malignant neoplasm of ovary, unspecified laterality ([place])   [place], Megan P, DO   6 months ago Orthostatic hypotension   [place], [place], DO   7 months ago Tachycardia   [place] P, DO   8 months ago Tachycardia   [place], [place], DO      Future Appointments            In 1 month Johnson, Barb Merino, DO [employer]

## 2021-02-24 ENCOUNTER — Encounter: Payer: Self-pay | Admitting: Family Medicine

## 2021-03-01 DIAGNOSIS — D509 Iron deficiency anemia, unspecified: Secondary | ICD-10-CM | POA: Insufficient documentation

## 2021-03-02 ENCOUNTER — Encounter: Payer: Self-pay | Admitting: Family Medicine

## 2021-03-02 NOTE — Telephone Encounter (Signed)
Will leave for Dr. Wynetta Emery review and to alert her.

## 2021-03-04 ENCOUNTER — Telehealth: Payer: Self-pay | Admitting: Family Medicine

## 2021-03-04 NOTE — Telephone Encounter (Signed)
Pt came in and dropped off a CTE physician screening form to be completed by the provider.  Pt is aware the provider is out of the office and will be back on Monday 03/08/2021.  Upon completion pt would like to have a call so she is able to pick it up.  Placed in providers folder for completion.

## 2021-03-04 NOTE — Telephone Encounter (Signed)
I do not see a recent CPE for patient, unsure if patient will need an appt.

## 2021-03-11 NOTE — Telephone Encounter (Signed)
Per Dr. Wynetta Emery, patient needs a CPE to have form completed. Please call to schedule. Form placed in the incomplete bin until appointment.

## 2021-03-11 NOTE — Telephone Encounter (Signed)
Patient scheduled.

## 2021-03-11 NOTE — Telephone Encounter (Signed)
Has not had physcial- needs physcial or at least labs

## 2021-03-12 NOTE — Telephone Encounter (Signed)
Patient states she had blood work at [facility] in March and a mamo. Patient needs the form prior to 04/01/2021 physical appointment and would like a follow up call today best # [phone]

## 2021-03-16 ENCOUNTER — Encounter: Payer: Self-pay | Admitting: Family Medicine

## 2021-03-17 NOTE — Telephone Encounter (Signed)
Looks like patient had labs done with [facility] 03/01/21. Can these results be used on the form?

## 2021-03-19 MED ORDER — GABAPENTIN 100 MG PO CAPS: 100.0000 mg | ORAL_CAPSULE | Freq: Every day | ORAL | 6 refills | Status: DC

## 2021-03-31 ENCOUNTER — Encounter: Payer: Self-pay | Admitting: Family Medicine

## 2021-04-01 ENCOUNTER — Encounter: Payer: Self-pay | Admitting: Family Medicine

## 2021-04-01 ENCOUNTER — Other Ambulatory Visit: Payer: Self-pay

## 2021-04-01 ENCOUNTER — Ambulatory Visit (INDEPENDENT_AMBULATORY_CARE_PROVIDER_SITE_OTHER): Payer: Commercial Managed Care - PPO | Admitting: Family Medicine

## 2021-04-01 VITALS — BP 125/77 | HR 79 | Temp 98.2°F | Resp 16 | Ht 68.0 in | Wt 160.6 lb

## 2021-04-01 DIAGNOSIS — Z0000 Encounter for general adult medical examination without abnormal findings: Secondary | ICD-10-CM | POA: Diagnosis not present

## 2021-04-01 DIAGNOSIS — I10 Essential (primary) hypertension: Secondary | ICD-10-CM | POA: Diagnosis not present

## 2021-04-01 DIAGNOSIS — I82B11 Acute embolism and thrombosis of right subclavian vein: Secondary | ICD-10-CM

## 2021-04-01 DIAGNOSIS — E7521 Fabry (-Anderson) disease: Secondary | ICD-10-CM | POA: Diagnosis not present

## 2021-04-01 DIAGNOSIS — E7800 Pure hypercholesterolemia, unspecified: Secondary | ICD-10-CM | POA: Diagnosis not present

## 2021-04-01 DIAGNOSIS — Z23 Encounter for immunization: Secondary | ICD-10-CM | POA: Diagnosis not present

## 2021-04-01 DIAGNOSIS — E041 Nontoxic single thyroid nodule: Secondary | ICD-10-CM

## 2021-04-01 LAB — URINALYSIS, ROUTINE W REFLEX MICROSCOPIC
Bilirubin, UA: NEGATIVE
Glucose, UA: NEGATIVE
Ketones, UA: NEGATIVE
Nitrite, UA: NEGATIVE
Protein,UA: NEGATIVE
RBC, UA: NEGATIVE
Specific Gravity, UA: 1.02 (ref 1.005–1.030)
Urobilinogen, Ur: 0.2 mg/dL (ref 0.2–1.0)
pH, UA: 6 (ref 5.0–7.5)

## 2021-04-01 LAB — MICROSCOPIC EXAMINATION
Bacteria, UA: NONE SEEN
RBC, Urine: NONE SEEN /hpf (ref 0–2)

## 2021-04-01 LAB — MICROALBUMIN, URINE WAIVED
Creatinine, Urine Waived: 200 mg/dL (ref 10–300)
Microalb, Ur Waived: 30 mg/L — ABNORMAL HIGH (ref 0–19)
Microalb/Creat Ratio: <30 mg/g (ref <30)

## 2021-04-01 NOTE — Telephone Encounter (Signed)
Spoke with pt and they stated they didn't need the appt because Dr. Wynetta Emery already did the blood test and the results won't come back until tomorrow.

## 2021-04-01 NOTE — Progress Notes (Signed)
BP 125/77 (BP Location: Left Arm, Patient Position: Sitting, Cuff Size: Large)   Pulse 79   Temp 98.2 F (36.8 C) (Oral)   Resp 16   Ht 5\' 8"  (1.727 m)   Wt 160 lb 9.6 oz (72.8 kg)   SpO2 100%   BMI 24.42 kg/m    Subjective:    Patient ID: Diane Acosta, female    DOB: [birth year], 60 y.o.   MRN: [id number]  HPI: Diane Acosta is a 60 y.o. female presenting on 04/01/2021 for comprehensive medical examination. Current medical complaints include:  Has been diagnosed with Alpha-gal. Has been upset because she cannot see the alpha-gal specialist at [facility]. She would like to see an acupuncturist.   She has been having some aching in her back around her shoulder blade she is concerned that the blood pressure medicine is causing it.   HYPERTENSION Hypertension status: controlled  Satisfied with current treatment? yes Duration of hypertension: chronic BP monitoring frequency:  a few times a week BP medication side effects:  no Medication compliance: excellent compliance Previous BP meds: metoprolol Aspirin: no Recurrent headaches: no Visual changes: no Palpitations: no Dyspnea: no Chest pain: no Lower extremity edema: no Dizzy/lightheaded: no  She currently lives with: husband Menopausal Symptoms: no  Depression Screen done today and results listed below:  Depression screen [facility] 2/9 04/01/2021 12/30/2020 11/11/2019 10/10/2018 05/07/2018  Decreased Interest 0 0 0 0 0  Down, Depressed, Hopeless 0 0 1 0 0  PHQ - 2 Score 0 0 1 0 0  Altered sleeping 0 - - - -  Tired, decreased energy 0 - - - -  Change in appetite 0 - - - -  Feeling bad or failure about yourself  0 - - - -  Trouble concentrating 0 - - - -  Moving slowly or fidgety/restless 0 - - - -  Suicidal thoughts 0 - - - -  PHQ-9 Score 0 - - - -  Difficult doing work/chores Not difficult at all - - - -    Past Medical History:  Past Medical History:  Diagnosis Date   Asthma    Blood clot in vein     Surgical  History:  Past Surgical History:  Procedure Laterality Date   ABDOMINAL HYSTERECTOMY     APPENDECTOMY      Medications:  Current Outpatient Medications on File Prior to Visit  Medication Sig   apixaban (ELIQUIS) 5 MG TABS tablet TAKE 1 TABLET(5 MG) BY MOUTH TWICE DAILY   EPINEPHrine 0.3 mg/0.3 mL IJ SOAJ injection Inject 0.3 mg into the muscle as needed for anaphylaxis.   famotidine (PEPCID) 20 MG tablet Take 20 mg by mouth 2 (two) times daily.   fexofenadine (ALLEGRA) 180 MG tablet    gabapentin (NEURONTIN) 100 MG capsule Take 1-3 capsules (100-300 mg total) by mouth at bedtime.   metoprolol succinate (TOPROL-XL) 25 MG 24 hr tablet TAKE 1/2 TO 1 TABLET(12.5 TO 25 MG) BY MOUTH TWICE DAILY AS NEEDED   No current facility-administered medications on file prior to visit.    Allergies:  Allergies  Allergen Reactions   Alpha-Gal Diarrhea, Hives, Anaphylaxis, Itching, Rash and Swelling    Other reaction(s): Other (See Comments) Wheezing    Hydralazine Hcl Anaphylaxis   Levofloxacin     Other reaction(s): Other (See Comments) Hypertensive emergency. Other reaction(s): Other (See Comments) Reaction: Hypertensive emergency.    Social History:  Social History   Socioeconomic History   Marital status: Married  Spouse name: Not on file   Number of children: 2   Years of education: Not on file   Highest education level: Not on file  Occupational History   Not on file  Tobacco Use   Smoking status: Never   Smokeless tobacco: Never  Vaping Use   Vaping Use: Never used  Substance and Sexual Activity   Alcohol use: Yes    Alcohol/week: 0.0 standard drinks    Comment: rare   Drug use: No   Sexual activity: Not on file  Other Topics Concern   Not on file  Social History Narrative   Not on file   Social Determinants of Health   Financial Resource Strain: Not on file  Food Insecurity: Not on file  Transportation Needs: Not on file  Physical Activity: Not on file   Stress: Not on file  Social Connections: Not on file  Intimate Partner Violence: Not on file   Social History   Tobacco Use  Smoking Status Never  Smokeless Tobacco Never   Social History   Substance and Sexual Activity  Alcohol Use Yes   Alcohol/week: 0.0 standard drinks   Comment: rare    Family History:  Family History  Problem Relation Age of Onset   Thyroid disease Mother    Mental illness Mother    Diverticulitis Mother    Diverticulitis Father    Heart disease Father    Hypertension Father    Stroke Paternal Grandmother    Stroke Paternal Grandfather     Past medical history, surgical history, medications, allergies, family history and social history reviewed with patient today and changes made to appropriate areas of the chart.   Review of Systems  Constitutional: Negative.   HENT: Negative.    Eyes: Negative.   Respiratory: Negative.    Cardiovascular: Negative.   Gastrointestinal: Negative.   Genitourinary: Negative.   Musculoskeletal: Negative.   Skin: Negative.   Neurological: Negative.   Endo/Heme/Allergies: Negative.   Psychiatric/Behavioral: Negative.    All other ROS negative except what is listed above and in the HPI.      Objective:    BP 125/77 (BP Location: Left Arm, Patient Position: Sitting, Cuff Size: Large)   Pulse 79   Temp 98.2 F (36.8 C) (Oral)   Resp 16   Ht 5\' 8"  (1.727 m)   Wt 160 lb 9.6 oz (72.8 kg)   SpO2 100%   BMI 24.42 kg/m   Wt Readings from Last 3 Encounters:  04/01/21 160 lb 9.6 oz (72.8 kg)  12/30/20 174 lb 6.4 oz (79.1 kg)  10/21/20 172 lb 8 oz (78.2 kg)    Physical Exam Vitals and nursing note reviewed.  Constitutional:      General: She is not in acute distress.    Appearance: Normal appearance. She is not ill-appearing, toxic-appearing or diaphoretic.  HENT:     Head: Normocephalic and atraumatic.     Right Ear: Tympanic membrane, ear canal and external ear normal. There is no impacted cerumen.      Left Ear: Tympanic membrane, ear canal and external ear normal. There is no impacted cerumen.     Nose: Nose normal. No congestion or rhinorrhea.     Mouth/Throat:     Mouth: Mucous membranes are moist.     Pharynx: Oropharynx is clear. No oropharyngeal exudate or posterior oropharyngeal erythema.  Eyes:     General: No scleral icterus.       Right eye: No discharge.  Left eye: No discharge.     Extraocular Movements: Extraocular movements intact.     Conjunctiva/sclera: Conjunctivae normal.     Pupils: Pupils are equal, round, and reactive to light.  Neck:     Vascular: No carotid bruit.  Cardiovascular:     Rate and Rhythm: Normal rate and regular rhythm.     Pulses: Normal pulses.     Heart sounds: No murmur heard.   No friction rub. No gallop.  Pulmonary:     Effort: Pulmonary effort is normal. No respiratory distress.     Breath sounds: Normal breath sounds. No stridor. No wheezing, rhonchi or rales.  Chest:     Chest wall: No tenderness.  Abdominal:     General: Abdomen is flat. Bowel sounds are normal. There is no distension.     Palpations: Abdomen is soft. There is no mass.     Tenderness: There is no abdominal tenderness. There is no right CVA tenderness, left CVA tenderness, guarding or rebound.     Hernia: No hernia is present.  Genitourinary:    Comments: Breast and pelvic exams deferred with shared decision making Musculoskeletal:        General: No swelling, tenderness, deformity or signs of injury.     Cervical back: Normal range of motion and neck supple. No rigidity. No muscular tenderness.     Right lower leg: No edema.     Left lower leg: No edema.  Lymphadenopathy:     Cervical: No cervical adenopathy.  Skin:    General: Skin is warm and dry.     Capillary Refill: Capillary refill takes less than 2 seconds.     Coloration: Skin is not jaundiced or pale.     Findings: No bruising, erythema, lesion or rash.  Neurological:     General: No focal  deficit present.     Mental Status: She is alert and oriented to person, place, and time. Mental status is at baseline.     Cranial Nerves: No cranial nerve deficit.     Sensory: No sensory deficit.     Motor: No weakness.     Coordination: Coordination normal.     Gait: Gait normal.     Deep Tendon Reflexes: Reflexes normal.  Psychiatric:        Mood and Affect: Mood normal.        Behavior: Behavior normal.        Thought Content: Thought content normal.        Judgment: Judgment normal.    Results for orders placed or performed in visit on 04/01/21  Microscopic Examination   Urine  Result Value Ref Range   WBC, UA 0-5 0 - 5 /hpf   RBC None seen 0 - 2 /hpf   Epithelial Cells (non renal) 0-10 0 - 10 /hpf   Mucus, UA Present (A) Not Estab.   Bacteria, UA None seen None seen/Few  HM MAMMOGRAPHY  Result Value Ref Range   HM Mammogram 0-4 Bi-Rad 0-4 Bi-Rad, Self Reported Normal  Urinalysis, Routine w reflex microscopic  Result Value Ref Range   Specific Gravity, UA 1.020 1.005 - 1.030   pH, UA 6.0 5.0 - 7.5   Color, UA Yellow Yellow   Appearance Ur Clear Clear   Leukocytes,UA 1+ (A) Negative   Protein,UA Negative Negative/Trace   Glucose, UA Negative Negative   Ketones, UA Negative Negative   RBC, UA Negative Negative   Bilirubin, UA Negative Negative   Urobilinogen, Ur 0.2  0.2 - 1.0 mg/dL   Nitrite, UA Negative Negative   Microscopic Examination See below:   Microalbumin, Urine Waived  Result Value Ref Range   Microalb, Ur Waived 30 (H) 0 - 19 mg/L   Creatinine, Urine Waived 200 10 - 300 mg/dL   Microalb/Creat Ratio <30 <30 mg/g      Assessment & Plan:   Problem List Items Addressed This Visit       Cardiovascular and Mediastinum   Essential hypertension    Under good control on current regimen. Continue current regimen. Continue to monitor. Call with any concerns. Refills given. Labs drawn today.       Right subclavian vein thrombosis (HCC)    Stable.  Continue to follow with oncology. Continue eliquis. Continue to monitor.       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 Continue to monitor. Call with any concerns.         Endocrine   Thyroid nodule    Rechecking labs today. Await results. Treat as needed.         Other   Hypercholesterolemia    Rechecking labs today. Await results. Treat as needed.       Other Visit Diagnoses     Routine general medical examination at a health care facility    -  Primary   Vaccines up to date. Screening labs checked today. Pap N/A. Mammogram and colonoscopy up to date. Continue diet and exercise. Call with concerns.    Relevant Orders   Urinalysis, Routine w reflex microscopic (Completed)   Microalbumin, Urine Waived (Completed)        Follow up plan: Return in about 6 months (around 09/29/2021).   LABORATORY TESTING:  - Pap smear: not applicable  IMMUNIZATIONS:   - Tdap: Tetanus vaccination status reviewed: last tetanus booster within 10 years. - Influenza: Administered today - Pneumovax: Up to date - Prevnar: Not applicable - COVID: Up to date - shingrix vaccine:  will check on insurance  SCREENING: -Mammogram: Up to date  - Colonoscopy: Up to date   PATIENT COUNSELING:   Advised to take 1 mg of folate supplement per day if capable of pregnancy.   Sexuality: Discussed sexually transmitted diseases, partner selection, use of condoms, avoidance of unintended pregnancy  and contraceptive alternatives.   Advised to avoid cigarette smoking.  I discussed with the patient that most people either abstain from alcohol or drink within safe limits (<=14/week and <=4 drinks/occasion for males, <=7/weeks and <= 3 drinks/occasion for females) and that the risk for alcohol disorders and other health effects rises proportionally with the number of drinks per week and how often a drinker exceeds daily  limits.  Discussed cessation/primary prevention of drug use and availability of treatment for abuse.   Diet: Encouraged to adjust caloric intake to maintain  or achieve ideal body weight, to reduce intake of dietary saturated fat and total fat, to limit sodium intake by avoiding high sodium foods and not adding table salt, and to maintain adequate dietary potassium and calcium preferably from fresh fruits, vegetables, and low-fat dairy products.    stressed the importance of regular exercise  Injury prevention: Discussed safety belts, safety helmets, smoke detector, smoking near bedding or upholstery.   Dental health: Discussed importance of regular tooth brushing, flossing, and dental visits.    NEXT PREVENTATIVE PHYSICAL DUE IN 1 YEAR. Return in about 6 months (around 09/29/2021).

## 2021-04-02 NOTE — Telephone Encounter (Signed)
Pt is scheduled today at 4 with Lauren

## 2021-04-09 ENCOUNTER — Encounter: Payer: Self-pay | Admitting: Family Medicine

## 2021-04-09 DIAGNOSIS — E7521 Fabry (-Anderson) disease: Secondary | ICD-10-CM | POA: Insufficient documentation

## 2021-04-09 NOTE — Assessment & Plan Note (Signed)
Rechecking labs today. Await results. Treat as needed.  °

## 2021-04-09 NOTE — Assessment & Plan Note (Signed)
Under good control on current regimen. Continue current regimen. Continue to monitor. Call with any concerns. Refills given. Labs drawn today.   

## 2021-04-09 NOTE — Assessment & Plan Note (Signed)
Stable. Continue to follow with oncology. Continue eliquis. Continue to monitor.

## 2021-04-09 NOTE — Assessment & Plan Note (Signed)
Newly diagnosed- we will see if we can get her into see the specialist. Continue to monitor. Call with any concerns.

## 2021-04-28 ENCOUNTER — Encounter: Payer: Self-pay | Admitting: Family Medicine

## 2021-04-28 ENCOUNTER — Other Ambulatory Visit: Payer: Self-pay | Admitting: Family Medicine

## 2021-04-29 NOTE — Telephone Encounter (Signed)
Attempted to call Friend but due to long hold time disconnected and will call back at later time.

## 2021-04-29 NOTE — Telephone Encounter (Signed)
FYI

## 2021-04-29 NOTE — Telephone Encounter (Signed)
Requested medication (s) are due for refill today No. Last ordered on 12/30/20 #180 tabs with 3 refills.  Requested medication (s) are on the active medication list   yes  Future visit scheduled  Yes on 09/29/20.   Note to clinic-Received refill request today. Patient should have 2 additional refills on file with [facility]. Several calls to pharmacy to verify that she has refills available have been unsuccessful-very long wait times with the pharmacy. Did not want to refuse without verification of refills on file. Routing to clinic for review.    Requested Prescriptions  Pending Prescriptions Disp Refills   ELIQUIS 5 MG TABS tablet [Pharmacy Med Name: JQBHALP 5MG  TABLETS] 180 tablet 3    Sig: TAKE 1 TABLET(5 MG) BY MOUTH TWICE DAILY     Hematology:  Anticoagulants Failed - 04/28/2021  2:56 PM      Failed - HGB in normal range and within 360 days    Hemoglobin  Date Value Ref Range Status  08/25/2020 9.7 (L) 11.1 - 15.9 g/dL Final          Failed - PLT in normal range and within 360 days    Platelets  Date Value Ref Range Status  08/25/2020 472 (H) 150 - 450 x10E3/uL Final          Failed - HCT in normal range and within 360 days    Hematocrit  Date Value Ref Range Status  08/25/2020 32.1 (L) 34.0 - 46.6 % Final          Failed - Cr in normal range and within 360 days    Creatinine, Ser  Date Value Ref Range Status  08/13/2020 0.49 (L) 0.57 - 1.00 mg/dL Final          Passed - Valid encounter within last 12 months    Recent Outpatient Visits           4 weeks ago Routine general medical examination at a health care facility   [facility], [place], DO   4 months ago Essential hypertension   [place], [place], DO   7 months ago Malignant neoplasm of ovary, unspecified laterality [facility])   [place], Megan P, DO   8 months ago Orthostatic hypotension   [place], [place], DO   9  months ago Tachycardia   [place], [place], DO       Future Appointments             In 5 months Wynetta Emery, Barb Merino, DO [employer]

## 2021-04-30 NOTE — Telephone Encounter (Signed)
Spoke with patient and she states that she has enough for one week. Patient states she knows she has refills on her prescription, but every time she goes to the pharmacy, they tell her she is not due. Patient states she actually received an email stated her Rx was ready for pick up. FYI

## 2021-04-30 NOTE — Telephone Encounter (Signed)
She should not be due. Please make sure she has refills at the pharmacy

## 2021-09-29 ENCOUNTER — Encounter: Payer: Self-pay | Admitting: Family Medicine

## 2021-09-29 ENCOUNTER — Other Ambulatory Visit: Payer: Self-pay

## 2021-09-29 ENCOUNTER — Ambulatory Visit (INDEPENDENT_AMBULATORY_CARE_PROVIDER_SITE_OTHER): Payer: Commercial Managed Care - PPO | Admitting: Family Medicine

## 2021-09-29 VITALS — BP 132/72 | HR 86 | Temp 98.2°F | Wt 169.0 lb

## 2021-09-29 DIAGNOSIS — I10 Essential (primary) hypertension: Secondary | ICD-10-CM

## 2021-09-29 DIAGNOSIS — C569 Malignant neoplasm of unspecified ovary: Secondary | ICD-10-CM

## 2021-09-29 DIAGNOSIS — E041 Nontoxic single thyroid nodule: Secondary | ICD-10-CM

## 2021-09-29 DIAGNOSIS — E7800 Pure hypercholesterolemia, unspecified: Secondary | ICD-10-CM

## 2021-09-29 DIAGNOSIS — T451X5A Adverse effect of antineoplastic and immunosuppressive drugs, initial encounter: Secondary | ICD-10-CM

## 2021-09-29 DIAGNOSIS — G620 Drug-induced polyneuropathy: Secondary | ICD-10-CM

## 2021-09-29 DIAGNOSIS — C801 Malignant (primary) neoplasm, unspecified: Secondary | ICD-10-CM

## 2021-09-29 DIAGNOSIS — E7521 Fabry (-Anderson) disease: Secondary | ICD-10-CM

## 2021-09-29 DIAGNOSIS — I82B11 Acute embolism and thrombosis of right subclavian vein: Secondary | ICD-10-CM

## 2021-09-29 MED ORDER — EPINEPHRINE 0.3 MG/0.3ML IJ SOAJ: 0.3000 mg | INTRAMUSCULAR | 12 refills | Status: DC | PRN

## 2021-09-29 MED ORDER — METOPROLOL SUCCINATE ER 25 MG PO TB24
ORAL_TABLET | ORAL | 1 refills | Status: DC
Start: 2021-09-29 — End: 2022-04-18

## 2021-09-29 MED ORDER — APIXABAN 5 MG PO TABS: ORAL_TABLET | ORAL | 3 refills | Status: DC

## 2021-09-29 NOTE — Progress Notes (Signed)
? ?BP 132/72   Pulse 86   Temp 98.2 ?F (36.8 ?C)   Wt 169 lb (76.7 kg)   SpO2 99%   BMI 25.70 kg/m?   ? ?Subjective:  ? ? Patient ID: Diane Acosta, female    DOB: March 12, 1961, 61 y.o.   MRN: [id number] ? ?HPI: ?Diane Acosta is a 61 y.o. female ? ?Chief Complaint  ?Patient presents with  ? Hypertension  ? Hyperlipidemia  ? Thyroid Nodule  ? ?Things with her cancer have been good. She had a PET in January and is going to have another one in April. Her neuropathy has been driving her nuts. Her oncologist has referred her to a specialist and she hopes that they will be able to help. She has been very weak in her thighs and has noticed that they have not been as strong as they had been. She has been working on strengthening them up.  ? ?She is working with the allergist on the [place], but she is not feeling well with it. She is still working with them on trying to feel better.  ? ?HYPERTENSION / HYPERLIPIDEMIA ?Satisfied with current treatment? yes ?Duration of hypertension: chronic ?BP monitoring frequency: not checking ?BP medication side effects: no ?Past BP meds: metoprolol ?Duration of hyperlipidemia: chronic ?Cholesterol medication side effects: no ?Cholesterol supplements: none ?Past cholesterol medications: none ?Medication compliance: excellent compliance ?Aspirin: no ?Recent stressors: no ?Recurrent headaches: no ?Visual changes: no ?Palpitations: no ?Dyspnea: no ?Chest pain: no ?Lower extremity edema: no ?Dizzy/lightheaded: no ? ?Relevant past medical, surgical, family and social history reviewed and updated as indicated. Interim medical history since our last visit reviewed. ?Allergies and medications reviewed and updated. ? ?Review of Systems  ?Constitutional:  Positive for fatigue. Negative for activity change, appetite change, chills, diaphoresis, fever and unexpected weight change.  ?Respiratory: Negative.    ?Cardiovascular: Negative.   ?Musculoskeletal: Negative.   ?Neurological:   Positive for numbness. Negative for dizziness, tremors, seizures, syncope, facial asymmetry, speech difficulty, weakness, light-headedness and headaches.  ?Psychiatric/Behavioral: Negative.    ? ?Per HPI unless specifically indicated above ? ?   ?Objective:  ?  ?BP 132/72   Pulse 86   Temp 98.2 ?F (36.8 ?C)   Wt 169 lb (76.7 kg)   SpO2 99%   BMI 25.70 kg/m?   ?Wt Readings from Last 3 Encounters:  ?09/29/21 169 lb (76.7 kg)  ?04/01/21 160 lb 9.6 oz (72.8 kg)  ?12/30/20 174 lb 6.4 oz (79.1 kg)  ?  ?Physical Exam ?Vitals and nursing note reviewed.  ?Constitutional:   ?   General: She is not in acute distress. ?   Appearance: Normal appearance. She is not ill-appearing, toxic-appearing or diaphoretic.  ?HENT:  ?   Head: Normocephalic and atraumatic.  ?   Right Ear: External ear normal.  ?   Left Ear: External ear normal.  ?   Nose: Nose normal.  ?   Mouth/Throat:  ?   Mouth: Mucous membranes are moist.  ?   Pharynx: Oropharynx is clear.  ?Eyes:  ?   General: No scleral icterus.    ?   Right eye: No discharge.     ?   Left eye: No discharge.  ?   Extraocular Movements: Extraocular movements intact.  ?   Conjunctiva/sclera: Conjunctivae normal.  ?   Pupils: Pupils are equal, round, and reactive to light.  ?Cardiovascular:  ?   Rate and Rhythm: Normal rate and regular rhythm.  ?   Pulses:  Normal pulses.  ?   Heart sounds: Normal heart sounds. No murmur heard. ?  No friction rub. No gallop.  ?Pulmonary:  ?   Effort: Pulmonary effort is normal. No respiratory distress.  ?   Breath sounds: Normal breath sounds. No stridor. No wheezing, rhonchi or rales.  ?Chest:  ?   Chest wall: No tenderness.  ?Musculoskeletal:     ?   General: Normal range of motion.  ?   Cervical back: Normal range of motion and neck supple.  ?Skin: ?   General: Skin is warm and dry.  ?   Capillary Refill: Capillary refill takes less than 2 seconds.  ?   Coloration: Skin is not jaundiced or pale.  ?   Findings: No bruising, erythema, lesion or rash.   ?Neurological:  ?   General: No focal deficit present.  ?   Mental Status: She is alert and oriented to person, place, and time. Mental status is at baseline.  ?Psychiatric:     ?   Mood and Affect: Mood normal.     ?   Behavior: Behavior normal.     ?   Thought Content: Thought content normal.     ?   Judgment: Judgment normal.  ? ? ?Results for orders placed or performed in visit on 04/01/21  ?Microscopic Examination  ? Urine  ?Result Value Ref Range  ? WBC, UA 0-5 0 - 5 /hpf  ? RBC None seen 0 - 2 /hpf  ? Epithelial Cells (non renal) 0-10 0 - 10 /hpf  ? Mucus, UA Present (A) Not Estab.  ? Bacteria, UA None seen None seen/Few  ?HM MAMMOGRAPHY  ?Result Value Ref Range  ? HM Mammogram 0-4 Bi-Rad 0-4 Bi-Rad, Self Reported Normal  ?Urinalysis, Routine w reflex microscopic  ?Result Value Ref Range  ? Specific Gravity, UA 1.020 1.005 - 1.030  ? pH, UA 6.0 5.0 - 7.5  ? Color, UA Yellow Yellow  ? Appearance Ur Clear Clear  ? Leukocytes,UA 1+ (A) Negative  ? Protein,UA Negative Negative/Trace  ? Glucose, UA Negative Negative  ? Ketones, UA Negative Negative  ? RBC, UA Negative Negative  ? Bilirubin, UA Negative Negative  ? Urobilinogen, Ur 0.2 0.2 - 1.0 mg/dL  ? Nitrite, UA Negative Negative  ? Microscopic Examination See below:   ?Microalbumin, Urine Waived  ?Result Value Ref Range  ? Microalb, Ur Waived 30 (H) 0 - 19 mg/L  ? Creatinine, Urine Waived 200 10 - 300 mg/dL  ? Microalb/Creat Ratio <30 <30 mg/g  ? ?   ?Assessment & Plan:  ? ?Problem List Items Addressed This Visit   ? ?  ? Cardiovascular and Mediastinum  ? Essential hypertension  ?  Under good control on current regimen. Continue current regimen. Continue to monitor. Call with any concerns. Refills given. Labs to be drawn at cancer center. ? ?  ?  ? Relevant Medications  ? metoprolol succinate (TOPROL-XL) 25 MG 24 hr tablet  ? apixaban (ELIQUIS) 5 MG TABS tablet  ? EPINEPHrine 0.3 mg/0.3 mL IJ SOAJ injection  ? Other Relevant Orders  ? Comprehensive metabolic  panel  ? CBC with Differential/Platelet  ? Right subclavian vein thrombosis (HCC)  ?  On eliquis. Refills given today. Call with any concerns.  ?  ?  ? Relevant Medications  ? metoprolol succinate (TOPROL-XL) 25 MG 24 hr tablet  ? apixaban (ELIQUIS) 5 MG TABS tablet  ? EPINEPHrine 0.3 mg/0.3 mL IJ SOAJ injection  ? Alpha-galactosidase  A deficiency ([place])  ?  Working with alpha gal specialist. Continue to monitor.  ?  ?  ? Relevant Medications  ? metoprolol succinate (TOPROL-XL) 25 MG 24 hr tablet  ? apixaban (ELIQUIS) 5 MG TABS tablet  ? EPINEPHrine 0.3 mg/0.3 mL IJ SOAJ injection  ?  ? Endocrine  ? Malignant neoplasm of ovary ([place])  ?  Will check labs at cancer center. Await results. Treat as needed.  ?  ?  ? Ovarian carcinoma, epithelial ([place])  ?  Continues to follow with oncology. Due for a repeat PET in April.  ?  ?  ? Thyroid nodule  ?  Will check labs at cancer center. Await results. Treat as needed.  ?  ?  ? Relevant Medications  ? metoprolol succinate (TOPROL-XL) 25 MG 24 hr tablet  ? Other Relevant Orders  ? Comprehensive metabolic panel  ? CBC with Differential/Platelet  ? TSH  ?  ? Nervous and Auditory  ? Peripheral neuropathy due to chemotherapy [facility])  ?  Uncontrolled. To be seeing a specialist shortly. Continue to monitor.  ?  ?  ?  ? Other  ? Hypercholesterolemia - Primary  ?  Will check labs at cancer center. Await results. Treat as needed.  ?  ?  ? Relevant Medications  ? metoprolol succinate (TOPROL-XL) 25 MG 24 hr tablet  ? apixaban (ELIQUIS) 5 MG TABS tablet  ? EPINEPHrine 0.3 mg/0.3 mL IJ SOAJ injection  ? Other Relevant Orders  ? Comprehensive metabolic panel  ? CBC with Differential/Platelet  ? Lipid Panel w/o Chol/HDL Ratio  ? 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  Continues to follow with oncology. Due for a repeat PET in April.  ?  ?  ? Hypomagnesemia  ?  Will check labs at cancer center. Await results. Treat as needed.  ?  ?  ? Relevant Orders  ? Comprehensive metabolic panel  ?  CBC with Differential/Platelet  ? Magnesium  ?  ? ?Follow up plan: ?Return in about 6 months (around 04/01/2022) for physical. ? ? ? ? ? ?

## 2021-10-01 ENCOUNTER — Encounter: Payer: Self-pay | Admitting: Family Medicine

## 2021-10-01 NOTE — Assessment & Plan Note (Signed)
Will check labs at cancer center. Await results. Treat as needed.  ?

## 2021-10-01 NOTE — Assessment & Plan Note (Signed)
Continues to follow with oncology. Due for a repeat PET in April.  ?

## 2021-10-01 NOTE — Assessment & Plan Note (Signed)
Working with alpha gal specialist. Continue to monitor.  ?

## 2021-10-01 NOTE — Assessment & Plan Note (Signed)
Uncontrolled. To be seeing a specialist shortly. Continue to monitor.  ?

## 2021-10-01 NOTE — Assessment & Plan Note (Signed)
On eliquis. Refills given today. Call with any concerns.  ?

## 2021-10-01 NOTE — Assessment & Plan Note (Signed)
Under good control on current regimen. Continue current regimen. Continue to monitor. Call with any concerns. Refills given. Labs to be drawn at cancer center. ? ?

## 2021-11-03 ENCOUNTER — Telehealth: Payer: Self-pay | Admitting: Family Medicine

## 2021-11-03 NOTE — Telephone Encounter (Signed)
Patient had 6 month follow up 09/29/21, not due for physical until September. OK to fill out form based on March visit? ?

## 2021-11-03 NOTE — Telephone Encounter (Signed)
Patient brought in a Physician results form to be completed and sent  by provider patient would like to pick up a copy when it is completed. ?

## 2021-11-04 NOTE — Telephone Encounter (Signed)
yes

## 2022-01-22 ENCOUNTER — Other Ambulatory Visit: Payer: Self-pay | Admitting: Family Medicine

## 2022-01-24 NOTE — Telephone Encounter (Signed)
Patient requesting a refill for Eliquis '5mg'$ . Please call patient to advise.

## 2022-01-24 NOTE — Telephone Encounter (Signed)
Years supply sent in in March- should not be due

## 2022-01-24 NOTE — Telephone Encounter (Signed)
Requested medication (s) are due for refill today: Yes  Requested medication (s) are on the active medication list: Yes  Last refill:  09/29/21  Future visit scheduled: Yes  Notes to clinic:  Protocol indicates lab work is needed.    Requested Prescriptions  Pending Prescriptions Disp Refills   ELIQUIS 5 MG TABS tablet [Pharmacy Med Name: ELIQUIS '5MG'$  TABLETS] 180 tablet 3    Sig: TAKE 1 TABLET(5 MG) BY MOUTH TWICE DAILY     Hematology:  Anticoagulants - apixaban Failed - 01/22/2022  3:13 AM      Failed - PLT in normal range and within 360 days    Platelets  Date Value Ref Range Status  08/25/2020 472 (H) 150 - 450 x10E3/uL Final         Failed - HGB in normal range and within 360 days    Hemoglobin  Date Value Ref Range Status  08/25/2020 9.7 (L) 11.1 - 15.9 g/dL Final         Failed - HCT in normal range and within 360 days    Hematocrit  Date Value Ref Range Status  08/25/2020 32.1 (L) 34.0 - 46.6 % Final         Failed - Cr in normal range and within 360 days    Creatinine, Ser  Date Value Ref Range Status  08/13/2020 0.49 (L) 0.57 - 1.00 mg/dL Final         Failed - AST in normal range and within 360 days    AST  Date Value Ref Range Status  08/13/2020 28 0 - 40 IU/L Final   AST (SGOT) [place], Waived  Date Value Ref Range Status  05/07/2018 33 11 - 38 U/L Final         Failed - ALT in normal range and within 360 days    ALT  Date Value Ref Range Status  08/13/2020 27 0 - 32 IU/L Final   ALT (SGPT) Piccolo, Waived  Date Value Ref Range Status  05/07/2018 26 10 - 47 U/L Final         Passed - Valid encounter within last 12 months    Recent Outpatient Visits           3 months ago Hypercholesterolemia   [place], Megan P, DO   9 months ago Routine general medical examination at a health care facility   [place], [place], DO   1 year ago Essential hypertension   [place],  [place], DO   1 year ago Malignant neoplasm of ovary, unspecified laterality [facility])   [place], Megan P, DO   1 year ago Orthostatic hypotension   [facility] [place], [place], DO       Future Appointments             In 2 months Wynetta Emery, Barb Merino, DO [employer]

## 2022-01-25 ENCOUNTER — Encounter: Payer: Self-pay | Admitting: Family Medicine

## 2022-01-25 NOTE — Telephone Encounter (Signed)
Can we check to see if pharmacy got last Rx?

## 2022-01-27 NOTE — Telephone Encounter (Signed)
Can we please check with [facility], year supply was sent in not long ago.

## 2022-01-27 NOTE — Telephone Encounter (Signed)
Called pharmacy and had rx refilled for patient, no further questions.

## 2022-04-01 ENCOUNTER — Encounter: Payer: Self-pay | Admitting: Family Medicine

## 2022-04-01 ENCOUNTER — Ambulatory Visit (INDEPENDENT_AMBULATORY_CARE_PROVIDER_SITE_OTHER): Payer: Commercial Managed Care - PPO | Admitting: Family Medicine

## 2022-04-01 VITALS — HR 79 | Temp 98.3°F | Wt 166.8 lb

## 2022-04-01 DIAGNOSIS — C569 Malignant neoplasm of unspecified ovary: Secondary | ICD-10-CM | POA: Diagnosis not present

## 2022-04-01 DIAGNOSIS — Z1231 Encounter for screening mammogram for malignant neoplasm of breast: Secondary | ICD-10-CM | POA: Diagnosis not present

## 2022-04-01 NOTE — Progress Notes (Signed)
Pulse 79   Temp 98.3 F (36.8 C)   Wt 166 lb 12.8 oz (75.7 kg)   SpO2 98%   BMI 25.36 kg/m    Subjective:    Patient ID: Diane Acosta, female    DOB: 08-27-1960, 61 y.o.   MRN: [id number]  HPI: Diane Acosta is a 61 y.o. female  Chief Complaint  Patient presents with   Ovarian Cancer    Patient states she would like to talk about her cancer coming back, currently is working with oncology.    Diane Acosta presents today after finding out that her CA125 has gone up and the spot on her colon looked brighter on her PET scan. She is obviously upset, but she also is concerned about the way in which this was conveyed to her. She thinks that the radiologist was upset that she hadn't had her colonoscopy yet and that her oncologist did not relay the information in the best way. She has been trying to get scheduled for a colonoscopy, but they won't schedule her until she gets a note in her chart about holding her eliquis. She is feeling OK right now. Her granddaughter is doing well. No other concerns or complaints at this time.   Relevant past medical, surgical, family and social history reviewed and updated as indicated. Interim medical history since our last visit reviewed. Allergies and medications reviewed and updated.  Review of Systems  Constitutional: Negative.   Respiratory: Negative.    Cardiovascular: Negative.   Gastrointestinal: Negative.   Musculoskeletal: Negative.   Psychiatric/Behavioral:  Negative for agitation, behavioral problems, confusion, decreased concentration, dysphoric mood, hallucinations, self-injury, sleep disturbance and suicidal ideas. The patient is nervous/anxious. The patient is not hyperactive.     Per HPI unless specifically indicated above     Objective:    Pulse 79   Temp 98.3 F (36.8 C)   Wt 166 lb 12.8 oz (75.7 kg)   SpO2 98%   BMI 25.36 kg/m   Wt Readings from Last 3 Encounters:  04/01/22 166 lb 12.8 oz (75.7 kg)  09/29/21 169 lb  (76.7 kg)  04/01/21 160 lb 9.6 oz (72.8 kg)    Physical Exam Vitals and nursing note reviewed.  Constitutional:      General: She is not in acute distress.    Appearance: Normal appearance. She is well-developed.  HENT:     Head: Normocephalic and atraumatic.     Right Ear: Hearing and external ear normal.     Left Ear: Hearing and external ear normal.     Nose: Nose normal.     Mouth/Throat:     Mouth: Mucous membranes are moist.     Pharynx: Oropharynx is clear.  Eyes:     General: Lids are normal. No scleral icterus.       Right eye: No discharge.        Left eye: No discharge.     Conjunctiva/sclera: Conjunctivae normal.  Pulmonary:     Effort: Pulmonary effort is normal. No respiratory distress.  Musculoskeletal:        General: Normal range of motion.  Skin:    Coloration: Skin is not jaundiced or pale.     Findings: No bruising, erythema, lesion or rash.  Neurological:     Mental Status: She is alert. Mental status is at baseline. She is disoriented.  Psychiatric:        Mood and Affect: Mood normal.        Speech: Speech normal.  Behavior: Behavior normal.        Thought Content: Thought content normal.        Judgment: Judgment normal.     Results for orders placed or performed in visit on 04/01/21  Microscopic Examination   Urine  Result Value Ref Range   WBC, UA 0-5 0 - 5 /hpf   RBC, Urine None seen 0 - 2 /hpf   Epithelial Cells (non renal) 0-10 0 - 10 /hpf   Mucus, UA Present (A) Not Estab.   Bacteria, UA None seen None seen/Few  HM MAMMOGRAPHY  Result Value Ref Range   HM Mammogram 0-4 Bi-Rad 0-4 Bi-Rad, Self Reported Normal  Urinalysis, Routine w reflex microscopic  Result Value Ref Range   Specific Gravity, UA 1.020 1.005 - 1.030   pH, UA 6.0 5.0 - 7.5   Color, UA Yellow Yellow   Appearance Ur Clear Clear   Leukocytes,UA 1+ (A) Negative   Protein,UA Negative Negative/Trace   Glucose, UA Negative Negative   Ketones, UA Negative Negative    RBC, UA Negative Negative   Bilirubin, UA Negative Negative   Urobilinogen, Ur 0.2 0.2 - 1.0 mg/dL   Nitrite, UA Negative Negative   Microscopic Examination See below:   Microalbumin, Urine Waived  Result Value Ref Range   Microalb, Ur Waived 30 (H) 0 - 19 mg/L   Creatinine, Urine Waived 200 10 - 300 mg/dL   Microalb/Creat Ratio <30 <30 mg/g      Assessment & Plan:   Problem List Items Addressed This Visit       Endocrine   Malignant neoplasm of ovary ([place])    Unfortunately, her numbers are going up again. Most recent PET showed something on her colon and she is working on getting a colonoscopy. Will reach out to [facility] regarding her eliquis which can be stopped for colonoscopy. Continue to follow with oncology. To have 2nd opinion at [facility]. Call with any concerns.       Relevant Medications   LORazepam (ATIVAN) 0.5 MG tablet   Other Visit Diagnoses     Encounter for screening mammogram for malignant neoplasm of breast    -  Primary   Mammogram ordered today.    Relevant Orders   MM 3D SCREEN BREAST BILATERAL        Follow up plan: No follow-ups on file.  >30 minutes spent with patient today.

## 2022-04-05 ENCOUNTER — Encounter: Payer: Self-pay | Admitting: Family Medicine

## 2022-04-06 ENCOUNTER — Ambulatory Visit: Payer: Commercial Managed Care - PPO | Admitting: Family Medicine

## 2022-04-06 NOTE — Telephone Encounter (Signed)
Can we call and see if we can move this forward- there is a letter on her mychart and we can fax it to them as well but we are not in [place] so they probably won't be able to see it.

## 2022-04-09 ENCOUNTER — Encounter: Payer: Self-pay | Admitting: Family Medicine

## 2022-04-09 NOTE — Assessment & Plan Note (Signed)
Unfortunately, her numbers are going up again. Most recent PET showed something on her colon and she is working on getting a colonoscopy. Will reach out to [facility] regarding her eliquis which can be stopped for colonoscopy. Continue to follow with oncology. To have 2nd opinion at [facility]. Call with any concerns.

## 2022-04-18 ENCOUNTER — Encounter: Payer: Self-pay | Admitting: Family Medicine

## 2022-04-18 ENCOUNTER — Ambulatory Visit (INDEPENDENT_AMBULATORY_CARE_PROVIDER_SITE_OTHER): Payer: Commercial Managed Care - PPO | Admitting: Family Medicine

## 2022-04-18 VITALS — BP 152/76 | HR 85 | Temp 98.4°F | Ht 66.5 in | Wt 168.8 lb

## 2022-04-18 DIAGNOSIS — Z0000 Encounter for general adult medical examination without abnormal findings: Secondary | ICD-10-CM

## 2022-04-18 MED ORDER — METOPROLOL SUCCINATE ER 25 MG PO TB24: ORAL_TABLET | ORAL | 1 refills | Status: DC

## 2022-04-18 NOTE — Progress Notes (Signed)
BP (!) 152/76   Pulse 85   Temp 98.4 F (36.9 C)   Ht 5' 6.5" (1.689 m)   Wt 168 lb 12.8 oz (76.6 kg)   SpO2 99%   BMI 26.84 kg/m    Subjective:    Patient ID: Diane Acosta, female    DOB: 11-05-1960, 61 y.o.   MRN: [id number]  HPI: Diane Acosta is a 61 y.o. female presenting on 04/18/2022 for comprehensive medical examination. Current medical complaints include:none  She currently lives with: Menopausal Symptoms: no  Depression Screen done today and results listed below:     04/18/2022    1:19 PM 04/01/2022    8:53 AM 09/29/2021   10:30 AM 04/01/2021   10:07 AM 12/30/2020    9:46 AM  Depression screen PHQ 2/9  Decreased Interest 0 0 0 0 0  Down, Depressed, Hopeless 0 0 0 0 0  PHQ - 2 Score 0 0 0 0 0  Altered sleeping 0 0 0 0   Tired, decreased energy 0 0 0 0   Change in appetite 0 0 0 0   Feeling bad or failure about yourself  0 0 0 0   Trouble concentrating 0 0 0 0   Moving slowly or fidgety/restless 0 0 0 0   Suicidal thoughts 0 0 0 0   PHQ-9 Score 0 0 0 0   Difficult doing work/chores Not difficult at all Not difficult at all  Not difficult at all     Past Medical History:  Past Medical History:  Diagnosis Date   Asthma    Blood clot in vein     Surgical History:  Past Surgical History:  Procedure Laterality Date   APPENDECTOMY     TOTAL ABDOMINAL HYSTERECTOMY W/ BILATERAL SALPINGOOPHORECTOMY      Medications:  Current Outpatient Medications on File Prior to Visit  Medication Sig   apixaban (ELIQUIS) 5 MG TABS tablet TAKE 1 TABLET(5 MG) BY MOUTH TWICE DAILY   cromolyn (GASTROCROM) 100 MG/5ML solution Take 200 mg by mouth 4 (four) times daily as needed.   EPINEPHrine 0.3 mg/0.3 mL IJ SOAJ injection Inject 0.3 mg into the muscle as needed for anaphylaxis.   famotidine (PEPCID) 20 MG tablet Take 20 mg by mouth 2 (two) times daily.   LORazepam (ATIVAN) 0.5 MG tablet Take 0.5 mg by mouth 3 (three) times daily as needed.   amitriptyline (ELAVIL)  10 MG tablet Take 10 mg by mouth at bedtime. (Patient not taking: Reported on 04/18/2022)   No current facility-administered medications on file prior to visit.    Allergies:  Allergies  Allergen Reactions   Alpha-Gal Diarrhea, Hives, Anaphylaxis, Itching, Rash and Swelling    Other reaction(s): Other (See Comments) Wheezing    Hydralazine Hcl Anaphylaxis   Levofloxacin     Other reaction(s): Other (See Comments) Hypertensive emergency. Other reaction(s): Other (See Comments) Reaction: Hypertensive emergency.    Social History:  Social History   Socioeconomic History   Marital status: Married    Spouse name: Not on file   Number of children: 2   Years of education: Not on file   Highest education level: Not on file  Occupational History   Not on file  Tobacco Use   Smoking status: Never   Smokeless tobacco: Never  Vaping Use   Vaping Use: Never used  Substance and Sexual Activity   Alcohol use: Yes    Alcohol/week: 0.0 standard drinks of alcohol    Comment:  rare   Drug use: No   Sexual activity: Not on file  Other Topics Concern   Not on file  Social History Narrative   Not on file   Social Determinants of Health   Financial Resource Strain: Not on file  Food Insecurity: Not on file  Transportation Needs: Not on file  Physical Activity: Not on file  Stress: Not on file  Social Connections: Not on file  Intimate Partner Violence: Not on file   Social History   Tobacco Use  Smoking Status Never  Smokeless Tobacco Never   Social History   Substance and Sexual Activity  Alcohol Use Yes   Alcohol/week: 0.0 standard drinks of alcohol   Comment: rare    Family History:  Family History  Problem Relation Age of Onset   Thyroid disease Mother    Mental illness Mother    Diverticulitis Mother    Diverticulitis Father    Heart disease Father    Hypertension Father    Stroke Paternal Grandmother    Stroke Paternal Grandfather     Past medical  history, surgical history, medications, allergies, family history and social history reviewed with patient today and changes made to appropriate areas of the chart.   Review of Systems  Constitutional: Negative.   HENT: Negative.    Eyes: Negative.   Respiratory: Negative.    Cardiovascular: Negative.   Gastrointestinal:  Positive for blood in stool (occasionally- thinks she has a hemorrhoid). Negative for abdominal pain, constipation, diarrhea, heartburn, melena, nausea and vomiting.  Genitourinary: Negative.   Musculoskeletal: Negative.   Skin: Negative.   Neurological: Negative.   Endo/Heme/Allergies:  Negative for environmental allergies and polydipsia. Bruises/bleeds easily.  Psychiatric/Behavioral: Negative.     All other ROS negative except what is listed above and in the HPI.      Objective:    BP (!) 152/76   Pulse 85   Temp 98.4 F (36.9 C)   Ht 5' 6.5" (1.689 m)   Wt 168 lb 12.8 oz (76.6 kg)   SpO2 99%   BMI 26.84 kg/m   Wt Readings from Last 3 Encounters:  04/18/22 168 lb 12.8 oz (76.6 kg)  04/01/22 166 lb 12.8 oz (75.7 kg)  09/29/21 169 lb (76.7 kg)    Physical Exam Vitals and nursing note reviewed.  Constitutional:      General: She is not in acute distress.    Appearance: Normal appearance. She is not ill-appearing, toxic-appearing or diaphoretic.  HENT:     Head: Normocephalic and atraumatic.     Right Ear: Tympanic membrane, ear canal and external ear normal. There is no impacted cerumen.     Left Ear: Tympanic membrane, ear canal and external ear normal. There is no impacted cerumen.     Nose: Nose normal. No congestion or rhinorrhea.     Mouth/Throat:     Mouth: Mucous membranes are moist.     Pharynx: Oropharynx is clear. No oropharyngeal exudate or posterior oropharyngeal erythema.  Eyes:     General: No scleral icterus.       Right eye: No discharge.        Left eye: No discharge.     Extraocular Movements: Extraocular movements intact.      Conjunctiva/sclera: Conjunctivae normal.     Pupils: Pupils are equal, round, and reactive to light.  Neck:     Vascular: No carotid bruit.  Cardiovascular:     Rate and Rhythm: Normal rate and regular rhythm.  Pulses: Normal pulses.     Heart sounds: No murmur heard.    No friction rub. No gallop.  Pulmonary:     Effort: Pulmonary effort is normal. No respiratory distress.     Breath sounds: Normal breath sounds. No stridor. No wheezing, rhonchi or rales.  Chest:     Chest wall: No tenderness.  Abdominal:     General: Abdomen is flat. Bowel sounds are normal. There is no distension.     Palpations: Abdomen is soft. There is no mass.     Tenderness: There is no abdominal tenderness. There is no right CVA tenderness, left CVA tenderness, guarding or rebound.     Hernia: No hernia is present.  Genitourinary:    Comments: Breast and pelvic exams deferred with shared decision making Musculoskeletal:        General: No swelling, tenderness, deformity or signs of injury.     Cervical back: Normal range of motion and neck supple. No rigidity. No muscular tenderness.     Right lower leg: No edema.     Left lower leg: No edema.  Lymphadenopathy:     Cervical: No cervical adenopathy.  Skin:    General: Skin is warm and dry.     Capillary Refill: Capillary refill takes less than 2 seconds.     Coloration: Skin is not jaundiced or pale.     Findings: No bruising, erythema, lesion or rash.  Neurological:     General: No focal deficit present.     Mental Status: She is alert and oriented to person, place, and time. Mental status is at baseline.     Cranial Nerves: No cranial nerve deficit.     Sensory: No sensory deficit.     Motor: No weakness.     Coordination: Coordination normal.     Gait: Gait normal.     Deep Tendon Reflexes: Reflexes normal.  Psychiatric:        Mood and Affect: Mood normal.        Behavior: Behavior normal.        Thought Content: Thought content normal.         Judgment: Judgment normal.     Results for orders placed or performed in visit on 04/01/21  Microscopic Examination   Urine  Result Value Ref Range   WBC, UA 0-5 0 - 5 /hpf   RBC, Urine None seen 0 - 2 /hpf   Epithelial Cells (non renal) 0-10 0 - 10 /hpf   Mucus, UA Present (A) Not Estab.   Bacteria, UA None seen None seen/Few  HM MAMMOGRAPHY  Result Value Ref Range   HM Mammogram 0-4 Bi-Rad 0-4 Bi-Rad, Self Reported Normal  Urinalysis, Routine w reflex microscopic  Result Value Ref Range   Specific Gravity, UA 1.020 1.005 - 1.030   pH, UA 6.0 5.0 - 7.5   Color, UA Yellow Yellow   Appearance Ur Clear Clear   Leukocytes,UA 1+ (A) Negative   Protein,UA Negative Negative/Trace   Glucose, UA Negative Negative   Ketones, UA Negative Negative   RBC, UA Negative Negative   Bilirubin, UA Negative Negative   Urobilinogen, Ur 0.2 0.2 - 1.0 mg/dL   Nitrite, UA Negative Negative   Microscopic Examination See below:   Microalbumin, Urine Waived  Result Value Ref Range   Microalb, Ur Waived 30 (H) 0 - 19 mg/L   Creatinine, Urine Waived 200 10 - 300 mg/dL   Microalb/Creat Ratio <30 <30 mg/g  Assessment & Plan:   Problem List Items Addressed This Visit   None Visit Diagnoses     Routine general medical examination at a health care facility    -  Primary   Vaccines up to date. Screening labs checked today. Pap N/A. Mammo ordered. Colonoscopy scheduled. Continue to monitor. Call with any concerns.         Follow up plan: Return in about 6 months (around 10/18/2022).   LABORATORY TESTING:  - Pap smear: not applicable  IMMUNIZATIONS:   - Tdap: Tetanus vaccination status reviewed: last tetanus booster within 10 years. - Influenza: Refused - Pneumovax: Up to date - Prevnar: Not applicable - COVID: Up to date - HPV: Not applicable - Shingrix vaccine: Not applicable  SCREENING: -Mammogram: Ordered today  - Colonoscopy:  Scheduled today    PATIENT COUNSELING:    Advised to take 1 mg of folate supplement per day if capable of pregnancy.   Sexuality: Discussed sexually transmitted diseases, partner selection, use of condoms, avoidance of unintended pregnancy  and contraceptive alternatives.   Advised to avoid cigarette smoking.  I discussed with the patient that most people either abstain from alcohol or drink within safe limits (<=14/week and <=4 drinks/occasion for males, <=7/weeks and <= 3 drinks/occasion for females) and that the risk for alcohol disorders and other health effects rises proportionally with the number of drinks per week and how often a drinker exceeds daily limits.  Discussed cessation/primary prevention of drug use and availability of treatment for abuse.   Diet: Encouraged to adjust caloric intake to maintain  or achieve ideal body weight, to reduce intake of dietary saturated fat and total fat, to limit sodium intake by avoiding high sodium foods and not adding table salt, and to maintain adequate dietary potassium and calcium preferably from fresh fruits, vegetables, and low-fat dairy products.    stressed the importance of regular exercise  Injury prevention: Discussed safety belts, safety helmets, smoke detector, smoking near bedding or upholstery.   Dental health: Discussed importance of regular tooth brushing, flossing, and dental visits.    NEXT PREVENTATIVE PHYSICAL DUE IN 1 YEAR. Return in about 6 months (around 10/18/2022).

## 2022-04-21 ENCOUNTER — Other Ambulatory Visit: Payer: Self-pay | Admitting: Family Medicine

## 2022-04-21 NOTE — Telephone Encounter (Signed)
Unable to refill per protocol, last refill by  Provider 04/18/22 for 180 and 1 RF.E-Prescribing Status: Receipt confirmed by pharmacy (04/18/2022 1:32 PM EDT). Will refuse.  Requested Prescriptions  Pending Prescriptions Disp Refills  . metoprolol succinate (TOPROL-XL) 25 MG 24 hr tablet [Pharmacy Med Name: METOPROLOL ER SUCCINATE '25MG'$  TABS] 180 tablet 1    Sig: TAKE 1/2 TO 1 TABLET(12.5 TO 25 MG) BY MOUTH TWICE DAILY AS NEEDED     Cardiovascular:  Beta Blockers Failed - 04/21/2022  3:13 AM      Failed - Last BP in normal range    BP Readings from Last 1 Encounters:  04/18/22 (!) 152/76         Passed - Last Heart Rate in normal range    Pulse Readings from Last 1 Encounters:  04/18/22 85         Passed - Valid encounter within last 6 months    Recent Outpatient Visits          3 days ago Routine general medical examination at a health care facility   [facility] [place], [place], DO   2 weeks ago Encounter for screening mammogram for malignant neoplasm of breast   [place], Megan P, DO   6 months ago Hypercholesterolemia   [place], [place], DO   1 year ago Routine general medical examination at a health care facility   [place], [place], DO   1 year ago Essential hypertension   [facility] Valerie Roys, DO      Future Appointments            In 6 months Wynetta Emery, Barb Merino, DO [facility], [place]   In 12 months Wynetta Emery, Barb Merino, DO [facility], [facility]

## 2022-04-22 ENCOUNTER — Encounter: Payer: Self-pay | Admitting: Family Medicine

## 2022-05-27 ENCOUNTER — Telehealth: Payer: Self-pay | Admitting: Obstetrics and Gynecology

## 2022-05-27 NOTE — Telephone Encounter (Signed)
Returned pt call in regards to appt on 11/22, LVM with appt information

## 2022-06-01 ENCOUNTER — Inpatient Hospital Stay: Payer: Commercial Managed Care - PPO | Attending: Obstetrics and Gynecology | Admitting: Obstetrics and Gynecology

## 2022-06-01 VITALS — BP 145/78 | HR 84 | Temp 98.7°F | Resp 20 | Wt 167.5 lb

## 2022-06-01 DIAGNOSIS — Z7189 Other specified counseling: Secondary | ICD-10-CM | POA: Diagnosis not present

## 2022-06-01 DIAGNOSIS — K56600 Partial intestinal obstruction, unspecified as to cause: Secondary | ICD-10-CM | POA: Diagnosis not present

## 2022-06-01 DIAGNOSIS — C569 Malignant neoplasm of unspecified ovary: Secondary | ICD-10-CM | POA: Diagnosis not present

## 2022-06-01 DIAGNOSIS — Z9221 Personal history of antineoplastic chemotherapy: Secondary | ICD-10-CM | POA: Diagnosis not present

## 2022-06-01 NOTE — Progress Notes (Signed)
Gynecologic Oncology Interval Visit   Referring Provider: Primary Gynecologic Oncologist: Diane Acosta  Chief Concern: Second opinion for ovarian cancer  Subjective:  Diane Acosta is a 61 y.o. G73P2 female who is seen in consultation from Dr. Colonel Acosta for second opinion for management of recurrent stage IC endometrioid ovarian cancer. She presents to clinic with her husband, Diane Acosta, of ~40 years. They are highschool sweethearts.   Since her last visit with [place] she opted for a chemotherapy holiday.  Her last chemotherapy was in April 2022.  She has had excellent quality of life since and has been followed closely by Dr. Waldron Acosta jump with surveillance and serial imaging. She presents today for Second opinion for progressive ovarian cancer with an asymptomatic partial large bowel obstruction.    05/16/2022 colonoscopy  The perianal and digital rectal examinations were normal.     A malignant-appearing, intrinsic moderate stenosis measuring 5 cm (in length) x 1 cm (inner diameter) was found in the recto-sigmoid colon  (10-15 cm from anus) and was traversed. Biopsies were taken with a cold forceps for histology.     Multiple small-mouthed diverticula were found in the sigmoid colon and descending colon. The exam was otherwise without abnormality.   A: Colon, sigmoid, biopsy - Adenocarcinoma, consistent with know history of endometrioid carcinoma. Immunohistochemical stains are positive for CK7, ER, and PR; PAX8 shows focal positive staining; stains for CK20, CDX2, and WT1 are negative; P53 shows wild-type staining. The immunohistochemical staining profile supports the above diagnosis.   PET scan obtained.   Dr. Colonel Acosta called the patient on 05/24/22 with her PET scan results:  IMPRESSION: -Interval increase in prominence of soft tissue nodule adjacent to the left psoas muscle with increased hypermetabolic activity over the past 20 months, concerning for disease progression.  -Focal  hypermetabolic activity in a thickened segment of the sigmoid colon has waxed and waned but remains strongly avid, recommend further evaluation with colonoscopy or sigmoidoscopy.  -Uptake in the right breast is not significant at the present time. Mammography could be performed when clinically indicated.  They discussed surgery as the next possible step in care (with Dr. Carlis Acosta) versus platinum/taxane chemotherapy. The risks and benefits of each approach was reviewed, including risk of ostomy for surgery and risk of malignant obstruction if no surgery.   She is interested in surgery and has an appointment with Dr. Carlis Acosta for an OR date and MRI scheduled for 06/17/22.  She is also interested in discussing other options other than surgery.  She states she has had prior radiation therapy   Gynecologic Oncology History  She initially presented with abdominal bloating and the findings of a 23 x 13 x 24 cm solid and cystic pelvic mass. She underwent exploratory laparotomy February 02, 2010. She is found to have a endometrioid ovarian cancer. Complete surgical staging was performed ultimately staged as a stage IC grade 1 endometrioid ovarian cancer associated with endometriosis. She received 3 cycles of carboplatin and Taxol postoperatively completed April 08, 2010.  She was followed free of disease until CA-125 began to rise in September 2015. In October 2015 a CT scan showed recurrent disease predominantly in the pelvis and along the right psoas muscle. This is confirmed by fine needle aspirate.  The patient underwent secondary cytoreductive surgery on May 28, 2014. All gross tumor was resected. Final pathology showed an endometrioid adenocarcinoma with squamous differentiation 1 specimen was poorly differentiated.  She initiated second line chemotherapy using carboplatin and Avastin and Taxol. After 6  cycles her CA-125 was 2.2 CT scan was negative (October 28, 2014).She continued Avastin therapy.  Avastin was completed in March 2017.  In January 2018 the patient's CA-125 rose slightly (13 as per mL) and on imaging noted to have her right pelvic sidewall as well as sigmoid colon. This was treated with whole pelvis radiation therapy to a dose of 50.4 Gy. Radiation was completed in June 2018.  CA-125 bumped to 11.8 in March 2021. PET scan was performed consistent with recurrent disease.  10/2019 PET  IMPRESSION: - Intensely avid soft tissue nodule in the left pelvis along the psoas muscle concerning for a hypermetabolic retroperitoneal lymph node and metastatic disease.    -New subcentimeter pulmonary nodules with the larger left upper lobe pulmonary nodule demonstrating mild uptake concerning for metastatic disease.   - Focal uptake in the sigmoid colon corresponding to ill-defined soft tissue thickening. Recommend further evaluation with colonoscopy.  Patient received 3 cycles of cisplatin/gemzar - follow-up PET scan showed:   02/01/2020 During treatment, patient was diagnosed with a right subclavian vein thrombosis and is currently on eliquis. She has required Port removal and then replacement and had stents placed in vessels.   02/26/2020 PET IMPRESSION: - Left pelvic hypermetabolic soft tissue nodule is unchanged, concerning for metastatic disease.   - Left upper lobe pulmonary nodule with mild FDG uptake is also unchanged, concerning for metastases disease.   - Unchanged focal uptake in the sigmoid colon corresponding to ill-defined soft tissue thickening. Recommend further evaluation with colonoscopy.   02/26/2020 Chest/A/P CT -Interval decreased conspicuity of the previously noted thrombus along the distal right subclavian vein, difficult to assess due to background motion and streak artifacts.   -Previously described hypodensity at the right subclavian vein is not well assessed due to timing of contrast injection.   -No new or enlarging sites of metastatic disease.  -Left  pelvic soft tissue nodule/implant is slightly decreased in size. This lesion demonstrates radiotracer uptake on concurrent PET.  -Wall thickening of distal ileal bowel loops, unchanged from several prior exams and may represent sequelae of radiation  -No focal areas of wall thickening or inflammatory changes within the colon to correlate with foci of uptake on concurrent current PET.  -Refer to same-day CT chest for evaluation above the diaphragm.  05/09/2020 Chest CT  Resolution of right lower lobe pulmonary nodule and interval decrease in size of subpleural left upper lobe nodule.   05/26/2020 PET/CT  - Interval slight decrease in the size and FDG uptake of the hypermetabolic soft tissue nodule which remains persistently hypermetabolic. Still concerning for metastatic disease.   -Interval decrease in size and FDG uptake of the persistently hypermetabolic focal thickening in the sigmoid colon. This still remains suspicious for potential metastatic disease.   -Interval decrease in the size and FDG uptake of these pleural nodule in the left upper lobe and interval resolution of the non-FDG avid pleural nodule in the right lower lobe.   09/21/2020 PET/CT  Lungs and Pleura: Stable non-FDG avid left upper lobe subpleural micronodule, too small for PET. No pleural effusion. Bibasilar subsegmental atelectasis. Mild bilateral posterior pleural thickening.   Liver: A few small subcentimeter foci of increased FDG activity within the right hepatic lobe without corresponding CT abnormality (for reference: CT 153, 157 and 160).   Mildly decreased FDG uptake of the soft tissue nodule adjacent to the left psoas muscle measuring 1.5 cm, previously 1.9 cm (CT 222). Stable 2.5 cm non-FDG avid right lower quadrant fluid-filled collection with  peripheral calcification (CT 237), likely a small seroma or lymphocele.   Interval mild decrease in size and FDG uptake of the soft tissue nodule adjacent to left psoas  muscle.   -Continued interval decrease in FDG uptake involving the focally thickened sigmoid colon. This is indeterminate but less suspicious for metastatic disease/malignancy given interval improvement.   -Multiple subcentimeter foci of indeterminate FDG activity within the right hepatic lobe without corresponding CT abnormality. Indeterminate. Attention on short-term follow-up PET/CT (12 weeks).  She has received 9 cycles of cisplatin/gemzar with response to treatment - decrease in activity and size of sidewall and colon nodule, resolution of disease in lung, new concern for disease in liver but there is not definitive evidence of disease. Dr. Colonel Acosta counseled her about options for treatment included continued gemzar/cisplatin, cisplatin only versus break from treatment. She has required multiple transfusions (6x) since 06/11/2020 for anemia. Other side effects include neuropathy (she had dropped glasses and has imbalance); fatigue requiring a steroid taper, weakness, and labile BPs (which I suspect is due to anemia).   She opted to come off combination gem/cisplatin therapy and continue with single agent cisplatin alone with plan to repeat PET imaging after 3 cycles of treatment.   10/15/2020 cycle #1 of cisplatin.      Tumor testing: Tumor mutation profile was MSS, +CTNNB1 and ARID1A mutation based on [facility] in 2015.   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   Abnormal positron emission tomography (PET) of colon 09/24/2016   Carotid stenosis 05/05/2016   Hypercholesterolemia 04/28/2016   Atherosclerosis of right carotid artery 03/28/2016    Peripheral neuropathy due to chemotherapy ([place]) 07/14/2015   Malignant neoplasm of ovary ([place]) 01/07/2015   Asthma 01/07/2015   Essential hypertension 08/21/2014   Ovarian carcinoma, epithelial ([place]) 01/09/2013    Past Medical History: Past Medical History:  Diagnosis Date   Asthma    Blood clot in vein     Past Surgical History: Past Surgical History:  Procedure Laterality Date   APPENDECTOMY     TOTAL ABDOMINAL HYSTERECTOMY W/ BILATERAL SALPINGOOPHORECTOMY      Past Gynecologic History:  As per HPI  OB History:  OB History  No obstetric history on file.    Family History: Family History  Problem Relation Age of Onset   Thyroid disease Mother    Mental illness Mother    Diverticulitis Mother    Diverticulitis Father    Heart disease Father    Hypertension Father    Stroke Paternal Grandmother    Stroke Paternal Grandfather     Social History: Social History   Socioeconomic History   Marital status: Married    Spouse name: Not on file   Number of children: 2   Years of education: Not on file   Highest education level: Not on file  Occupational History   Not on file  Tobacco Use   Smoking status: Never   Smokeless tobacco: Never  Vaping Use   Vaping Use: Never used  Substance and Sexual Activity   Alcohol use: Yes    Alcohol/week: 0.0 standard drinks of alcohol    Comment: rare   Drug use: No   Sexual activity: Not on file  Other Topics Concern   Not on file  Social History Narrative   Not on file   Social Determinants of Health   Financial Resource Strain: Not on file  Food Insecurity: Not on file  Transportation Needs: Not on file  Physical Activity: Not on file  Stress: Not on file  Social Connections: Not on file  Intimate Partner Violence: Not on file    Allergies: Allergies  Allergen Reactions   Alpha-Gal Diarrhea, Hives, Anaphylaxis, Itching, Rash and Swelling    Other reaction(s): Other (See Comments) Wheezing    Hydralazine  Hcl Anaphylaxis   Levofloxacin     Other reaction(s): Other (See Comments) Hypertensive emergency. Other reaction(s): Other (See Comments) Reaction: Hypertensive emergency.    Current Medications: Current Outpatient Medications  Medication Sig Dispense Refill   apixaban (ELIQUIS) 5 MG TABS tablet TAKE 1 TABLET(5 MG) BY MOUTH TWICE DAILY 180 tablet 3   cromolyn (GASTROCROM) 100 MG/5ML solution Take 200 mg by mouth 4 (four) times daily as needed.     EPINEPHrine 0.3 mg/0.3 mL IJ SOAJ injection Inject 0.3 mg into the muscle as needed for anaphylaxis. 2 each 12   famotidine (PEPCID) 20 MG tablet Take 20 mg by mouth 2 (two) times daily.     LORazepam (ATIVAN) 0.5 MG tablet Take 0.5 mg by mouth 3 (three) times daily as needed.     metoprolol succinate (TOPROL-XL) 25 MG 24 hr tablet TAKE 1/2 TO 1 TABLET(12.5 TO 25 MG) BY MOUTH TWICE DAILY AS NEEDED 180 tablet 1   amitriptyline (ELAVIL) 10 MG tablet Take 10 mg by mouth at bedtime. (Patient not taking: Reported on 04/18/2022)     No current facility-administered medications for this visit.    Review of Systems General: no complaints  HEENT: no complaints  Lungs: no complaints  Cardiac: no complaints  GI: no complaints  GU: no complaints  Musculoskeletal: no complaints  Extremities: no complaints  Skin: no complaints  Neuro: no complaints  Endocrine: no complaints  Psych: no complaints      Objective:  Physical Examination:  BP (!) 145/78   Pulse 84   Temp 98.7 F (37.1 C)   Resp 20   Wt 167 lb 8 oz (76 kg)   SpO2 100%   BMI 26.63 kg/m    ECOG Performance Status: 0 - Asymptomatic  GENERAL: Patient is a well appearing female in no acute distress She declined an exam.    Labs  Ref Range & Units 2 mo ago  CA 125 0 - 35 U/mL 21    Radiologic Imaging: Impression  -Interval increase in prominence of soft tissue nodule adjacent to the left psoas muscle with increased hypermetabolic activity over the past 20 months,  concerning for disease progression.  -Focal hypermetabolic activity in a thickened segment of the sigmoid colon has waxed and waned but remains strongly avid, recommend further evaluation with colonoscopy or sigmoidoscopy.  -Uptake in the right breast is not significant at the present time. Mammography could be performed when clinically indicated. Narrative  EXAM: PET CT SKULL BASE TO THIGH DATE: 05/24/2022 8:32 AM ACCESSION: [id number] UN DICTATED: 05/24/2022 10:21 AM INTERPRETATION LOCATION: MAIN CAMPUS  CLINICAL INDICATION: 61 years old Female: ovarian ca, eval disease metastasis ; Metastatic disease evaluation  - C56.9 - Ovarian epithelial cancer, unspecified laterality (CMS - [place]).    Subsequent    treatment strategy.  RADIOPHARMACEUTICAL: F-18 Fluorodeoxyglucose (FDG), IV  TECHNIQUE: Following the administration of radiopharmaceutical, PET images were acquired using 3D-acquisition and reconstructed  with attenuation-correction.  A single-breathhold CT scan was obtained at quiet end-expiration without oral or IV contrast for anatomic localization and attenuation-correction. The coregistered PET and CT images were evaluated in axial, coronal, and sagittal planes.  Scanner: Siemens Biograph mCT Injected activity: 11.7 mCi Uptake time: 57 minutes Site of injection: Chemotherapy port Serum glucose: 102 mg/dL  COMPARISON: 03/10/2022, 10/28/2021, 07/29/2021, 03/01/2021, and 09/21/2020  FINDINGS: HEAD, NECK and SUPRACLAVICULAR REGIONS: No suspicious hypermetabolic lesions or lymph nodes.  CHEST: Thyroid: Grossly unremarkable. Breasts: Uptake in the right breast is not significant at the present time. Lungs and Pleura: No suspicious hypermetabolic nodules. No pleural effusion. Mediastinum, Hila and Axillae: No suspicious hypermetabolic lymph nodes. Diffuse uptake in the distal esophagus is decreased from prior. Cardiovascular: Coronary calcifications. No pericardial effusion. Chemotherapy  port terminates in the cavoatrial junction. Left brachiocephalic and SVC stents are present.  ABDOMEN and PELVIS: Liver: No suspicious hypermetabolic lesions. Gallbladder: No cholelithiasis. Spleen: No suspicious hypermetabolic lesions or diffuse uptake. No splenomegaly. Pancreas: No suspicious hypermetabolic lesions. GI/Peritoneum/Mesentery: Focal increased metabolic activity in the sigmoid corresponding to wall thickening on CT, marginally more avid than on prior (SUV max 8.5, increased from 7.6, and in turn decreased from 9.4, 13.9, 5.6, and 2.7). Adrenals: Lobulated appearance of the superolateral right kidney is unchanged from prior and not abnormally avid. Kidneys: Grossly unremarkable. GU: Grossly unremarkable. Adenopathy: No suspicious hypermetabolic lymph nodes. Vasculature: Scattered atherosclerotic calcification of the abdominal aorta and its major branches.  MUSCULOSKELETAL: Soft tissue nodule adjacent to the left psoas muscle is marginally more avid than on prior (SUV max 7.0, increased from 5.3, and in turn from 4.8, 2.7, 2.8, and 2.4). Uptake at the left genu clavicular joint is favored degenerative. Procedure Note  Diane Justin, MD - 05/24/2022 Formatting of this note might be different from the original. EXAM: PET CT SKULL BASE TO THIGH DATE: 05/24/2022 8:32 AM ACCESSION: [id number] UN DICTATED: 05/24/2022 10:21 AM INTERPRETATION LOCATION: MAIN CAMPUS  CLINICAL INDICATION: 61 years old Female: ovarian ca, eval disease metastasis ; Metastatic disease evaluation  - C56.9 - Ovarian epithelial cancer, unspecified laterality (CMS - [place]).    Subsequent    treatment strategy.  RADIOPHARMACEUTICAL: F-18 Fluorodeoxyglucose (FDG), IV  TECHNIQUE: Following the administration of radiopharmaceutical, PET images were acquired using 3D-acquisition and reconstructed with attenuation-correction.  A single-breathhold CT scan was obtained at quiet end-expiration without oral or IV  contrast for anatomic localization and attenuation-correction. The coregistered PET and CT images were evaluated in axial, coronal, and sagittal planes.  Scanner: Siemens Biograph mCT Injected activity: 11.7 mCi Uptake time: 57 minutes Site of injection: Chemotherapy port Serum glucose: 102 mg/dL  COMPARISON: 03/10/2022, 10/28/2021, 07/29/2021, 03/01/2021, and 09/21/2020  FINDINGS: HEAD, NECK and SUPRACLAVICULAR REGIONS: No suspicious hypermetabolic lesions or lymph nodes.  CHEST: Thyroid: Grossly unremarkable. Breasts: Uptake in the right breast is not significant at the present time. Lungs and Pleura: No suspicious hypermetabolic nodules. No pleural effusion. Mediastinum, Hila and Axillae: No suspicious hypermetabolic lymph nodes. Diffuse uptake in the distal esophagus is decreased from prior. Cardiovascular: Coronary calcifications. No pericardial effusion. Chemotherapy port terminates in the cavoatrial junction. Left brachiocephalic and SVC stents are present.  ABDOMEN and PELVIS: Liver: No suspicious hypermetabolic lesions. Gallbladder: No cholelithiasis. Spleen: No suspicious hypermetabolic lesions or diffuse uptake. No splenomegaly. Pancreas: No suspicious hypermetabolic lesions. GI/Peritoneum/Mesentery: Focal increased metabolic activity in the sigmoid corresponding to wall thickening on CT, marginally more avid than on prior (SUV max 8.5, increased from 7.6, and in turn decreased from 9.4, 13.9,  5.6, and 2.7). Adrenals: Lobulated appearance of the superolateral right kidney is unchanged from prior and not abnormally avid. Kidneys: Grossly unremarkable. GU: Grossly unremarkable. Adenopathy: No suspicious hypermetabolic lymph nodes. Vasculature: Scattered atherosclerotic calcification of the abdominal aorta and its major branches.  MUSCULOSKELETAL: Soft tissue nodule adjacent to the left psoas muscle is marginally more avid than on prior (SUV max 7.0, increased from 5.3, and in turn  from 4.8, 2.7, 2.8, and 2.4). Uptake at the left genu clavicular joint is favored degenerative.  IMPRESSION: -Interval increase in prominence of soft tissue nodule adjacent to the left psoas muscle with increased hypermetabolic activity over the past 20 months, concerning for disease progression.  -Focal hypermetabolic activity in a thickened segment of the sigmoid colon has waxed and waned but remains strongly avid, recommend further evaluation with colonoscopy or sigmoidoscopy.     Assessment:  Diane Acosta is a 61 y.o. female diagnosed with platinum-sensitive recurrent stage IC endometrioid ovarian cancer s/p cisplatin/gemcitabine switched to single agent cisplatin due to toxicity.  She has had an extended chemotherapy holiday with excellent quality of life. Now with progressive ovarian cancer with an asymptomatic partial large bowel obstruction.      Medical co-morbidities complicating care: prior abdominal surgery, VTE.  Plan:   Problem List Items Addressed This Visit       Endocrine   Malignant neoplasm of ovary ([place]) - Primary   Other Visit Diagnoses     Counseling and coordination of care           We discussed options for management including surgery versus chemotherapy.  It does not sound like further radiation treatment is an option.  I reviewed the pros and cons of each approach.  I am concerned with the partial large bowel obstruction and the degree of narrowing noted on colonoscopy that she is a risk for a complete bowel obstruction and that would be a surgical emergency.  Would be better to do surgery in a controlled setting especially if the disease was amendable to resection.  The MRI will better determine the extent of disease and involvement with adjacent structures and whether or not the disease is resectable.  We talked about an ostomy and I discussed that a diverting ostomy - after resection with reanastomosis- is often temporary and can be taken down after 3  to 6 months after ensuring the primary anastomosis has healed.   We also reviewed chemotherapy options.  It sounds like Dr. Colonel Acosta will be recommending retreatment with paclitaxel and carboplatin.  This is an acceptable alternative. There are also other platinum-based doublets.  Other options include clinical trials with mirvetuximab pending her FRalpha results.   I recommmended continued close follow up with Dr. Colonel Acosta and to see Dr. Loletta Specter. We are happy to be available as needed   and this is certainly a challenging situation.  Each approach has potential for risk and benefit.  While surgery is high risk it has the greatest opportunity for benefit and control of her disease.   The patient's diagnosis, an outline of the further diagnostic and laboratory studies which will be required, the recommendation, and alternatives were discussed.  High complexity decision making. All questions were answered to the patient's satisfaction.  A total of 45 minutes were spent with the patient/family today; >50% was spent in education, counseling and coordination of care for ovarian cancer.   Diane Zahm Gaetana Michaelis, MD

## 2022-08-15 ENCOUNTER — Telehealth: Payer: Self-pay

## 2022-08-15 NOTE — Telephone Encounter (Signed)
Transition Care Management Unsuccessful Follow-up Telephone Call  Date of discharge and from where:  [facility] 08/14/2022  Attempts:  1st Attempt  Reason for unsuccessful TCM follow-up call:  Left voice message Juanda Crumble, [place] Direct Dial [phone]

## 2022-08-16 NOTE — Telephone Encounter (Signed)
Transition Care Management Follow-up Telephone Call Date of discharge and from where: [facility] 08/14/2022 How have you been since you were released from the hospital? okay Any questions or concerns? No  Items Reviewed: Did the pt receive and understand the discharge instructions provided? Yes  Medications obtained and verified? Yes  Other? No  Any new allergies since your discharge? No  Dietary orders reviewed? Yes Do you have support at home? Yes   Home Care and Equipment/Supplies: Were home health services ordered? no If so, what is the name of the agency? N/a  Has the agency set up a time to come to the patient's home? not applicable Were any new equipment or medical supplies ordered?  No What is the name of the medical supply agency? N/a Were you able to get the supplies/equipment? no Do you have any questions related to the use of the equipment or supplies? No  Functional Questionnaire: (I = Independent and D = Dependent) ADLs: I  Bathing/Dressing- I  Meal Prep- I  Eating- I  Maintaining continence- I  Transferring/Ambulation- I  Managing Meds- I  Follow up appointments reviewed:  PCP Hospital f/u appt confirmed? No   Specialist Hospital f/u appt confirmed? Yes  Scheduled to see Dr Carlis Abbott on 08/17/2022 @ 11:00. Are transportation arrangements needed? No  If their condition worsens, is the pt aware to call PCP or go to the Emergency Dept.? Yes Was the patient provided with contact information for the PCP's office or ED? Yes Was to pt encouraged to call back with questions or concerns? Yes .lh

## 2022-09-19 ENCOUNTER — Inpatient Hospital Stay: Payer: Commercial Managed Care - PPO | Attending: Obstetrics and Gynecology | Admitting: Obstetrics and Gynecology

## 2022-09-19 VITALS — BP 125/70 | HR 83 | Resp 18 | Ht 66.5 in | Wt 159.0 lb

## 2022-09-19 DIAGNOSIS — C569 Malignant neoplasm of unspecified ovary: Secondary | ICD-10-CM | POA: Diagnosis not present

## 2022-09-19 DIAGNOSIS — Z90710 Acquired absence of both cervix and uterus: Secondary | ICD-10-CM | POA: Insufficient documentation

## 2022-09-19 DIAGNOSIS — Z934 Other artificial openings of gastrointestinal tract status: Secondary | ICD-10-CM | POA: Diagnosis not present

## 2022-09-19 DIAGNOSIS — Z7189 Other specified counseling: Secondary | ICD-10-CM

## 2022-09-19 DIAGNOSIS — Z7901 Long term (current) use of anticoagulants: Secondary | ICD-10-CM | POA: Insufficient documentation

## 2022-09-19 DIAGNOSIS — C7989 Secondary malignant neoplasm of other specified sites: Secondary | ICD-10-CM | POA: Insufficient documentation

## 2022-09-19 DIAGNOSIS — Z9221 Personal history of antineoplastic chemotherapy: Secondary | ICD-10-CM | POA: Diagnosis not present

## 2022-09-19 DIAGNOSIS — C785 Secondary malignant neoplasm of large intestine and rectum: Secondary | ICD-10-CM | POA: Diagnosis not present

## 2022-09-19 DIAGNOSIS — Z90722 Acquired absence of ovaries, bilateral: Secondary | ICD-10-CM | POA: Diagnosis not present

## 2022-09-19 DIAGNOSIS — Z86718 Personal history of other venous thrombosis and embolism: Secondary | ICD-10-CM | POA: Diagnosis not present

## 2022-09-19 NOTE — Patient Instructions (Signed)
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 After this period, many patients with SBS will still require acid suppression due to continued dyspeptic symptoms. Nonetheless, as gastric acid has a role in suppressing overgrowth of upper gut bacteria, acid-suppressing agents should be used sparingly thereafter, particularly when there is documented small intestinal bacterial overgrowth.   Antidiarrheals -- We treat all patients with either loperamide or diphenoxylate as first-line agents to reduce intestinal motility and prolong transit time. These agents also have a small impact on reducing intestinal secretion. While antimotility agents may be effective in reducing intestinal transit, in cases where bowel dilatation has occurred, antimotility agents can rarely worsen diarrhea by allowing bacterial proliferation. Therefore, their use should be monitored closely with dose escalation or discontinuation according to the response.  These medications are generally administered about 30 minutes before meals and again at bedtime. As loperamide enters the enterohepatic circulation, which is disrupted in SBS patients without an ileum, high doses are frequently needed (ie, up to 16 tablets per day). Patients on high-dose loperamide require close monitoring for adverse drug reactions and arrhythmias in particular. When ineffective, especially in those patients without a colon-in-continuity or those who are left with a minimum of residual jejunum or duodenum, we may add codeine sulfate (15 to 60 mg two to three times a day) or tincture of opium. Loperamide and codeine may have a synergistic effect when used together [19]. The use of codeine and tincture of opium tends to be limited by their sedating effect.  Additional management for patients  with high fluid losses  Antibiotics for small intestinal bacterial overgrowth -- The combination of bowel dilatation and altered transit frequently seen in SBS is thought to facilitate the development of small intestinal bacterial overgrowth (SIBO) [20]. Because of limitations in the tests used to diagnose SIBO (ie, small bowel aspirate/colony count, hydrogen breath test) in SBS, securing the diagnosis of SIBO is challenging. As such, in the presence of bowel dilation and typical symptoms (eg, gas-bloat, diarrhea, discomfort), empiric antimicrobial treatment is often provided. A variety of oral broad-spectrum antibiotics can be used with success being judged on improvement in symptoms and/or oral intake, reduction in stool output, and/or weight gain. The continuous use of low-dose, rotating cycle of antibiotics in SBS may be necessary in some patients. Antibiotic treatment regimens for SIBO are discussed in detail separately. (See "Small intestinal bacterial overgrowth: Management", section on 'Antibiotic therapy'.)  Octreotide -- The use of octreotide should be reserved for patients with intravenous fluid requirements that are greater than 3 L per day and only after the period of maximal intestinal adaptation [21]. Typical candidates include patients with SBS and a high-output end-jejunostomy. A typical starting dose is 100 mcg subcutaneously three times per day. The dose can be increased to 300 mcg three times daily. If helpful in decreasing fluid losses, a long-acting formulation can be used. Octreotide increases small bowel transit time and reduces fluid losses, but tachyphylaxis often develops. In patients with no significant reduction in stool output within four to six weeks despite titrating the dose, octreotide should be discontinued. Octreotide diminishes splanchnic protein synthesis, which can interfere with the process of adaptation [22-24]. In addition, octreotide is expensive, requires a painful  subcutaneous injection, and predisposes patients to the development of gallstones for which patients with SBS are at high risk. (See "  Chronic complications of the short bowel syndrome in adults", section on 'Cholelithiasis'.)  Other agents -- We reserve the use of clonidine and exenatide to patients with high fluid losses that are refractory to other measures. The choice is determined by concerns regarding the patient's blood pressure and the patient's willingness to administer a subcutaneous injection.  ?Clonidine - Clonidine, which can be administered both orally and transdermally, has also shown modest benefit in treating high output stool losses presumably via its effects on intestinal motility and secretion [25]. Its clinical benefit, however, has yet to be clearly demonstrated.  ?GLP-1 analogues - In a retrospective open-label study, a long-acting GLP-1 analogue was associated with a reduction in parenteral nutrition requirements in five adult SBS patients, presumably through slowing gut transit given its lack of intestinotrophic properties [26]. A subsequent placebo-controlled study evaluating the acute effects of continuous infusions of GLP-1, GLP-2, and a combination of both GLP-1 and GLP-2 in adults with SBS found that all treatments significantly reduced the fecal wet weight, energy, nitrogen, sodium, and potassium losses compared with placebo [27]. The effects of GLP-1 were less potent than GLP-2, while the combination of the two showed additive effects. In an open-label pilot study, liraglutide was subcutaneously administered daily to eight SBS patients with an end-jejunostomy. Liraglutide reduced ostomy wet weight output and increased both intestinal wet weight and energy absorption. Transiently reduced appetite and nausea were described. Larger and longer duration studies are needed before this treatment can be recommended [28].

## 2022-09-19 NOTE — Progress Notes (Signed)
Gynecologic Oncology Interval Visit   Referring Provider: Primary Gynecologic Oncologist: Dr. Suzan Slick  Chief Concern: Second opinion for ovarian cancer  Subjective:  Diane Acosta is a 62 y.o. G2P2 female who is seen in consultation from Dr. Colonel Bald for second opinion for management of recurrent stage IC endometrioid ovarian cancer. She presents to clinic with her husband, Diane Acosta, of ~40 years. They are highschool sweethearts.   Since her last visit with [place] she had surgery on 08/05/2022 exploratory laparotomy, lysis of adhesions, eneterotomy repair, left ureterolysis, radial left pelvic sidewall dissection and resection of psoas mass, rectosigmoid resection and primary end-to-end anastomosis, ileocecal resection, side-to-side anastomosis, diverting loop ileostomy for oligometastatic recurrent ovarian cancer with Dr. Carlis Abbott at [facility].    Pathology:  Diagnosis A: Rectosigmoid colon, resection - Recurrent endometrioid adenocarcinoma with squamous differentiation (ovarian primary), involving the bowel wall and extending into and through the mucosa - Colonic diverticula, one with focal mucosal acute inflammation - Proximal and distal colonic margins are negative for carcinoma   B: Left psoas abdominal wall tumor, excision - Recurrent endometrioid adenocarcinoma with squamous differentiation (ovarian primary), involving fibroadipose tissue and approaching, but not involving, focal skeletal muscle   C: Rectum and anastomotic rings, excision - Fragments of colorectal tissue with no malignancy identified   D: Terminal ileum and cecum, resection - Ileocecal tissue with no malignancy identified - Few foci with mucosal ischemic changes - Proximal and distal margins appear viable - One lymph node negative for metastatic carcinoma (0/1)  [facility] Tumor Board recommendations: Recommendations were for letrozole maintenance therapy.  Since that she had clear negative margins with the surgery.   There is also plan for barium enema followed by ostomy reversal on April 11 or 12, 2024  DEXA 09/14/2022 WHO classification is NORMAL BONE MINERAL DENSITY.    Gynecologic Oncology History  She initially presented with abdominal bloating and the findings of a 23 x 13 x 24 cm solid and cystic pelvic mass. She underwent exploratory laparotomy February 02, 2010. She is found to have a endometrioid ovarian cancer. Complete surgical staging was performed ultimately staged as a stage IC grade 1 endometrioid ovarian cancer associated with endometriosis. She received 3 cycles of carboplatin and Taxol postoperatively completed April 08, 2010.  She was followed free of disease until CA-125 began to rise in September 2015. In October 2015 a CT scan showed recurrent disease predominantly in the pelvis and along the right psoas muscle. This is confirmed by fine needle aspirate.  The patient underwent secondary cytoreductive surgery on May 28, 2014. All gross tumor was resected. Final pathology showed an endometrioid adenocarcinoma with squamous differentiation 1 specimen was poorly differentiated.  She initiated second line chemotherapy using carboplatin and Avastin and Taxol. After 6 cycles her CA-125 was 2.2 CT scan was negative (October 28, 2014).She continued Avastin therapy. Avastin was completed in March 2017.  In January 2018 the patient's CA-125 rose slightly (13 as per mL) and on imaging noted to have her right pelvic sidewall as well as sigmoid colon. This was treated with whole pelvis radiation therapy to a dose of 50.4 Gy. Radiation was completed in June 2018.  CA-125 bumped to 11.8 in March 2021. PET scan was performed consistent with recurrent disease.  10/2019 PET  IMPRESSION: - Intensely avid soft tissue nodule in the left pelvis along the psoas muscle concerning for a hypermetabolic retroperitoneal lymph node and metastatic disease.    -New subcentimeter pulmonary nodules with the larger  left upper lobe pulmonary  nodule demonstrating mild uptake concerning for metastatic disease.   - Focal uptake in the sigmoid colon corresponding to ill-defined soft tissue thickening. Recommend further evaluation with colonoscopy.  11/2019 Started cisplatin/gemzar. Patient received 3 cycles of cisplatin/gemzar - follow-up PET scan showed:   02/01/2020 During treatment, patient was diagnosed with a right subclavian vein thrombosis and is currently on eliquis. She has required Port removal and then replacement and had stents placed in vessels.   02/26/2020 PET IMPRESSION: - Left pelvic hypermetabolic soft tissue nodule is unchanged, concerning for metastatic disease.   - Left upper lobe pulmonary nodule with mild FDG uptake is also unchanged, concerning for metastases disease.   - Unchanged focal uptake in the sigmoid colon corresponding to ill-defined soft tissue thickening. Recommend further evaluation with colonoscopy.   02/26/2020 Chest/A/P CT -Interval decreased conspicuity of the previously noted thrombus along the distal right subclavian vein, difficult to assess due to background motion and streak artifacts.   -Previously described hypodensity at the right subclavian vein is not well assessed due to timing of contrast injection.   -No new or enlarging sites of metastatic disease.  -Left pelvic soft tissue nodule/implant is slightly decreased in size. This lesion demonstrates radiotracer uptake on concurrent PET.  -Wall thickening of distal ileal bowel loops, unchanged from several prior exams and may represent sequelae of radiation  -No focal areas of wall thickening or inflammatory changes within the colon to correlate with foci of uptake on concurrent current PET.  -Refer to same-day CT chest for evaluation above the diaphragm.  05/09/2020 Chest CT  Resolution of right lower lobe pulmonary nodule and interval decrease in size of subpleural left upper lobe nodule.   05/26/2020  PET/CT  - Interval slight decrease in the size and FDG uptake of the hypermetabolic soft tissue nodule which remains persistently hypermetabolic. Still concerning for metastatic disease.   -Interval decrease in size and FDG uptake of the persistently hypermetabolic focal thickening in the sigmoid colon. This still remains suspicious for potential metastatic disease.   -Interval decrease in the size and FDG uptake of these pleural nodule in the left upper lobe and interval resolution of the non-FDG avid pleural nodule in the right lower lobe.   09/21/2020 PET/CT  Lungs and Pleura: Stable non-FDG avid left upper lobe subpleural micronodule, too small for PET. No pleural effusion. Bibasilar subsegmental atelectasis. Mild bilateral posterior pleural thickening.   Liver: A few small subcentimeter foci of increased FDG activity within the right hepatic lobe without corresponding CT abnormality (for reference: CT 153, 157 and 160).   Mildly decreased FDG uptake of the soft tissue nodule adjacent to the left psoas muscle measuring 1.5 cm, previously 1.9 cm (CT 222). Stable 2.5 cm non-FDG avid right lower quadrant fluid-filled collection with peripheral calcification (CT 237), likely a small seroma or lymphocele.   Interval mild decrease in size and FDG uptake of the soft tissue nodule adjacent to left psoas muscle.   -Continued interval decrease in FDG uptake involving the focally thickened sigmoid colon. This is indeterminate but less suspicious for metastatic disease/malignancy given interval improvement.   -Multiple subcentimeter foci of indeterminate FDG activity within the right hepatic lobe without corresponding CT abnormality. Indeterminate. Attention on short-term follow-up PET/CT (12 weeks).  She has received 9 cycles of cisplatin/gemzar with response to treatment - decrease in activity and size of sidewall and colon nodule, resolution of disease in lung, new concern for disease in liver but  there is not definitive evidence of disease. Dr.  Bae-Jump counseled her about options for treatment included continued gemzar/cisplatin, cisplatin only versus break from treatment. She has required multiple transfusions (6x) since 06/11/2020 for anemia. Other side effects include neuropathy (she had dropped glasses and has imbalance); fatigue requiring a steroid taper, weakness, and labile BPs (which I suspect is due to anemia).   She opted to come off combination gem/cisplatin therapy and continue with single agent cisplatin alone with plan to repeat PET imaging after 3 cycles of treatment.   11/28/19-10/15/20 gem/cisplatin x 10 cycles with stable disease; treatment holiday  10/2021: concern for mild progression at pelvic/psoas and sigmoid colon; patient elected to have interval followup off treatment until after birth of granddaughter. 02/2022: PET with progression of dx at left psoas and sigmoid colon  05/16/2022 colonoscopy  The perianal and digital rectal examinations were normal.     A malignant-appearing, intrinsic moderate stenosis measuring 5 cm (in length) x 1 cm (inner diameter) was found in the recto-sigmoid colon  (10-15 cm from anus) and was traversed. Biopsies were taken with a cold forceps for histology.     Multiple small-mouthed diverticula were found in the sigmoid colon and descending colon. The exam was otherwise without abnormality.   A: Colon, sigmoid, biopsy - Adenocarcinoma, consistent with know history of endometrioid carcinoma. Immunohistochemical stains are positive for CK7, ER, and PR; PAX8 shows focal positive staining; stains for CK20, CDX2, and WT1 are negative; P53 shows wild-type staining. The immunohistochemical staining profile supports the above diagnosis.   PET scan obtained.   Dr. Colonel Bald called the patient on 05/24/22 with her PET scan results:  IMPRESSION: -Interval increase in prominence of soft tissue nodule adjacent to the left psoas muscle with increased  hypermetabolic activity over the past 20 months, concerning for disease progression.  -Focal hypermetabolic activity in a thickened segment of the sigmoid colon has waxed and waned but remains strongly avid, recommend further evaluation with colonoscopy or sigmoidoscopy.  -Uptake in the right breast is not significant at the present time. Mammography could be performed when clinically indicated.  They discussed surgery as the next possible step in care (with Dr. Carlis Abbott) versus platinum/taxane chemotherapy. The risks and benefits of each approach was reviewed, including risk of ostomy for surgery and risk of malignant obstruction if no surgery.   She opted for surgery.      Tumor testing: Tumor mutation profile was MSS, +CTNNB1 and ARID1A mutation based on [facility] in 2015.   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   Abnormal positron emission tomography (PET) of colon 09/24/2016   Carotid stenosis 05/05/2016   Hypercholesterolemia 04/28/2016   Atherosclerosis of right carotid artery 03/28/2016   Peripheral neuropathy due to chemotherapy ([place]) 07/14/2015   Malignant neoplasm of ovary ([place]) 01/07/2015   Essential hypertension 08/21/2014   Ovarian carcinoma, epithelial ([place]) 01/09/2013    Past Medical History: Past Medical History:  Diagnosis Date   Asthma    Blood clot in vein     Past Surgical History: Past Surgical History:  Procedure Laterality Date   APPENDECTOMY     TOTAL ABDOMINAL HYSTERECTOMY W/ BILATERAL SALPINGOOPHORECTOMY      Past Gynecologic History:  As per  HPI  OB History:  OB History   No obstetric history on file.    Family History: Family History  Problem Relation Age of Onset   Thyroid disease Mother    Mental illness Mother    Diverticulitis Mother    Diverticulitis Father    Heart disease Father    Hypertension Father    Stroke Paternal Grandmother    Stroke Paternal Grandfather     Social History: Social History   Socioeconomic History   Marital status: Married    Spouse name: Not on file   Number of children: 2   Years of education: Not on file   Highest education level: Not on file  Occupational History   Not on file  Tobacco Use   Smoking status: Never   Smokeless tobacco: Never  Vaping Use   Vaping Use: Never used  Substance and Sexual Activity   Alcohol use: Yes    Alcohol/week: 0.0 standard drinks of alcohol    Comment: rare   Drug use: No   Sexual activity: Not on file  Other Topics Concern   Not on file  Social History Narrative   Not on file   Social Determinants of Health   Financial Resource Strain: Not on file  Food Insecurity: Not on file  Transportation Needs: Not on file  Physical Activity: Not on file  Stress: Not on file  Social Connections: Not on file  Intimate Partner Violence: Not on file    Allergies: Allergies  Allergen Reactions   Alpha-Gal Diarrhea, Hives, Anaphylaxis, Itching, Rash and Swelling    Other reaction(s): Other (See Comments) Wheezing    Hydralazine Hcl Anaphylaxis   Levofloxacin     Other reaction(s): Other (See Comments) Hypertensive emergency. Other reaction(s): Other (See Comments) Reaction: Hypertensive emergency.    Current Medications: Current Outpatient Medications  Medication Sig Dispense Refill   apixaban (ELIQUIS) 5 MG TABS tablet TAKE 1 TABLET(5 MG) BY MOUTH TWICE DAILY 180 tablet 3   cromolyn (GASTROCROM) 100 MG/5ML solution Take 200 mg by mouth 4 (four) times daily as needed.     famotidine (PEPCID) 20 MG tablet Take 20 mg by mouth 2 (two) times daily.     ibuprofen  (ADVIL) 600 MG tablet Take by mouth.     IMODIUM A-D 2 MG tablet TAKE 1 TABLET BY MOUTH EVERY 8 HOURS AS NEEDED FOR DIARRHEA     LORazepam (ATIVAN) 0.5 MG tablet Take 0.5 mg by mouth 3 (three) times daily as needed.     metoprolol succinate (TOPROL-XL) 25 MG 24 hr tablet TAKE 1/2 TO 1 TABLET(12.5 TO 25 MG) BY MOUTH TWICE DAILY AS NEEDED 180 tablet 1   polyethylene glycol powder (GLYCOLAX/MIRALAX) 17 GM/SCOOP powder Take by mouth daily.     EPINEPHrine 0.3 mg/0.3 mL IJ SOAJ injection Inject 0.3 mg into the muscle as needed for anaphylaxis. (Patient not taking: Reported on 09/19/2022) 2 each 12   Psyllium (METAMUCIL) 0.36 g CAPS Take by mouth. (Patient not taking: Reported on 09/19/2022)     No current facility-administered medications for this visit.    Review of Systems General: fatigue and weakness   HEENT: no complaints  Lungs: no complaints  Cardiac: no complaints  GI: diarrhea due to ostomy.  She changes her bag 11 times a day.  She is no longer checking her output she is fine given that she is clinically doing well.  She does not having a specific dietary recommendations.    GU: no complaints  Musculoskeletal: no complaints  Extremities: no complaints  Skin: no complaints  Neuro: no complaints  Endocrine: no complaints  Psych: no complaints      Objective:  Physical Examination:  BP 125/70 (BP Location: Left Arm, Patient Position: Sitting)   Pulse 83   Resp 18   Ht 5' 6.5" (1.689 m)   Wt 159 lb (72.1 kg)   SpO2 99%   BMI 25.28 kg/m    ECOG Performance Status: 0 - Asymptomatic  GENERAL: Patient is a well appearing female in no acute distress HEENT:  PERRL, neck supple with midline trachea.  NODES:  No cervical, supraclavicular lymphadenopathy palpated.  LUNGS:  Normal respiratory effort ABDOMEN:  Soft, nontender.  Negative for ascites masses or hernias.  Ostomy bag in place.  The ostomy looks nice and pink SKIN: Incision is closed and intact without evidence of  infection NEURO:  Nonfocal. Well oriented.  Appropriate affect.  Pelvic: deferred   Labs  Ref Range & Units 3 mo ago   CA 125 0 - 35 U/mL 13  Resulting Agency  [facility]   Specimen Collected: 06/17/22 13:05    Radiologic Imaging: As per HPI    Assessment:  Shanice Ko is a 62 y.o. female diagnosed with platinum-sensitive recurrent stage IC endometrioid ovarian cancer s/p cisplatin/gemcitabine switched to single agent cisplatin due to toxicity s/p secondary debulking for progressive disease.  Some postoperative recovery.  Diverting ostomy with asymptomatic high volume output.    Medical co-morbidities complicating care: prior abdominal surgery, VTE.  Plan:   Problem List Items Addressed This Visit       Endocrine   Malignant neoplasm of ovary ([place]) - Primary   Other Visit Diagnoses     Counseling and coordination of care            We discussed options for management writing adjuvants postoperative therapy.  I think maintenance letrozole therapy makes a lot of sense given her desire to enhance quality life and her prior pathology report demonstrating grade 1 endometrioid malignancy in 2011.  Her current pathology report does not adjust grade and I have out to Dr. Waldron Session jump.  I did do think that may be helpful to have information regarding tumor grade from her most recent surgery.  She will continue to follow-up with Dr. Waldron Session Jump and Dr. Carlis Abbott.  With regards to her ostomy she has done an excellent job maintaining hydration.  It does seem like her output may be around 2000 cc but unable to determine.  Clinically she is doing well and she has not lost any weight.  Given that she was previously checking her amounts and that seemed to be somewhat consumptive I do not recommend that she continue to restart as she is clinically doing well. We did talk about dietary modifications and I recommended adding a brat diet.  I also gave her additional  information that she can share with Dr. Waldron Session Jump if needed  She will return to see [place] as needed.  She is a lovely patient and we are really excited that she has done well after surgery.  The patient's diagnosis, an outline of the further diagnostic and laboratory studies which will be required, the recommendation, and alternatives were discussed.  High complexity decision making. All questions were answered to the patient's satisfaction.  A total of 45 minutes were spent with the patient/family today; >50% was spent in education, counseling and coordination of care for ovarian cancer and contacting Dr.  Bae-Jump.   Eurika Sandy Gaetana Michaelis, MD

## 2022-10-18 ENCOUNTER — Encounter: Payer: Self-pay | Admitting: Family Medicine

## 2022-10-18 ENCOUNTER — Ambulatory Visit (INDEPENDENT_AMBULATORY_CARE_PROVIDER_SITE_OTHER): Payer: Commercial Managed Care - PPO | Admitting: Family Medicine

## 2022-10-18 VITALS — BP 124/74 | HR 86 | Temp 98.9°F | Ht 66.5 in | Wt 156.2 lb

## 2022-10-18 DIAGNOSIS — G620 Drug-induced polyneuropathy: Secondary | ICD-10-CM | POA: Diagnosis not present

## 2022-10-18 DIAGNOSIS — E7521 Fabry (-Anderson) disease: Secondary | ICD-10-CM

## 2022-10-18 DIAGNOSIS — Z1231 Encounter for screening mammogram for malignant neoplasm of breast: Secondary | ICD-10-CM

## 2022-10-18 DIAGNOSIS — I82B11 Acute embolism and thrombosis of right subclavian vein: Secondary | ICD-10-CM

## 2022-10-18 DIAGNOSIS — Z9889 Other specified postprocedural states: Secondary | ICD-10-CM | POA: Insufficient documentation

## 2022-10-18 DIAGNOSIS — I10 Essential (primary) hypertension: Secondary | ICD-10-CM

## 2022-10-18 DIAGNOSIS — E7800 Pure hypercholesterolemia, unspecified: Secondary | ICD-10-CM | POA: Diagnosis not present

## 2022-10-18 DIAGNOSIS — T451X5A Adverse effect of antineoplastic and immunosuppressive drugs, initial encounter: Secondary | ICD-10-CM

## 2022-10-18 MED ORDER — APIXABAN 5 MG PO TABS: ORAL_TABLET | ORAL | 3 refills | Status: DC

## 2022-10-18 MED ORDER — METOPROLOL SUCCINATE ER 25 MG PO TB24: ORAL_TABLET | ORAL | 1 refills | Status: DC

## 2022-10-18 NOTE — Assessment & Plan Note (Signed)
Checked recently at oncology and normal.

## 2022-10-18 NOTE — Assessment & Plan Note (Signed)
Stable. Continue eliquis. CBC drawn at end on March at oncology normal.

## 2022-10-18 NOTE — Assessment & Plan Note (Signed)
Under good control on current regimen. Continue current regimen. Continue to monitor. Call with any concerns. Refills given. Labs drawn at oncology at end of March reviewed and normal.

## 2022-10-18 NOTE — Assessment & Plan Note (Signed)
Continue to follow with oncology. Call with any concerns.  

## 2022-10-18 NOTE — Assessment & Plan Note (Signed)
To have ostomy reversal surgery on Friday. Looking forward to it.

## 2022-10-18 NOTE — Assessment & Plan Note (Signed)
Will get drawn next blood draw at oncology. Call with any concerns.

## 2022-10-18 NOTE — Assessment & Plan Note (Signed)
Following with allergy. Continue to monitor. Call with any concerns.

## 2022-10-18 NOTE — Progress Notes (Signed)
BP 124/74   Pulse 86   Temp 98.9 F (37.2 C) (Oral)   Ht 5' 6.5" (1.689 m)   Wt 156 lb 3.2 oz (70.9 kg)   SpO2 98%   BMI 24.83 kg/m    Subjective:    Patient ID: Diane Acosta, female    DOB: [birth year], 62 y.o.   MRN: [id number]  HPI: Diane Acosta is a 62 y.o. female  Chief Complaint  Patient presents with   Hypertension   To have surgery on Friday for ostomy reversal. Did not have a great experience last time she has surgery with diet being advanced too quickly. She continues to work with her Careers adviser and her oncologist and her allergist. Things have been stable.   HYPERTENSION / HYPERLIPIDEMIA Satisfied with current treatment? yes Duration of hypertension: chronic BP monitoring frequency: not checking BP medication side effects: no Past BP meds: metoprolol Duration of hyperlipidemia: chronic Cholesterol medication side effects: no Cholesterol supplements: none Past cholesterol medications: none Medication compliance: excellent compliance Aspirin: no Recent stressors: no Recurrent headaches: no Visual changes: no Palpitations: no Dyspnea: no Chest pain: no Lower extremity edema: no Dizzy/lightheaded: no  Relevant past medical, surgical, family and social history reviewed and updated as indicated. Interim medical history since our last visit reviewed. Allergies and medications reviewed and updated.  Review of Systems  Constitutional: Negative.   Respiratory: Negative.    Cardiovascular: Negative.   Gastrointestinal: Negative.   Musculoskeletal: Negative.   Neurological: Negative.   Psychiatric/Behavioral: Negative.      Per HPI unless specifically indicated above     Objective:    BP 124/74   Pulse 86   Temp 98.9 F (37.2 C) (Oral)   Ht 5' 6.5" (1.689 m)   Wt 156 lb 3.2 oz (70.9 kg)   SpO2 98%   BMI 24.83 kg/m   Wt Readings from Last 3 Encounters:  10/18/22 156 lb 3.2 oz (70.9 kg)  09/19/22 159 lb (72.1 kg)  06/01/22 167 lb 8 oz  (76 kg)    Physical Exam Vitals and nursing note reviewed.  Constitutional:      General: She is not in acute distress.    Appearance: Normal appearance. She is not ill-appearing, toxic-appearing or diaphoretic.  HENT:     Head: Normocephalic and atraumatic.     Right Ear: External ear normal.     Left Ear: External ear normal.     Nose: Nose normal.     Mouth/Throat:     Mouth: Mucous membranes are moist.     Pharynx: Oropharynx is clear.  Eyes:     General: No scleral icterus.       Right eye: No discharge.        Left eye: No discharge.     Extraocular Movements: Extraocular movements intact.     Conjunctiva/sclera: Conjunctivae normal.     Pupils: Pupils are equal, round, and reactive to light.  Cardiovascular:     Rate and Rhythm: Normal rate and regular rhythm.     Pulses: Normal pulses.     Heart sounds: Normal heart sounds. No murmur heard.    No friction rub. No gallop.  Pulmonary:     Effort: Pulmonary effort is normal. No respiratory distress.     Breath sounds: Normal breath sounds. No stridor. No wheezing, rhonchi or rales.  Chest:     Chest wall: No tenderness.  Musculoskeletal:        General: Normal range of motion.  Cervical back: Normal range of motion and neck supple.  Skin:    General: Skin is warm and dry.     Capillary Refill: Capillary refill takes less than 2 seconds.     Coloration: Skin is not jaundiced or pale.     Findings: No bruising, erythema, lesion or rash.  Neurological:     General: No focal deficit present.     Mental Status: She is alert and oriented to person, place, and time. Mental status is at baseline.  Psychiatric:        Mood and Affect: Mood normal.        Behavior: Behavior normal.        Thought Content: Thought content normal.        Judgment: Judgment normal.     Results for orders placed or performed in visit on 04/01/21  Microscopic Examination   Urine  Result Value Ref Range   WBC, UA 0-5 0 - 5 /hpf   RBC,  Urine None seen 0 - 2 /hpf   Epithelial Cells (non renal) 0-10 0 - 10 /hpf   Mucus, UA Present (A) Not Estab.   Bacteria, UA None seen None seen/Few  HM MAMMOGRAPHY  Result Value Ref Range   HM Mammogram 0-4 Bi-Rad 0-4 Bi-Rad, Self Reported Normal  Urinalysis, Routine w reflex microscopic  Result Value Ref Range   Specific Gravity, UA 1.020 1.005 - 1.030   pH, UA 6.0 5.0 - 7.5   Color, UA Yellow Yellow   Appearance Ur Clear Clear   Leukocytes,UA 1+ (A) Negative   Protein,UA Negative Negative/Trace   Glucose, UA Negative Negative   Ketones, UA Negative Negative   RBC, UA Negative Negative   Bilirubin, UA Negative Negative   Urobilinogen, Ur 0.2 0.2 - 1.0 mg/dL   Nitrite, UA Negative Negative   Microscopic Examination See below:   Microalbumin, Urine Waived  Result Value Ref Range   Microalb, Ur Waived 30 (H) 0 - 19 mg/L   Creatinine, Urine Waived 200 10 - 300 mg/dL   Microalb/Creat Ratio <30 <30 mg/g      Assessment & Plan:   Problem List Items Addressed This Visit       Cardiovascular and Mediastinum   Essential hypertension - Primary    Under good control on current regimen. Continue current regimen. Continue to monitor. Call with any concerns. Refills given. Labs drawn at oncology at end of March reviewed and normal.        Relevant Medications   apixaban (ELIQUIS) 5 MG TABS tablet   metoprolol succinate (TOPROL-XL) 25 MG 24 hr tablet   Right subclavian vein thrombosis    Stable. Continue eliquis. CBC drawn at end on March at oncology normal.       Relevant Medications   apixaban (ELIQUIS) 5 MG TABS tablet   metoprolol succinate (TOPROL-XL) 25 MG 24 hr tablet   Alpha-galactosidase A deficiency    Following with allergy. Continue to monitor. Call with any concerns.       Relevant Medications   apixaban (ELIQUIS) 5 MG TABS tablet   metoprolol succinate (TOPROL-XL) 25 MG 24 hr tablet     Nervous and Auditory   Peripheral neuropathy due to chemotherapy     Continue to follow with oncology. Call with any concerns.         Other   Hypercholesterolemia    Will get drawn next blood draw at oncology. Call with any concerns.       Relevant  Medications   apixaban (ELIQUIS) 5 MG TABS tablet   metoprolol succinate (TOPROL-XL) 25 MG 24 hr tablet   Other Relevant Orders   Lipid Panel w/o Chol/HDL Ratio   Hypomagnesemia    Checked recently at oncology and normal.       History of ileostomy    To have ostomy reversal surgery on Friday. Looking forward to it.       Other Visit Diagnoses     Encounter for screening mammogram for malignant neoplasm of breast       Mammogram ordered. Will go when she can.   Relevant Orders   MM 3D SCREENING MAMMOGRAM BILATERAL BREAST        Follow up plan: Return in about 6 months (around 04/19/2023) for physical.

## 2022-10-20 ENCOUNTER — Telehealth: Payer: Self-pay

## 2022-10-20 ENCOUNTER — Other Ambulatory Visit: Payer: Self-pay | Admitting: Family Medicine

## 2022-10-20 NOTE — Telephone Encounter (Signed)
Mammogram Order was sent to [facility].

## 2022-10-20 NOTE — Telephone Encounter (Signed)
Requested Prescriptions  Refused Prescriptions Disp Refills   metoprolol succinate (TOPROL-XL) 25 MG 24 hr tablet [Pharmacy Med Name: METOPROLOL ER SUCCINATE 25MG  TABS] 180 tablet 1    Sig: TAKE 1/2 TO 1 TABLET(12.5 TO 25 MG) BY MOUTH TWICE DAILY AS NEEDED     Cardiovascular:  Beta Blockers Passed - 10/20/2022  3:14 AM      Passed - Last BP in normal range    BP Readings from Last 1 Encounters:  10/18/22 124/74         Passed - Last Heart Rate in normal range    Pulse Readings from Last 1 Encounters:  10/18/22 86         Passed - Valid encounter within last 6 months    Recent Outpatient Visits           2 days ago Essential hypertension   [place] [facility] [place], Megan P, DO   6 months ago Routine general medical examination at a health care facility   [facility] [place], Megan P, DO   6 months ago Encounter for screening mammogram for malignant neoplasm of breast   [place] [place], Megan P, DO   1 year ago Hypercholesterolemia   [place] [facility] [place], Megan P, DO   1 year ago Routine general medical examination at a health care facility   [facility] Dorcas Carrow, DO       Future Appointments             In 4 months Althea Charon, Netta Neat, DO [place] [facility], [facility]   In 6 months Laural Benes, Oralia Rud, DO [place] [facility], [facility]             ELIQUIS 5 MG TABS tablet [Pharmacy Med Name: ELIQUIS 5MG  TABLETS] 180 tablet 3    Sig: TAKE 1 TABLET(5 MG) BY MOUTH TWICE DAILY     Hematology:  Anticoagulants - apixaban Failed - 10/20/2022  3:14 AM      Failed - PLT in normal range and within 360 days    Platelets  Date Value Ref Range Status  08/25/2020 472 (H) 150 - 450 x10E3/uL Final         Failed - HGB in normal range and within 360 days    Hemoglobin  Date Value Ref Range Status  08/25/2020 9.7 (L) 11.1  - 15.9 g/dL Final         Failed - HCT in normal range and within 360 days    Hematocrit  Date Value Ref Range Status  08/25/2020 32.1 (L) 34.0 - 46.6 % Final         Failed - Cr in normal range and within 360 days    Creatinine, Ser  Date Value Ref Range Status  08/13/2020 0.49 (L) 0.57 - 1.00 mg/dL Final         Failed - AST in normal range and within 360 days    AST  Date Value Ref Range Status  08/13/2020 28 0 - 40 IU/L Final   AST (SGOT) Piccolo, Waived  Date Value Ref Range Status  05/07/2018 33 11 - 38 U/L Final         Failed - ALT in normal range and within 360 days    ALT  Date Value Ref Range Status  08/13/2020 27 0 - 32 IU/L Final   ALT (SGPT) Piccolo, [place]  Date Value Ref Range Status  05/07/2018 26 10 - 47 U/L Final         Passed - Valid encounter within last 12 months    Recent Outpatient Visits           2 days ago Essential hypertension   [place] [facility] [place], Megan P, DO   6 months ago Routine general medical examination at a health care facility   [facility] [place] P, DO   6 months ago Encounter for screening mammogram for malignant neoplasm of breast   [facility] [place], [place] P, DO   1 year ago Hypercholesterolemia   [facility] [place], Megan P, DO   1 year ago Routine general medical examination at a health care facility   [facility] Dorcas Carrow, DO       Future Appointments             In 4 months Althea Charon, Netta Neat, DO [place] [facility], [facility]   In 6 months Laural Benes, Oralia Rud, DO [place] [facility], [facility]

## 2022-10-20 NOTE — Telephone Encounter (Signed)
-----   Message from Dorcas Carrow, DO sent at 10/18/2022 10:22 AM EDT ----- Mammo order to [facility] please

## 2022-10-27 ENCOUNTER — Telehealth: Payer: Self-pay

## 2022-10-27 NOTE — Transitions of Care (Post Inpatient/ED Visit) (Signed)
   10/27/2022  Name: Diane Acosta MRN: [id number] DOB: [birth year]  Today's TOC FU Call Status: Today's TOC FU Call Status:: Successful TOC FU Call Competed TOC FU Call Complete Date: 10/27/22  Transition Care Management Follow-up Telephone Call Date of Discharge: 10/26/22 Discharge Facility: Other ([facility] Facility) Name of Other ([facility]) Discharge Facility: [facility] Type of Discharge: Inpatient Admission Primary Inpatient Discharge Diagnosis:: ileostony reversal How have you been since you were released from the hospital?: Better Any questions or concerns?: No  Items Reviewed: Did you receive and understand the discharge instructions provided?: Yes Medications obtained and verified?: Yes (Medications Reviewed) Any new allergies since your discharge?: No Dietary orders reviewed?: Yes Do you have support at home?: Yes People in Home: spouse  Home Care and Equipment/Supplies: Were Home Health Services Ordered?: NA Any new equipment or medical supplies ordered?: NA  Functional Questionnaire: Do you need assistance with bathing/showering or dressing?: Yes Do you need assistance with meal preparation?: Yes Do you need assistance with eating?: No Do you have difficulty maintaining continence: Yes Do you need assistance with getting out of bed/getting out of a chair/moving?: No Do you have difficulty managing or taking your medications?: No  Follow up appointments reviewed: PCP Follow-up appointment confirmed?: NA Specialist Hospital Follow-up appointment confirmed?: Yes Date of Specialist follow-up appointment?: 11/09/22 Follow-Up Specialty Provider:: surgeon Do you need transportation to your follow-up appointment?: No Do you understand care options if your condition(s) worsen?: Yes-patient verbalized understanding    SIGNATURE Karena Addison, LPN [facility] Nurse Health Advisor Direct Dial [phone]

## 2023-02-07 ENCOUNTER — Encounter: Payer: Self-pay | Admitting: Family Medicine

## 2023-02-07 LAB — HM MAMMOGRAPHY

## 2023-02-28 ENCOUNTER — Ambulatory Visit: Payer: Commercial Managed Care - PPO | Admitting: Family Medicine

## 2023-02-28 ENCOUNTER — Encounter: Payer: Self-pay | Admitting: Family Medicine

## 2023-02-28 VITALS — BP 134/72 | HR 83 | Ht 66.5 in | Wt 162.0 lb

## 2023-02-28 DIAGNOSIS — D500 Iron deficiency anemia secondary to blood loss (chronic): Secondary | ICD-10-CM

## 2023-02-28 DIAGNOSIS — I10 Essential (primary) hypertension: Secondary | ICD-10-CM

## 2023-02-28 DIAGNOSIS — C569 Malignant neoplasm of unspecified ovary: Secondary | ICD-10-CM | POA: Diagnosis not present

## 2023-02-28 DIAGNOSIS — G620 Drug-induced polyneuropathy: Secondary | ICD-10-CM

## 2023-02-28 NOTE — Progress Notes (Signed)
Subjective:    Patient ID: Diane Acosta, female    DOB: 1960/12/28, 62 y.o.   MRN: [id number]  Diane Acosta is a 62 y.o. female presenting on 02/28/2023 for New Patient (Initial Visit) and Hernia (Hard lump in lower right abdomen. Not painful. Started 2 months ago. )  Here to establish care with new PCP Previously with [facility] - Dr Laural Benes Her husband Casimiro Needle will be my new patient tomorrow.  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 Followed [facility] Surgery - Dr Ginnie Smart She describes high metabolism or reactions and she has done well, and she knows her body best Recently completed ileostomy, now following with Lorella Nimrod PA. She had issue with colostomy, has had surgery for colostomy reversal 10/2022 She has difficulty with vitamin deficiency /   Followed by [facility]ematology, Dr Bennett Scrape Has improved w/ infusions and treatment  Alpha Gal Followed by Dr Simmie [facility] Allergy  Elevated BP white coat syndrome elevated BP in doctors office and hospitals  Still has port in place, she is very hard blood stick and does not do blood work often.  Has Ativan AS NEEDED for procedural ONLY      10/18/2022   10:04 AM 04/18/2022    1:19 PM 04/01/2022    8:53 AM  Depression screen PHQ 2/9  Decreased Interest 0 0 0  Down, Depressed, Hopeless 0 0 0  PHQ - 2 Score 0 0 0  Altered sleeping 0 0 0  Tired, decreased energy 0 0 0  Change in appetite 0 0 0  Feeling bad or failure about yourself  0 0 0  Trouble concentrating 0 0 0  Moving slowly or fidgety/restless 0 0 0  Suicidal thoughts 0 0 0  PHQ-9 Score 0 0 0  Difficult doing work/chores Not difficult at all Not difficult at all Not difficult at all    Past Medical History:  Diagnosis Date   Asthma     Atherosclerosis of right carotid artery    Blood clot in vein    Hyperlipidemia    Hypertension    Hypomagnesemia    Iron deficiency anemia    Ovarian cancer (HCC)    Past Surgical History:  Procedure Laterality Date   APPENDECTOMY     ILEOSTOMY     TOTAL ABDOMINAL HYSTERECTOMY W/ BILATERAL SALPINGOOPHORECTOMY     Social History   Socioeconomic History   Marital status: Married    Spouse name: Not on file   Number of children: 2   Years of education: Not on file   Highest education level: Not on file  Occupational History   Not on file  Tobacco Use   Smoking status: Never   Smokeless tobacco: Never  Vaping Use   Vaping status: Never Used  Substance and Sexual Activity   Alcohol use: Yes    Alcohol/week: 0.0 standard drinks of alcohol    Comment: rare   Drug use: No   Sexual activity: Not on file  Other Topics Concern   Not on file  Social History Narrative   Not on file   Social Determinants of Health   Financial Resource Strain: Not on file  Food Insecurity: No Food Insecurity (02/02/2020)   Received from [facility]  Hunger Vital Sign    Worried About Programme researcher, broadcasting/film/video in the Last Year: Never true    Ran Out of Food in the Last Year: Never true  Transportation Needs: Not on file  Physical Activity: Not on file  Stress: Not on file  Social Connections: Not on file  Intimate Partner Violence: Not on file   Family History  Problem Relation Age of Onset   Thyroid disease Mother    Mental illness Mother    Diverticulitis Mother    Diverticulitis Father    Heart disease Father    Hypertension Father    Stroke Paternal Grandmother    Stroke Paternal Grandfather    Current Outpatient Medications on File Prior to Visit  Medication Sig   apixaban (ELIQUIS) 5 MG TABS tablet TAKE 1 TABLET(5 MG) BY MOUTH TWICE DAILY   Cholecalciferol (VITAMIN D3) 50 MCG (2000 UT) capsule Take 2,000 Units by mouth daily.   colesevelam (WELCHOL) 625 MG  tablet Take 625 mg by mouth 2 (two) times daily with a meal.   cromolyn (GASTROCROM) 100 MG/5ML solution Take 200 mg by mouth 4 (four) times daily as needed.   cyanocobalamin (VITAMIN B12) 500 MCG tablet Take 500 mcg by mouth daily.   famotidine (PEPCID) 20 MG tablet Take 20 mg by mouth 2 (two) times daily as needed.   ibuprofen (ADVIL) 600 MG tablet Take by mouth.   LORazepam (ATIVAN) 0.5 MG tablet Take 0.5 mg by mouth 3 (three) times daily as needed.   metoprolol succinate (TOPROL-XL) 25 MG 24 hr tablet TAKE 1/2 TO 1 TABLET(12.5 TO 25 MG) BY MOUTH TWICE DAILY AS NEEDED   Multiple Vitamins-Minerals (MULTIVITAMIN WITH MINERALS) tablet Take 1 tablet by mouth daily.   No current facility-administered medications on file prior to visit.    Review of Systems Per HPI unless specifically indicated above      Objective:    BP 134/72 (BP Location: Left Arm, Cuff Size: Normal)   Pulse 83   Ht 5' 6.5" (1.689 m)   Wt 162 lb (73.5 kg)   SpO2 97%   BMI 25.76 kg/m   Wt Readings from Last 3 Encounters:  02/28/23 162 lb (73.5 kg)  10/18/22 156 lb 3.2 oz (70.9 kg)  09/19/22 159 lb (72.1 kg)    Physical Exam Vitals and nursing note reviewed.  Constitutional:      General: She is not in acute distress.    Appearance: Normal appearance. She is well-developed. She is not diaphoretic.     Comments: Well-appearing, comfortable, cooperative  HENT:     Head: Normocephalic and atraumatic.  Eyes:     General:        Right eye: No discharge.        Left eye: No discharge.     Conjunctiva/sclera: Conjunctivae normal.  Cardiovascular:     Rate and Rhythm: Normal rate.  Pulmonary:     Effort: Pulmonary effort is normal.  Skin:    General: Skin is warm and dry.     Findings: No erythema or rash.  Neurological:     Mental Status: She is alert and oriented to person, place, and time.  Psychiatric:        Mood and Affect: Mood normal.        Behavior: Behavior normal.        Thought Content:  Thought content normal.     Comments: Well groomed, good eye contact, normal speech and thoughts     Results  for orders placed or performed in visit on 02/07/23  HM MAMMOGRAPHY  Result Value Ref Range   HM Mammogram 0-4 Bi-Rad 0-4 Bi-Rad, Self Reported Normal      Assessment & Plan:   Problem List Items Addressed This Visit     Essential hypertension - Primary   Relevant Medications   colesevelam (WELCHOL) 625 MG tablet   Iron deficiency anemia   Relevant Medications   cyanocobalamin (VITAMIN B12) 500 MCG tablet   Malignant neoplasm of ovary (HCC)   Ovarian carcinoma, epithelial (HCC)   Peripheral neuropathy due to chemotherapy [facility])    Establish care with new PCP Review outside records  Followed by [facility] for most of her specialists Including Oncology / GYN / Allergy/Immunology / GI - Surgery / Hematology   Reviewed her history regarding ovarian cancer and recurrences and most recent treatment plan.  Anemia, has received IV iron infusion therapy.  B12 deficiency history.  Earlier this year s/p ileostomy reversal  No orders of the defined types were placed in this encounter.   Follow up plan: Return in about 3 months (around 05/31/2023) for 3 month Annual Physical AM apt, any day of week. (labs to be drawn at [facility]).  Saralyn Pilar, DO [facility] 02/28/2023, 10:29 AM

## 2023-02-28 NOTE — Patient Instructions (Addendum)
Thank you for coming to the office today.    Please schedule a Follow-up Appointment to: Return in about 3 months (around 05/31/2023) for 3 month Annual Physical AM apt, any day of week. (labs to be drawn at [facility]).  If you have any other questions or concerns, please feel free to call the office or send a message through MyChart. You may also schedule an earlier appointment if necessary.  Additionally, you may be receiving a survey about your experience at our office within a few days to 1 week by e-mail or mail. We value your feedback.  Saralyn Pilar, DO [facility], [place]

## 2023-03-03 ENCOUNTER — Other Ambulatory Visit: Payer: Self-pay

## 2023-03-03 DIAGNOSIS — I10 Essential (primary) hypertension: Secondary | ICD-10-CM

## 2023-03-03 DIAGNOSIS — R7309 Other abnormal glucose: Secondary | ICD-10-CM

## 2023-03-03 DIAGNOSIS — D500 Iron deficiency anemia secondary to blood loss (chronic): Secondary | ICD-10-CM

## 2023-04-17 ENCOUNTER — Other Ambulatory Visit: Payer: Self-pay | Admitting: Family Medicine

## 2023-04-17 NOTE — Telephone Encounter (Signed)
Requested Prescriptions  Pending Prescriptions Disp Refills   metoprolol succinate (TOPROL-XL) 25 MG 24 hr tablet [Pharmacy Med Name: METOPROLOL ER SUCCINATE 25MG  TABS] 180 tablet 1    Sig: TAKE 1/2 TO 1 TABLET(12.5 TO 25 MG) BY MOUTH TWICE DAILY AS NEEDED     Cardiovascular:  Beta Blockers Passed - 04/17/2023  3:16 AM      Passed - Last BP in normal range    BP Readings from Last 1 Encounters:  02/28/23 134/72         Passed - Last Heart Rate in normal range    Pulse Readings from Last 1 Encounters:  02/28/23 83         Passed - Valid encounter within last 6 months    Recent Outpatient Visits           1 month ago Essential hypertension   [place] [facility] [place], Netta Neat, DO   6 months ago Essential hypertension   [place] [facility] [place], Megan P, DO   12 months ago Routine general medical examination at a health care facility   [facility] [place], [place] P, DO   1 year ago Encounter for screening mammogram for malignant neoplasm of breast   [place] [facility] [place], [place] P, DO   1 year ago Hypercholesterolemia   [place] [facility] Dorcas Carrow, DO       Future Appointments             In 1 month Althea Charon, Netta Neat, DO [place] [facility], [facility]

## 2023-04-20 ENCOUNTER — Encounter: Payer: Commercial Managed Care - PPO | Admitting: Family Medicine

## 2023-05-31 ENCOUNTER — Ambulatory Visit: Payer: Commercial Managed Care - PPO | Admitting: Family Medicine

## 2023-05-31 VITALS — BP 138/84 | Ht 65.5 in | Wt 164.0 lb

## 2023-05-31 DIAGNOSIS — G620 Drug-induced polyneuropathy: Secondary | ICD-10-CM | POA: Diagnosis not present

## 2023-05-31 DIAGNOSIS — I10 Essential (primary) hypertension: Secondary | ICD-10-CM

## 2023-05-31 DIAGNOSIS — T451X5A Adverse effect of antineoplastic and immunosuppressive drugs, initial encounter: Secondary | ICD-10-CM

## 2023-05-31 DIAGNOSIS — Z0000 Encounter for general adult medical examination without abnormal findings: Secondary | ICD-10-CM

## 2023-05-31 DIAGNOSIS — Z23 Encounter for immunization: Secondary | ICD-10-CM

## 2023-05-31 DIAGNOSIS — I6521 Occlusion and stenosis of right carotid artery: Secondary | ICD-10-CM

## 2023-05-31 DIAGNOSIS — D500 Iron deficiency anemia secondary to blood loss (chronic): Secondary | ICD-10-CM | POA: Diagnosis not present

## 2023-05-31 DIAGNOSIS — Z87892 Personal history of anaphylaxis: Secondary | ICD-10-CM

## 2023-05-31 MED ORDER — APIXABAN 5 MG PO TABS: 5.0000 mg | ORAL_TABLET | Freq: Two times a day (BID) | ORAL | 3 refills | Status: DC

## 2023-05-31 MED ORDER — EPINEPHRINE 0.3 MG/0.3ML IJ SOAJ: 0.3000 mg | INTRAMUSCULAR | 2 refills | Status: AC | PRN

## 2023-05-31 NOTE — Progress Notes (Signed)
Subjective:    Patient ID: Diane Acosta, female    DOB: 11-20-1960, 62 y.o.   MRN: [id number]  Diane Acosta is a 62 y.o. female presenting on 05/31/2023 for Annual Exam   HPI  Discussed the use of AI scribe software for clinical note transcription with the patient, who gave verbal consent to proceed.  She will get labs via port from her oncology team instead of at our office. Labs at [facility] They will order CBC CMET CA 125 B12  Chronic Anticoagulation History of DVT Cancer Port in place Varicose veins  She also mentioned being on Eliquis since 2021 approx due to a history of DVT and the presence of an active port. She was unfamiliar with duration of medication. The patient also reported occasional swelling in a vein on the arm where a previous blood clot occurred, describing it as appearing larger at times, particularly when aching. However, the swelling does not persist. The patient expressed concern about this symptom, seeking reassurance that it was not indicative of a significant issue.  The patient expressed uncertainty about the necessity of certain medications, including metoprolol, which was initially prescribed for elevated heart rate and blood pressure control.  The patient also discussed her cholesterol levels, specifically elevated triglycerides, which she was concerned might be hereditary. She expressed reluctance to take additional supplements to manage this, due to previous adverse reactions to various supplements.  The patient also mentioned an alpha-gal allergy, which has implications for her flu vaccination. She expressed a desire to receive the flu shot but needed to confirm which version of the vaccine was safe for her due to her allergy.  Lastly, the patient mentioned a need for an EpiPen due to an expired one, and a desire to have these available in her car and purse for emergencies. She also discussed her upcoming blood work, scheduled for  December, and expressed a desire for the results to be reviewed.      Ovarian  Cancer, recurrent Initial diagnosis 2011, has had 5 recurrences since that time. Followed by [facility] Oncology and General Surgery - Dr Ginnie Smart She describes high metabolism or reactions and she has done well, and she knows her body best Recently completed ileostomy, now following with Lorella Nimrod PA. She had issue with colostomy, has had surgery for colostomy reversal 10/2022 She has difficulty with vitamin deficiency /    Followed by [facility] Hematology, Dr Bennett Scrape Has improved w/ infusions and treatment   Alpha Gal Followed by Dr Simmie Davies [facility] Allergy   Has Ativan AS NEEDED for procedural ONLY    Health Maintenance:  Defer Flu Vaccin due to concern for alpha gal allergy     05/31/2023   10:42 AM 10/18/2022   10:04 AM 04/18/2022    1:19 PM  Depression screen PHQ 2/9  Decreased Interest 0 0 0  Down, Depressed, Hopeless 0 0 0  PHQ - 2 Score 0 0 0  Altered sleeping 0 0 0  Tired, decreased energy 0 0 0  Change in appetite 0 0 0  Feeling bad or failure about yourself  0 0 0  Trouble concentrating 0 0 0  Moving slowly or fidgety/restless 0 0 0  Suicidal thoughts 0 0 0  PHQ-9 Score 0 0 0  Difficult doing work/chores  Not difficult at all Not difficult at all       05/31/2023   10:42 AM 10/18/2022   10:04 AM 04/18/2022    1:20 PM  04/01/2022    8:53 AM  GAD 7 : Generalized Anxiety Score  Nervous, Anxious, on Edge 0 0 0 0  Control/stop worrying 0 0 0 0  Worry too much - different things 0 0 0 0  Trouble relaxing 0 0 0 0  Restless 0 0 0 0  Easily annoyed or irritable 0 0 0 0  Afraid - awful might happen 0 0 0 0  Total GAD 7 Score 0 0 0 0  Anxiety Difficulty  Not difficult at all Not difficult at all Not difficult at all     Past Medical History:  Diagnosis Date   Asthma    Atherosclerosis of right carotid artery    Blood clot in vein    Hyperlipidemia    Hypertension    Hypomagnesemia     Iron deficiency anemia    Ovarian cancer (HCC)    Past Surgical History:  Procedure Laterality Date   APPENDECTOMY     ILEOSTOMY     TOTAL ABDOMINAL HYSTERECTOMY W/ BILATERAL SALPINGOOPHORECTOMY     Social History   Socioeconomic History   Marital status: Married    Spouse name: Not on file   Number of children: 2   Years of education: Not on file   Highest education level: Bachelor's degree (e.g., BA, AB, BS)  Occupational History   Not on file  Tobacco Use   Smoking status: Never   Smokeless tobacco: Never  Vaping Use   Vaping status: Never Used  Substance and Sexual Activity   Alcohol use: Yes    Alcohol/week: 0.0 standard drinks of alcohol    Comment: rare   Drug use: No   Sexual activity: Not on file  Other Topics Concern   Not on file  Social History Narrative   Not on file   Social Determinants of Health   Financial Resource Strain: Low Risk  (05/31/2023)   Overall Financial Resource Strain (CARDIA)    Difficulty of Paying Living Expenses: Not hard at all  Food Insecurity: No Food Insecurity (05/31/2023)   Hunger Vital Sign    Worried About Running Out of Food in the Last Year: Never true    Ran Out of Food in the Last Year: Never true  Transportation Needs: No Transportation Needs (05/31/2023)   PRAPARE - Administrator, Civil Service (Medical): No    Lack of Transportation (Non-Medical): No  Physical Activity: Unknown (05/31/2023)   Exercise Vital Sign    Days of Exercise per Week: 0 days    Minutes of Exercise per Session: Not on file  Stress: No Stress Concern Present (05/31/2023)   [employer] of Occupational Health - Occupational Stress Questionnaire    Feeling of Stress : Only a little  Social Connections: Socially Integrated (05/31/2023)   Social Connection and Isolation Panel [NHANES]    Frequency of Communication with Friends and Family: More than three times a week    Frequency of Social Gatherings with Friends and Family:  More than three times a week    Attends Religious Services: More than 4 times per year    Active Member of [employer] or Organizations: Yes    Attends Engineer, structural: More than 4 times per year    Marital Status: Married  Catering manager Violence: Not on file   Family History  Problem Relation Age of Onset   Thyroid disease Mother    Mental illness Mother    Diverticulitis Mother    Diverticulitis Father  Heart disease Father    Hypertension Father    Stroke Paternal Grandmother    Stroke Paternal Grandfather    Current Outpatient Medications on File Prior to Visit  Medication Sig   colesevelam (WELCHOL) 625 MG tablet Take 625 mg by mouth 2 (two) times daily with a meal.   cromolyn (GASTROCROM) 100 MG/5ML solution Take 200 mg by mouth 4 (four) times daily as needed.   famotidine (PEPCID) 20 MG tablet Take 20 mg by mouth 2 (two) times daily as needed.   ibuprofen (ADVIL) 600 MG tablet Take by mouth.   LORazepam (ATIVAN) 0.5 MG tablet Take 0.5 mg by mouth 3 (three) times daily as needed.   metoprolol succinate (TOPROL-XL) 25 MG 24 hr tablet Take 0.5 tablets (12.5 mg total) by mouth daily.   No current facility-administered medications on file prior to visit.    Review of Systems  Constitutional:  Negative for activity change, appetite change, chills, diaphoresis, fatigue and fever.  HENT:  Negative for congestion and hearing loss.   Eyes:  Negative for visual disturbance.  Respiratory:  Negative for cough, chest tightness, shortness of breath and wheezing.   Cardiovascular:  Negative for chest pain, palpitations and leg swelling.  Gastrointestinal:  Negative for abdominal pain, constipation, diarrhea, nausea and vomiting.  Genitourinary:  Negative for dysuria, frequency and hematuria.  Musculoskeletal:  Negative for arthralgias and neck pain.  Skin:  Negative for rash.  Neurological:  Negative for dizziness, weakness, light-headedness, numbness and headaches.   Hematological:  Negative for adenopathy.  Psychiatric/Behavioral:  Negative for behavioral problems, dysphoric mood and sleep disturbance.    Per HPI unless specifically indicated above     Objective:    BP 138/84   Ht 5' 5.5" (1.664 m)   Wt 164 lb (74.4 kg)   BMI 26.88 kg/m   Wt Readings from Last 3 Encounters:  05/31/23 164 lb (74.4 kg)  02/28/23 162 lb (73.5 kg)  10/18/22 156 lb 3.2 oz (70.9 kg)    Physical Exam Vitals and nursing note reviewed.  Constitutional:      General: She is not in acute distress.    Appearance: She is well-developed. She is not diaphoretic.     Comments: Well-appearing, comfortable, cooperative  HENT:     Head: Normocephalic and atraumatic.  Eyes:     General:        Right eye: No discharge.        Left eye: No discharge.     Conjunctiva/sclera: Conjunctivae normal.     Pupils: Pupils are equal, round, and reactive to light.  Neck:     Thyroid: No thyromegaly.  Cardiovascular:     Rate and Rhythm: Normal rate and regular rhythm.     Pulses: Normal pulses.     Heart sounds: Normal heart sounds. No murmur heard. Pulmonary:     Effort: Pulmonary effort is normal. No respiratory distress.     Breath sounds: Normal breath sounds. No wheezing or rales.  Abdominal:     General: Bowel sounds are normal. There is no distension.     Palpations: Abdomen is soft. There is no mass.     Tenderness: There is no abdominal tenderness.  Musculoskeletal:        General: No tenderness. Normal range of motion.     Cervical back: Normal range of motion and neck supple.     Right lower leg: No edema.     Left lower leg: No edema.     Comments:  Upper / Lower Extremities: - Normal muscle tone, strength bilateral upper extremities 5/5, lower extremities 5/5  Lymphadenopathy:     Cervical: No cervical adenopathy.  Skin:    General: Skin is warm and dry.     Findings: No erythema or rash.  Neurological:     Mental Status: She is alert and oriented to  person, place, and time.     Comments: Distal sensation intact to light touch all extremities  Psychiatric:        Mood and Affect: Mood normal.        Behavior: Behavior normal.        Thought Content: Thought content normal.     Comments: Well groomed, good eye contact, normal speech and thoughts     Results for orders placed or performed in visit on 02/07/23  HM MAMMOGRAPHY  Result Value Ref Range   HM Mammogram 0-4 Bi-Rad 0-4 Bi-Rad, Self Reported Normal      Assessment & Plan:   Problem List Items Addressed This Visit     Atherosclerosis of right carotid artery   Relevant Medications   EPINEPHrine (EPIPEN 2-PAK) 0.3 mg/0.3 mL IJ SOAJ injection   apixaban (ELIQUIS) 5 MG TABS tablet   metoprolol succinate (TOPROL-XL) 25 MG 24 hr tablet   Carotid stenosis   Relevant Medications   EPINEPHrine (EPIPEN 2-PAK) 0.3 mg/0.3 mL IJ SOAJ injection   apixaban (ELIQUIS) 5 MG TABS tablet   metoprolol succinate (TOPROL-XL) 25 MG 24 hr tablet   Essential hypertension   Relevant Medications   EPINEPHrine (EPIPEN 2-PAK) 0.3 mg/0.3 mL IJ SOAJ injection   apixaban (ELIQUIS) 5 MG TABS tablet   metoprolol succinate (TOPROL-XL) 25 MG 24 hr tablet   Other Relevant Orders   COMPLETE METABOLIC PANEL WITH GFR   Iron deficiency anemia   Peripheral neuropathy due to chemotherapy [facility])   Other Visit Diagnoses     Annual physical exam    -  Primary   Needs flu shot       History of anaphylaxis       Relevant Medications   EPINEPHrine (EPIPEN 2-PAK) 0.3 mg/0.3 mL IJ SOAJ injection        Updated Health Maintenance information Reviewed recent lab results with patient Encouraged improvement to lifestyle with diet and exercise Goal of weight loss   Hypertriglyceridemia Triglycerides slightly elevated at 358. Discussed potential hereditary component and current threshold for intervention. No current need for medication intervention. Cannot tolerate supplements  Anticoagulation, long  term 1st only DVT subclavian provoked by port / active cancer.  Hematology at [facility] in 2022 - On Eliquis due to history of DVT, port in place, active cancer.  Discussed continuation of Eliquis as long as port is in place and cancer is active. -Continue Eliquis. Refill prescription.  Hypertension/Tachycardia On low dose Metoprolol for blood pressure control and heart rate control. Discussed benefits of medication and potential for future reassessment. -Continue Metoprolol half tablet daily XL daily  Varicose Vein Noted varicose vein likely secondary to previous DVT. No current need for intervention. -Continue to monitor.  General Health Maintenance -Defer flu vaccine due to concern for alpha-gal allergy. Patient to investigate suitable vaccine. -Next follow-up appointment in May 2025.         Orders Placed This Encounter  Procedures   COMPLETE METABOLIC PANEL WITH GFR    Meds ordered this encounter  Medications   EPINEPHrine (EPIPEN 2-PAK) 0.3 mg/0.3 mL IJ SOAJ injection    Sig: Inject 0.3 mg into  the muscle as needed for anaphylaxis.    Dispense:  2 each    Refill:  2    1 pack for car and 1 pack for other location   apixaban (ELIQUIS) 5 MG TABS tablet    Sig: Take 1 tablet (5 mg total) by mouth 2 (two) times daily.    Dispense:  180 tablet    Refill:  3    New provider ordering, add refills     Follow up plan: Return in about 6 months (around 11/28/2023) for 6  month follow-up Oncology updates (any day, 1st apt after lunch).  Saralyn Pilar, DO [facility] 05/31/2023, 10:44 AM

## 2023-05-31 NOTE — Patient Instructions (Addendum)
Thank you for coming to the office today.  Trivalent Flu vaccine here  Please schedule a Follow-up Appointment to: Return in about 6 months (around 11/28/2023) for 6  month follow-up Oncology updates (any day, 1st apt after lunch).  If you have any other questions or concerns, please feel free to call the office or send a message through MyChart. You may also schedule an earlier appointment if necessary.  Additionally, you may be receiving a survey about your experience at our office within a few days to 1 week by e-mail or mail. We value your feedback.  Saralyn Pilar, DO [facility], [place]

## 2023-06-07 ENCOUNTER — Ambulatory Visit (INDEPENDENT_AMBULATORY_CARE_PROVIDER_SITE_OTHER): Payer: Commercial Managed Care - PPO

## 2023-06-07 DIAGNOSIS — Z23 Encounter for immunization: Secondary | ICD-10-CM | POA: Diagnosis not present

## 2023-09-24 ENCOUNTER — Encounter: Payer: Self-pay | Admitting: Family Medicine

## 2023-10-24 ENCOUNTER — Encounter: Payer: Self-pay | Admitting: Family Medicine

## 2023-11-28 ENCOUNTER — Other Ambulatory Visit: Payer: Self-pay | Admitting: Family Medicine

## 2023-11-28 ENCOUNTER — Ambulatory Visit (INDEPENDENT_AMBULATORY_CARE_PROVIDER_SITE_OTHER): Payer: Self-pay | Admitting: Family Medicine

## 2023-11-28 ENCOUNTER — Encounter: Payer: Self-pay | Admitting: Family Medicine

## 2023-11-28 VITALS — BP 142/70 | HR 107 | Ht 65.5 in | Wt 167.2 lb

## 2023-11-28 DIAGNOSIS — G620 Drug-induced polyneuropathy: Secondary | ICD-10-CM

## 2023-11-28 DIAGNOSIS — D500 Iron deficiency anemia secondary to blood loss (chronic): Secondary | ICD-10-CM

## 2023-11-28 DIAGNOSIS — Z0000 Encounter for general adult medical examination without abnormal findings: Secondary | ICD-10-CM

## 2023-11-28 DIAGNOSIS — E7521 Fabry (-Anderson) disease: Secondary | ICD-10-CM

## 2023-11-28 DIAGNOSIS — C569 Malignant neoplasm of unspecified ovary: Secondary | ICD-10-CM

## 2023-11-28 DIAGNOSIS — I6521 Occlusion and stenosis of right carotid artery: Secondary | ICD-10-CM

## 2023-11-28 DIAGNOSIS — I10 Essential (primary) hypertension: Secondary | ICD-10-CM

## 2023-11-28 DIAGNOSIS — K5229 Other allergic and dietetic gastroenteritis and colitis: Secondary | ICD-10-CM

## 2023-11-28 DIAGNOSIS — R7309 Other abnormal glucose: Secondary | ICD-10-CM

## 2023-11-28 DIAGNOSIS — K582 Mixed irritable bowel syndrome: Secondary | ICD-10-CM

## 2023-11-28 DIAGNOSIS — R152 Fecal urgency: Secondary | ICD-10-CM

## 2023-11-28 DIAGNOSIS — Z9889 Other specified postprocedural states: Secondary | ICD-10-CM

## 2023-11-28 MED ORDER — METOPROLOL SUCCINATE ER 25 MG PO TB24: 12.5000 mg | ORAL_TABLET | Freq: Every day | ORAL | 1 refills | Status: DC

## 2023-11-28 NOTE — Patient Instructions (Addendum)
 Thank you for coming to the office today.  Please proceed with your Alpha Gal Specialist - Dr Alcario Alvine [facility] Allergy Trial on Xolair for food allergy diarrhea symptoms If you want to reconsider GI specialist follow-up in future we can look into options. I would agree with consultation in the future if you still have symptoms  I suggest [facility]  Labs for next time  Complete Metabolic Panel (CMP) Complete Blood   DUE for FASTING BLOOD WORK (no food or drink after midnight before the lab appointment, only water or coffee without cream/sugar on the morning of)  SCHEDULE "Lab Only" visit in the morning at the clinic for lab draw in 6 MONTHS   - Make sure Lab Only appointment is at about 1 week before your next appointment, so that results will be available  For Lab Results, once available within 2-3 days of blood draw, you can can log in to MyChart online to view your results and a brief explanation. Also, we can discuss results at next follow-up visit.   Please schedule a Follow-up Appointment to: Return for 6 month fasting lab > 1 week later Annual Physical.  If you have any other questions or concerns, please feel free to call the office or send a message through MyChart. You may also schedule an earlier appointment if necessary.  Additionally, you may be receiving a survey about your experience at our office within a few days to 1 week by e-mail or mail. We value your feedback.  Domingo Friend, DO [facility], [place]

## 2023-11-28 NOTE — Progress Notes (Signed)
 Subjective:    Patient ID: Diane Acosta, female    DOB: 04-02-1961, 63 y.o.   MRN: [id number]  Diane Acosta is a 63 y.o. female presenting on 11/28/2023 for Hypertension; Alpha Gal, Food Allergy, Fecal Urgency, Diarrhea; and Ovarian Cancer   HPI  Discussed the use of AI scribe software for clinical note transcription with the patient, who gave verbal consent to proceed.  History of Present Illness   Diane Acosta is a 63 year old female with ovarian cancer and alpha-gal syndrome who presents for a six-month follow-up and evaluation of gastrointestinal symptoms.   She reports variable sleep patterns, sometimes sleeping through the night and other times waking after four hours. She does not work, which allows her to adjust her sleep schedule as needed.     Ovarian  Cancer, recurrent Initial diagnosis 2011, has had 5 recurrences since that time. Followed by [facility] Oncology and General Surgery -[facility]  Dr Melvina Stage  She describes high metabolism or reactions and she has done well, and she knows her body best History of 10/2022 ileostomy, following with Teresa Sadiq PA. She had issue with colostomy, has had surgery for colostomy reversal 10/2022  Chronic GI Symptoms / Diarrhea / urgency Alpha Gal / Food Allergy She experiences ongoing gastrointestinal issues, primarily characterized by diarrhea and urgency, which have not improved despite current treatments. Her history of ovarian cancer, which was located on the outside and not in the colon, and her ileostomy may contribute to these symptoms. Alpha-gal syndrome complicates her dietary intake and management of symptoms.  Alpha Gal [facility] Allergist Immunology specialist Dr Alcario Alvine - Upcoming treatment plan with Xolair (monoclonal antibody) approved, waiting to schedule injections  CHRONIC HTN: Her blood pressure was elevated during the visit, which she attributes to stress and recent interactions with her father. She is  currently on a low dose of metoprolol , 25 mg, half a pill, and has a history of trying various blood pressure medications due to fluctuations related to her cancer treatments. She has a history of anxiety related to medical visits, which she believes contributes to her elevated blood pressure readings during appointments.  Current Meds - Metoprolol  XL 12.5mg  (half of 25mg )  Reports good compliance, took meds today. Tolerating well, w/o complaints. Denies CP, dyspnea, HA, edema, dizziness / lightheadedness       11/28/2023    2:10 PM 05/31/2023   10:42 AM 10/18/2022   10:04 AM  Depression screen PHQ 2/9  Decreased Interest 0 0 0  Down, Depressed, Hopeless 0 0 0  PHQ - 2 Score 0 0 0  Altered sleeping 1 0 0  Tired, decreased energy 0 0 0  Change in appetite 0 0 0  Feeling bad or failure about yourself  0 0 0  Trouble concentrating 0 0 0  Moving slowly or fidgety/restless 0 0 0  Suicidal thoughts 0 0 0  PHQ-9 Score 1 0 0  Difficult doing work/chores Not difficult at all  Not difficult at all       11/28/2023    2:10 PM 05/31/2023   10:42 AM 10/18/2022   10:04 AM 04/18/2022    1:20 PM  GAD 7 : Generalized Anxiety Score  Nervous, Anxious, on Edge 0 0 0 0  Control/stop worrying 0 0 0 0  Worry too much - different things 0 0 0 0  Trouble relaxing 0 0 0 0  Restless 0 0 0 0  Easily annoyed or irritable 1 0 0  0  Afraid - awful might happen 0 0 0 0  Total GAD 7 Score 1 0 0 0  Anxiety Difficulty Not difficult at all  Not difficult at all Not difficult at all    Social History   Tobacco Use   Smoking status: Never   Smokeless tobacco: Never  Vaping Use   Vaping status: Never Used  Substance Use Topics   Alcohol use: Yes    Alcohol/week: 0.0 standard drinks of alcohol    Comment: rare   Drug use: No    Review of Systems Per HPI unless specifically indicated above     Objective:     BP (!) 142/70 (BP Location: Left Arm, Cuff Size: Normal)   Pulse (!) 107   Ht 5' 5.5"  (1.664 m)   Wt 167 lb 4 oz (75.9 kg)   SpO2 97%   BMI 27.41 kg/m   Wt Readings from Last 3 Encounters:  11/28/23 167 lb 4 oz (75.9 kg)  05/31/23 164 lb (74.4 kg)  02/28/23 162 lb (73.5 kg)    Physical Exam Vitals and nursing note reviewed.  Constitutional:      General: She is not in acute distress.    Appearance: Normal appearance. She is well-developed. She is not diaphoretic.     Comments: Well-appearing, comfortable, cooperative  HENT:     Head: Normocephalic and atraumatic.  Eyes:     General:        Right eye: No discharge.        Left eye: No discharge.     Conjunctiva/sclera: Conjunctivae normal.  Cardiovascular:     Rate and Rhythm: Normal rate.  Pulmonary:     Effort: Pulmonary effort is normal.  Skin:    General: Skin is warm and dry.     Findings: No erythema or rash.  Neurological:     Mental Status: She is alert and oriented to person, place, and time.  Psychiatric:        Mood and Affect: Mood normal.        Behavior: Behavior normal.        Thought Content: Thought content normal.     Comments: Well groomed, good eye contact, normal speech and thoughts     Results for orders placed or performed in visit on 02/07/23  HM MAMMOGRAPHY   Collection Time: 02/07/23 12:00 AM  Result Value Ref Range   HM Mammogram 0-4 Bi-Rad 0-4 Bi-Rad, Self Reported Normal      Assessment & Plan:   Problem List Items Addressed This Visit     Alpha-galactosidase A deficiency (HCC)   Relevant Medications   metoprolol  succinate (TOPROL -XL) 25 MG 24 hr tablet   Essential hypertension   Relevant Medications   metoprolol  succinate (TOPROL -XL) 25 MG 24 hr tablet   History of ileostomy   Iron deficiency anemia   Malignant neoplasm of ovary (HCC)   Relevant Medications   letrozole (FEMARA) 2.5 MG tablet   Ovarian carcinoma, epithelial (HCC)   Relevant Medications   letrozole (FEMARA) 2.5 MG tablet   Peripheral neuropathy due to chemotherapy [facility])   Other Visit  Diagnoses       Fecal urgency    -  Primary     Gastrointestinal food allergy syndrome         Irritable bowel syndrome with both constipation and diarrhea            Ovarian cancer Followed by [facility] G[facility] / Oncology Concerns about future risk recurrence  due to proximity to previous cancer site.  Continue to work w/ GYN on management with Letrozole  Fecal urgency and diarrhea Food Allergy Alpha-Gal syndrome Chronic symptoms possibly related to previous cancer treatment or alpha-gal syndrome - On bile acid medication Colesevelam limited results - Proceed with Xolair treatment under Dr. Alcario Alvine guidance. For food allergy - Consider gastroenterologist referral if symptoms persist post-Xolair. We can consider  GI [place] or other locations possibly [facility] - Evaluate and adjust current medication as necessary.  Hypertension Intermittent elevated readings, likely stress-related. Managed with low-dose metoprolol . Recent reading 142/70, reduced from 160/?Aaron Aas White coat syndrome noted.  - Continue metoprolol  XL 12.5mg  daily (half of 25mg )  - Monitor blood pressure, reassess if persistent elevation. - Consider additional interventions if elevated outside medical settings.        No orders of the defined types were placed in this encounter.   Meds ordered this encounter  Medications   metoprolol  succinate (TOPROL -XL) 25 MG 24 hr tablet    Sig: Take 0.5 tablets (12.5 mg total) by mouth daily.    Dispense:  45 tablet    Refill:  1    Add refill for up to 6 months    Follow up plan: Return in about 6 months (around 05/30/2024) for 6 month Annual Physical (labs with other dr).  Printed labs given to patient. Her specialist will place orders.  Domingo Friend, DO [facility] 11/28/2023, 1:30 PM

## 2024-02-20 LAB — HM MAMMOGRAPHY

## 2024-04-01 ENCOUNTER — Encounter: Payer: Self-pay | Admitting: Family Medicine

## 2024-05-30 ENCOUNTER — Other Ambulatory Visit: Payer: Self-pay | Admitting: Family Medicine

## 2024-05-30 DIAGNOSIS — I10 Essential (primary) hypertension: Secondary | ICD-10-CM

## 2024-05-31 NOTE — Telephone Encounter (Signed)
 Requested Prescriptions  Pending Prescriptions Disp Refills   metoprolol  succinate (TOPROL -XL) 25 MG 24 hr tablet [Pharmacy Med Name: METOPROLOL  ER SUCCINATE 25MG  TABS] 45 tablet 0    Sig: TAKE 1/2 TABLET(12.5 MG) BY MOUTH DAILY     Cardiovascular:  Beta Blockers Failed - 05/31/2024  4:39 PM      Failed - Last BP in normal range    BP Readings from Last 1 Encounters:  11/28/23 (!) 142/70         Failed - Valid encounter within last 6 months    Recent Outpatient Visits           6 months ago Fecal urgency    [facility]a [place], [place]              Passed - Last Heart Rate in normal range    Pulse Readings from Last 1 Encounters:  11/28/23 (!) 107

## 2024-06-04 ENCOUNTER — Encounter: Admitting: Family Medicine

## 2024-06-10 ENCOUNTER — Ambulatory Visit (INDEPENDENT_AMBULATORY_CARE_PROVIDER_SITE_OTHER): Admitting: Family Medicine

## 2024-06-10 VITALS — BP 132/68 | HR 85 | Ht 65.5 in | Wt 170.1 lb

## 2024-06-10 DIAGNOSIS — Z0000 Encounter for general adult medical examination without abnormal findings: Secondary | ICD-10-CM

## 2024-06-10 DIAGNOSIS — C569 Malignant neoplasm of unspecified ovary: Secondary | ICD-10-CM

## 2024-06-10 DIAGNOSIS — Z23 Encounter for immunization: Secondary | ICD-10-CM

## 2024-06-10 DIAGNOSIS — K5229 Other allergic and dietetic gastroenteritis and colitis: Secondary | ICD-10-CM

## 2024-06-10 DIAGNOSIS — D500 Iron deficiency anemia secondary to blood loss (chronic): Secondary | ICD-10-CM | POA: Diagnosis not present

## 2024-06-10 DIAGNOSIS — E7521 Fabry (-Anderson) disease: Secondary | ICD-10-CM

## 2024-06-10 DIAGNOSIS — I6521 Occlusion and stenosis of right carotid artery: Secondary | ICD-10-CM

## 2024-06-10 DIAGNOSIS — I10 Essential (primary) hypertension: Secondary | ICD-10-CM

## 2024-06-10 MED ORDER — METOPROLOL SUCCINATE ER 25 MG PO TB24: 12.5000 mg | ORAL_TABLET | Freq: Every day | ORAL | 3 refills | Status: AC

## 2024-06-10 MED ORDER — APIXABAN 5 MG PO TABS: 5.0000 mg | ORAL_TABLET | Freq: Two times a day (BID) | ORAL | 3 refills | Status: AC

## 2024-06-10 NOTE — Progress Notes (Signed)
 Subjective:    Patient ID: Diane Acosta, female    DOB: 1960-10-11, 63 y.o.   MRN: [id number]  Diane Acosta is a 63 y.o. female presenting on 06/10/2024 for Annual Exam   HPI  Discussed the use of AI scribe software for clinical note transcription with the patient, who gave verbal consent to proceed.  History of Present Illness   Diane Acosta is a 63 year old female who presents for a routine follow-up and discussion of vaccinations.  Metabolic and laboratory findings - Hemoglobin A1c measured at 5.2 in September 2025 - Normal kidney function and thyroid levels - Low LDL cholesterol at 68 - Elevated triglycerides, consistent with familial trend - History of low vitamin D and B12 levels  Supplementation intolerance and considerations - Difficulty tolerating oral vitamin supplements due to prior adverse reactions and alpha-gal syndrome - Considering B12 supplementation via injection due to low levels and intolerance to oral forms  Medication therapy - Metoprolol  12.5 mg for blood pressure management - Eliquis  for anticoagulation - Letrozole, a hormone inhibitor - Iron infusions for anemia  Neuropathy and inflammatory symptoms - Interested in natural supplements for inflammation and neuropathy, including elderberry and turmeric - Considering alpha lipoic acid for neuropathy, but cautious due to prior supplement intolerances      She will get labs via port from her oncology team instead of at our office. Labs at [facility]   Chronic Anticoagulation History of DVT Cancer Port in place Varicose veins   Ovarian  Cancer, recurrent Initial diagnosis 2011, has had 5 recurrences since that time. Followed by [facility]  Dr Lindalee  She describes high metabolism or reactions and she has done well, and she knows her body best History of 10/2022 ileostomy, following with Teresa Sadiq PA. She had issue with colostomy, has  had surgery for colostomy reversal 10/2022   Chronic GI Symptoms / Diarrhea / urgency Alpha Gal / Food Allergy She experiences ongoing gastrointestinal issues, primarily characterized by diarrhea and urgency, which have not improved despite current treatments. Her history of ovarian cancer, which was located on the outside and not in the colon, and her ileostomy may contribute to these symptoms. Alpha-gal syndrome complicates her dietary intake and management of symptoms.   Alpha Gal [facility] Immunology specialist Dr Noralyn   CHRONIC HTN: Controlled Current Meds - Metoprolol  XL 12.5mg  (half of 25mg )  Reports good compliance, took meds today. Tolerating well, w/o complaints. Denies CP, dyspnea, HA, edema, dizziness / lightheadedness   Has Ativan  AS NEEDED for procedural ONLY      Health Maintenance:   Defer Flu Vaccine due to concern for alpha gal allergy Defer Prevnar-20 she will check allergy concerns and return or get it at pharmacy - History of alpha-gal syndrome; plans to verify vaccine safety with allergist prior to administration  Mammogram 02/20/24 negative  Last Colonoscopy 05/11/22 Negative. To be determined on next repeat for Colonoscopy possibly now switch to [facility]      06/10/2024   10:11 AM 11/28/2023    2:10 PM 05/31/2023   10:42 AM  Depression screen PHQ 2/9  Decreased Interest 0 0 0  Down, Depressed, Hopeless 0 0 0  PHQ - 2 Score 0 0 0  Altered sleeping  1 0  Tired, decreased energy  0 0  Change in appetite  0 0  Feeling bad or failure about yourself   0 0  Trouble concentrating  0 0  Moving slowly or fidgety/restless  0 0  Suicidal thoughts  0 0  PHQ-9 Score  1  0   Difficult doing work/chores  Not difficult at all      Data saved with a previous flowsheet row definition       06/10/2024   10:11 AM 11/28/2023    2:10 PM 05/31/2023   10:42 AM 10/18/2022   10:04 AM  GAD 7 : Generalized Anxiety Score  Nervous, Anxious, on Edge 0 0 0 0   Control/stop worrying 0 0 0 0  Worry too much - different things 0 0 0 0  Trouble relaxing 0 0 0 0  Restless 0 0 0 0  Easily annoyed or irritable 0 1 0 0  Afraid - awful might happen 0 0 0 0  Total GAD 7 Score 0 1 0 0  Anxiety Difficulty  Not difficult at all  Not difficult at all     Past Medical History:  Diagnosis Date   Asthma    Atherosclerosis of right carotid artery    Blood clot in vein    Hyperlipidemia    Hypertension    Hypomagnesemia    Iron deficiency anemia    Ovarian cancer (HCC)    Past Surgical History:  Procedure Laterality Date   APPENDECTOMY     ILEOSTOMY     TOTAL ABDOMINAL HYSTERECTOMY W/ BILATERAL SALPINGOOPHORECTOMY     Social History   Socioeconomic History   Marital status: Married    Spouse name: Not on file   Number of children: 2   Years of education: Not on file   Highest education level: Bachelor's degree (e.g., BA, AB, BS)  Occupational History   Not on file  Tobacco Use   Smoking status: Never   Smokeless tobacco: Never  Vaping Use   Vaping status: Never Used  Substance and Sexual Activity   Alcohol use: Yes    Alcohol/week: 0.0 standard drinks of alcohol    Comment: rare   Drug use: No   Sexual activity: Not on file  Other Topics Concern   Not on file  Social History Narrative   Not on file   Social Drivers of Health   Financial Resource Strain: Low Risk  (06/10/2024)   Overall Financial Resource Strain (CARDIA)    Difficulty of Paying Living Expenses: Not very hard  Food Insecurity: No Food Insecurity (06/10/2024)   Hunger Vital Sign    Worried About Running Out of Food in the Last Year: Never true    Ran Out of Food in the Last Year: Never true  Transportation Needs: No Transportation Needs (06/10/2024)   PRAPARE - Administrator, Civil Service (Medical): No    Lack of Transportation (Non-Medical): No  Physical Activity: Inactive (06/10/2024)   Exercise Vital Sign    Days of Exercise per Week: 0 days     Minutes of Exercise per Session: Not on file  Stress: No Stress Concern Present (06/10/2024)   [employer] of Occupational Health - Occupational Stress Questionnaire    Feeling of Stress: Only a little  Social Connections: Socially Integrated (06/10/2024)   Social Connection and Isolation Panel    Frequency of Communication with Friends and Family: More than three times a week    Frequency of Social Gatherings with Friends and Family: Once a week    Attends Religious Services: More than 4 times per year    Active Member of [employer] or Organizations: Yes  Attends Banker Meetings: More than 4 times per year    Marital Status: Married  Catering Manager Violence: Not At Risk (04/04/2024)   Received from [facility]   Humiliation, Afraid, Rape, and Kick questionnaire    Within the last year, have you been afraid of your partner or ex-partner?: No    Within the last year, have you been humiliated or emotionally abused in other ways by your partner or ex-partner?: No    Within the last year, have you been kicked, hit, slapped, or otherwise physically hurt by your partner or ex-partner?: No    Within the last year, have you been raped or forced to have any kind of sexual activity by your partner or ex-partner?: No   Family History  Problem Relation Age of Onset   Thyroid disease Mother    Mental illness Mother    Diverticulitis Mother    Diverticulitis Father    Heart disease Father    Hypertension Father    Stroke Paternal Grandmother    Stroke Paternal Grandfather    Current Outpatient Medications on File Prior to Visit  Medication Sig   colesevelam (WELCHOL) 625 MG tablet Take 625 mg by mouth 2 (two) times daily with a meal.   EPINEPHrine  (EPIPEN  2-PAK) 0.3 mg/0.3 mL IJ SOAJ injection Inject 0.3 mg into the muscle as needed for anaphylaxis.   famotidine (PEPCID) 20 MG tablet Take 20 mg by mouth 2 (two) times daily as needed.   ibuprofen (ADVIL) 600 MG tablet Take by  mouth.   letrozole (FEMARA) 2.5 MG tablet Take 2.5 mg by mouth daily.   LORazepam  (ATIVAN ) 0.5 MG tablet Take 0.5 mg by mouth 3 (three) times daily as needed.   No current facility-administered medications on file prior to visit.    Review of Systems  Constitutional:  Negative for activity change, appetite change, chills, diaphoresis, fatigue and fever.  HENT:  Negative for congestion and hearing loss.   Eyes:  Negative for visual disturbance.  Respiratory:  Negative for cough, chest tightness, shortness of breath and wheezing.   Cardiovascular:  Negative for chest pain, palpitations and leg swelling.  Gastrointestinal:  Negative for abdominal pain, constipation, diarrhea, nausea and vomiting.  Genitourinary:  Negative for dysuria, frequency and hematuria.  Musculoskeletal:  Negative for arthralgias and neck pain.  Skin:  Negative for rash.  Neurological:  Negative for dizziness, weakness, light-headedness, numbness and headaches.  Hematological:  Negative for adenopathy.  Psychiatric/Behavioral:  Negative for behavioral problems, dysphoric mood and sleep disturbance.    Per HPI unless specifically indicated above     Objective:    BP 132/68 (BP Location: Left Arm, Patient Position: Sitting, Cuff Size: Normal)   Pulse 85   Ht 5' 5.5 (1.664 m)   Wt 170 lb 2 oz (77.2 kg)   SpO2 97%   BMI 27.88 kg/m   Wt Readings from Last 3 Encounters:  06/10/24 170 lb 2 oz (77.2 kg)  11/28/23 167 lb 4 oz (75.9 kg)  05/31/23 164 lb (74.4 kg)    Physical Exam Vitals and nursing note reviewed.  Constitutional:      General: She is not in acute distress.    Appearance: She is well-developed. She is not diaphoretic.     Comments: Well-appearing, comfortable, cooperative  HENT:     Head: Normocephalic and atraumatic.  Eyes:     General:        Right eye: No discharge.  Left eye: No discharge.     Conjunctiva/sclera: Conjunctivae normal.     Pupils: Pupils are equal, round, and  reactive to light.  Neck:     Thyroid: No thyromegaly.     Vascular: No carotid bruit.  Cardiovascular:     Rate and Rhythm: Normal rate and regular rhythm.     Pulses: Normal pulses.     Heart sounds: Normal heart sounds. No murmur heard. Pulmonary:     Effort: Pulmonary effort is normal. No respiratory distress.     Breath sounds: Normal breath sounds. No wheezing or rales.  Abdominal:     General: Bowel sounds are normal. There is no distension.     Palpations: Abdomen is soft. There is no mass.     Tenderness: There is no abdominal tenderness.  Musculoskeletal:        General: No tenderness. Normal range of motion.     Cervical back: Normal range of motion and neck supple.     Right lower leg: No edema.     Left lower leg: No edema.     Comments: Upper / Lower Extremities: - Normal muscle tone, strength bilateral upper extremities 5/5, lower extremities 5/5  Lymphadenopathy:     Cervical: No cervical adenopathy.  Skin:    General: Skin is warm and dry.     Findings: No erythema or rash.  Neurological:     Mental Status: She is alert and oriented to person, place, and time.     Comments: Distal sensation intact to light touch all extremities  Psychiatric:        Mood and Affect: Mood normal.        Behavior: Behavior normal.        Thought Content: Thought content normal.     Comments: Well groomed, good eye contact, normal speech and thoughts      05/11/2022 11:42 AM EDT  _______________________________________________________________________________ Patient Name: Diane Acosta              Procedure Date: 05/11/2022 10:49 AM MRN: [id number]                     Date of Birth: 03-Aug-1960 Admit Type: Outpatient                Age: 29 Room: GI MMNT OR 02 [facility]              Gender: Female Note Status: Finalized                Instrument Name: PCF (H190DL) [id number] ([id number])  [id number] _______________________________________________________________________________  Procedure:             Colonoscopy Indications:           Abnormal PET scan of the GI tract Providers:             LISA EARNIE HENLE, MD, JOSHUA LOCKHART HUDSON, MD                       (Fellow), JAIME G. KENNEDY, RN, DORIS D. ALLEN Referring MD:          RICHERD MELIA MULBERRY, MD (Referring MD) Medicines:             Monitored Anesthesia Care Complications:         No immediate complications. _______________________________________________________________________________ Requesting Provider:   Procedure:             Pre-Anesthesia Assessment:                       -  Prior to the procedure, a History and Physical was                       performed, and patient medications and allergies were                       reviewed. The patient's tolerance of previous                       anesthesia was also reviewed. The risks and benefits                       of the procedure and the sedation options and risks                       were discussed with the patient. All questions were                       answered, and informed consent was obtained. Prior                       Anticoagulants: The patient has taken Eliquis                        (apixaban ), last dose was 3 days prior to procedure.                       ASA Grade Assessment: III - A patient with severe                       systemic disease. After reviewing the risks and                       benefits, the patient was deemed in satisfactory                       condition to undergo the procedure.                       After I obtained informed consent, the scope was                       passed under direct vision. Throughout the procedure,                       the patient's blood pressure, pulse, and oxygen                       saturations were monitored continuously. The                       Colonoscope was introduced through the  anus and                       advanced to the the sigmoid colon. The Endoscope was                       introduced through the anus and advanced to the the                       cecum, identified by appendiceal orifice and ileocecal  valve. The colonoscopy was performed without                       difficulty. The patient tolerated the procedure well.                       The quality of the bowel preparation was evaluated                       using the BBPS [facility] Bowel Preparation Scale) with                       scores of: Right Colon = 3, Transverse Colon = 3 and                       Left Colon = 3 (entire mucosa seen well with no                       residual staining, small fragments of stool or opaque                       liquid). The total BBPS score equals 9. The ileocecal                       valve, appendiceal orifice, and rectum were                       photographed. Findings:     The perianal and digital rectal examinations were normal.     A malignant-appearing, intrinsic moderate stenosis measuring 5 cm (in     length) x 1 cm (inner diameter) was found in the recto-sigmoid colon     (10-15 cm from anus) and was traversed. Biopsies were taken with a cold     forceps for histology.     Multiple small-mouthed diverticula were found in the sigmoid colon and     descending colon.     The exam was otherwise without abnormality.                                                                               Estimated Blood Loss:     Estimated blood loss was minimal. Impression:            - Malignant appearing stricture in the recto-sigmoid                       colon, likely related to ovarian cancer. Biopsied.                       - Diverticulosis in the sigmoid colon and in the                       descending colon.                       - The examination was otherwise normal. Recommendation:        - Patient has  a contact number available  for                       emergencies. The signs and symptoms of potential                       delayed complications were discussed with the patient.                       Return to normal activities tomorrow. Written                       discharge instructions were provided to the patient.                       - Resume previous diet.                       - Continue present medications.                       - Await pathology results.                       - No recommendation at this time regarding repeat                       colonoscopy. Procedure Code(s):     --- Professional ---                       [phone], Colonoscopy, flexible; with biopsy, single or                       multiple Diagnosis Code(s):     --- Professional ---                       D49.0, Neoplasm of unspecified behavior of digestive                       system                       K56.699, Other intestinal obstruction unspecified as                       to partial versus complete obstruction  CPT copyright 2022 American Medical Association. All rights reserved.  The codes documented in this report are preliminary and upon coder review may be revised to meet current compliance requirements.  Electronically Signed  by Olam HERO. Enos, MD ________________________ OLAM EARNIE ENOS, MD 05/11/2022 11:38:08 AM The attending physician was present throughout the entire procedure including the insertion, viewing, and removal of the endoscope. This procedure note has been electronically signed by: OLAM ENOS , MD  __________________________ FONDA VERLENA BLOCH, MD Number of Addenda: 0  Note Initiated On: 05/11/2022 10:49 AM    Imaging Results - Colonoscopy (05/11/2022 10:49 AM EDT) Procedure Note  Enos Olam Earnie, MD - 05/11/2022  Formatting of this note might be different from the original. _______________________________________________________________________________ Patient Name: Diane Acosta Procedure Date: 05/11/2022 10:49 AM MRN: [id number] Date of Birth: [birth year] Admit Type: Outpatient Age: 78 Room: GI MMNT OR 02 [facility] Gender: Female Note Status: Finalized Instrument Name: PCF (H190DL) [id number] ([id number]) [id number] _______________________________________________________________________________  Procedure: Colonoscopy Indications:  Abnormal PET scan of the GI tract Providers: LISA EARNIE HENLE, MD, JOSHUA VERLENA BLOCH, MD (Fellow), JAIME G. KENNEDY, RN, DORIS D. ALLEN Referring MD: RICHERD MELIA MULBERRY, MD (Referring MD) Medicines: Monitored Anesthesia Care Complications: No immediate complications. _______________________________________________________________________________ Requesting Provider: Procedure: Pre-Anesthesia Assessment: - Prior to the procedure, a History and Physical was performed, and patient medications and allergies were reviewed. The patient's tolerance of previous anesthesia was also reviewed. The risks and benefits of the procedure and the sedation options and risks were discussed with the patient. All questions were answered, and informed consent was obtained. Prior Anticoagulants: The patient has taken Eliquis  (apixaban ), last dose was 3 days prior to procedure. ASA Grade Assessment: III - A patient with severe systemic disease. After reviewing the risks and benefits, the patient was deemed in satisfactory condition to undergo the procedure. After I obtained informed consent, the scope was passed under direct vision. Throughout the procedure, the patient's blood pressure, pulse, and oxygen saturations were monitored continuously. The Colonoscope was introduced through the anus and advanced to the the sigmoid colon. The Endoscope was introduced through the anus and advanced to the the cecum, identified by appendiceal orifice and ileocecal valve. The colonoscopy was performed without difficulty. The patient tolerated the  procedure well. The quality of the bowel preparation was evaluated using the BBPS [facility] Bowel Preparation Scale) with scores of: Right Colon = 3, Transverse Colon = 3 and Left Colon = 3 (entire mucosa seen well with no residual staining, small fragments of stool or opaque liquid). The total BBPS score equals 9. The ileocecal valve, appendiceal orifice, and rectum were photographed. Findings: The perianal and digital rectal examinations were normal. A malignant-appearing, intrinsic moderate stenosis measuring 5 cm (in length) x 1 cm (inner diameter) was found in the recto-sigmoid colon (10-15 cm from anus) and was traversed. Biopsies were taken with a cold forceps for histology. Multiple small-mouthed diverticula were found in the sigmoid colon and descending colon. The exam was otherwise without abnormality.  Estimated Blood Loss: Estimated blood loss was minimal. Impression: - Malignant appearing stricture in the recto-sigmoid colon, likely related to ovarian cancer. Biopsied. - Diverticulosis in the sigmoid colon and in the descending colon. - The examination was otherwise normal. Recommendation: - Patient has a contact number available for emergencies. The signs and symptoms of potential delayed complications were discussed with the patient. Return to normal activities tomorrow. Written discharge instructions were provided to the patient. - Resume previous diet. - Continue present medications. - Await pathology results. - No recommendation at this time regarding repeat colonoscopy. Procedure Code(s): --- Professional --- [phone], Colonoscopy, flexible; with biopsy, single or multiple Diagnosis Code(s): --- Professional --- D49.0, Neoplasm of unspecified behavior of digestive system K56.699, Other intestinal obstruction unspecified as to partial versus complete obstruction  CPT copyright 2022 American Medical Association. All rights reserved.  The codes documented in  this report are preliminary and upon coder review may be revised to meet current compliance requirements.  Electronically Signed by Olam HERO. Henle, MD ________________________ OLAM EARNIE HENLE, MD 05/11/2022 11:38:08 AM The attending physician was present throughout the entire procedure including the insertion, viewing, and removal of the endoscope. This procedure note has been electronically signed by: OLAM HENLE , MD  __________________________ FONDA VERLENA BLOCH, MD Number of Addenda: 0  Note Initiated On: 05/11/2022 10:49 AM   Imaging Results - Colonoscopy (05/11/2022 10:49 AM EDT) Authorizing Provider Result Type  Richerd Melia Mulberry MD GI PROCEDURE ORDERABLES    Results for orders  placed or performed in visit on 02/20/24  [facility] MAMMOGRAPHY   Collection Time: 02/20/24  2:13 PM  Result Value Ref Range   [facility] Mammogram 0-4 Bi-Rad 0-4 Bi-Rad, Self Reported Normal      Assessment & Plan:   Problem List Items Addressed This Visit     Alpha-galactosidase A deficiency (HCC)   Relevant Medications   metoprolol  succinate (TOPROL -XL) 25 MG 24 hr tablet   apixaban  (ELIQUIS ) 5 MG TABS tablet   Atherosclerosis of right carotid artery   Relevant Medications   metoprolol  succinate (TOPROL -XL) 25 MG 24 hr tablet   apixaban  (ELIQUIS ) 5 MG TABS tablet   Carotid stenosis   Relevant Medications   metoprolol  succinate (TOPROL -XL) 25 MG 24 hr tablet   apixaban  (ELIQUIS ) 5 MG TABS tablet   Essential hypertension   Relevant Medications   metoprolol  succinate (TOPROL -XL) 25 MG 24 hr tablet   apixaban  (ELIQUIS ) 5 MG TABS tablet   Iron deficiency anemia   Ovarian carcinoma, epithelial (HCC)   Other Visit Diagnoses       Annual physical exam    -  Primary     Needs flu shot         Gastrointestinal food allergy syndrome            Updated Health Maintenance information Reviewed recent lab results with patient, last done by Oncology team 03/2024. No recent labs due to  port placement Encouraged improvement to lifestyle with diet and exercise Goal of weight loss   Adult Wellness Visit Routine visit with stable weight and well-controlled blood pressure. No acute concerns. - Follow-up in 6 months.  Immunization counseling and administration Discussed flu and pneumonia vaccinations, including timing, side effects, and insurance coverage. She is considering spacing out vaccines. - Recommend Flu vaccine. She will check version w/ allergy risk - Consider pneumonia vaccine (Prevnar 20) and verify insurance coverage and possible allergy risk - Consult with alpha-gal specialist regarding vaccine safety. She will contact her [facility] alpha gal specialist  Essential hypertension Blood pressure well-controlled on current regimen. - Continue Metoprolol  XL half tab = 12.5mg  daily - Continue current antihypertensive medication regimen.  Ovarian Cancer Followed by [facility] for most of her specialists Including Oncology / GYN / Allergy/Immunology / GI - Surgery / Hematology  Hypertriglyceridemia Triglycerides elevated due to genetic factors. LDL cholesterol low. No immediate medication needed. - Continue monitoring triglyceride levels. - Not on Statin therapy  Aortic Atherosclerosis Carotid stenosis Not on Statin therapy.  On anticoagulation.  Alpha Gal Allergy Followed by [facility]  Vitamin B12 deficiency Vitamin B12 low. Discussed supplementation options considering alpha-gal syndrome. - Consult with alpha-gal specialist regarding B12 supplementation options. - Consider B12 injections if deemed safe.        No orders of the defined types were placed in this encounter.   Meds ordered this encounter  Medications   metoprolol  succinate (TOPROL -XL) 25 MG 24 hr tablet    Sig: Take 0.5 tablets (12.5 mg total) by mouth daily.    Dispense:  45 tablet    Refill:  3    Add extra refills   apixaban  (ELIQUIS ) 5 MG TABS tablet    Sig: Take 1  tablet (5 mg total) by mouth 2 (two) times daily.    Dispense:  180 tablet    Refill:  3    Add refills     Follow up plan: Return in about 6 months (around 12/09/2024) for 6 month Follow-up AM apt.  Marsa Officer,  DO [facility] 06/10/2024, 10:24 AM

## 2024-06-10 NOTE — Patient Instructions (Addendum)
 Thank you for coming to the office today.  Think about the vaccines and let us  know or check w/ Pharmacy  Flu Shot = Fluzone Trivalent vaccine  Pneumonia Shot = Prevnar-20 (1 dose, vaccine, good for 5+ years)  B12 injectable treatment 1000 mcg (chemical name is Cyanocobalamin)  Elderyberry for immunity  Turmeric for inflammation  Alpha Lipoic Acid ALA for neuropathy   Please schedule a Follow-up Appointment to: Return in about 6 months (around 12/09/2024) for 6 month Follow-up AM apt.  If you have any other questions or concerns, please feel free to call the office or send a message through MyChart. You may also schedule an earlier appointment if necessary.  Additionally, you may be receiving a survey about your experience at our office within a few days to 1 week by e-mail or mail. We value your feedback.  Marsa Officer, DO [facility], [place]

## 2024-06-14 ENCOUNTER — Other Ambulatory Visit: Payer: Self-pay | Admitting: Family Medicine

## 2024-06-14 DIAGNOSIS — I6521 Occlusion and stenosis of right carotid artery: Secondary | ICD-10-CM

## 2024-06-17 NOTE — Telephone Encounter (Signed)
 Requested Prescriptions  Refused Prescriptions Disp Refills   ELIQUIS  5 MG TABS tablet [Pharmacy Med Name: ELIQUIS  5MG  TABLETS] 180 tablet 3    Sig: TAKE 1 TABLET(5 MG) BY MOUTH TWICE DAILY     Hematology:  Anticoagulants - apixaban  Failed - 06/17/2024  2:00 PM      Failed - PLT in normal range and within 360 days    Platelets  Date Value Ref Range Status  08/25/2020 472 (H) 150 - 450 x10E3/uL Final         Failed - HGB in normal range and within 360 days    Hemoglobin  Date Value Ref Range Status  08/25/2020 9.7 (L) 11.1 - 15.9 g/dL Final         Failed - HCT in normal range and within 360 days    Hematocrit  Date Value Ref Range Status  08/25/2020 32.1 (L) 34.0 - 46.6 % Final         Failed - Cr in normal range and within 360 days    Creatinine, Ser  Date Value Ref Range Status  08/13/2020 0.49 (L) 0.57 - 1.00 mg/dL Final         Failed - AST in normal range and within 360 days    AST  Date Value Ref Range Status  08/13/2020 28 0 - 40 IU/L Final   AST (SGOT) Piccolo, Waived  Date Value Ref Range Status  05/07/2018 33 11 - 38 U/L Final         Failed - ALT in normal range and within 360 days    ALT  Date Value Ref Range Status  08/13/2020 27 0 - 32 IU/L Final   ALT (SGPT) Piccolo, Waived  Date Value Ref Range Status  05/07/2018 26 10 - 47 U/L Final         Passed - Valid encounter within last 12 months    Recent Outpatient Visits           1 week ago Annual physical exam   [facility] [place], Marsa PARAS, DO   6 months ago Fecal urgency   [facility] [place], Marsa PARAS, [place]

## 2024-06-27 ENCOUNTER — Ambulatory Visit (INDEPENDENT_AMBULATORY_CARE_PROVIDER_SITE_OTHER)

## 2024-06-27 DIAGNOSIS — Z23 Encounter for immunization: Secondary | ICD-10-CM | POA: Diagnosis not present

## 2024-06-27 NOTE — Progress Notes (Signed)
 [id number]

## 2024-12-09 ENCOUNTER — Ambulatory Visit: Admitting: Family Medicine
